# Patient Record
Sex: Male | Born: 1935 | Race: Black or African American | Hispanic: No | Marital: Single | Smoking: Former smoker
Health system: Southern US, Community
[De-identification: ages and names within clinical notes are randomized; demographics above are authoritative.]

## PROBLEM LIST (undated history)

## (undated) DIAGNOSIS — D649 Anemia, unspecified: Secondary | ICD-10-CM

## (undated) DIAGNOSIS — A048 Other specified bacterial intestinal infections: Secondary | ICD-10-CM

## (undated) DIAGNOSIS — H409 Unspecified glaucoma: Secondary | ICD-10-CM

## (undated) DIAGNOSIS — Z9289 Personal history of other medical treatment: Secondary | ICD-10-CM

## (undated) DIAGNOSIS — I10 Essential (primary) hypertension: Secondary | ICD-10-CM

## (undated) DIAGNOSIS — E119 Type 2 diabetes mellitus without complications: Secondary | ICD-10-CM

## (undated) DIAGNOSIS — G4730 Sleep apnea, unspecified: Secondary | ICD-10-CM

## (undated) DIAGNOSIS — H25019 Cortical age-related cataract, unspecified eye: Secondary | ICD-10-CM

## (undated) DIAGNOSIS — E785 Hyperlipidemia, unspecified: Secondary | ICD-10-CM

## (undated) DIAGNOSIS — N400 Enlarged prostate without lower urinary tract symptoms: Secondary | ICD-10-CM

## (undated) DIAGNOSIS — K5520 Angiodysplasia of colon without hemorrhage: Secondary | ICD-10-CM

## (undated) DIAGNOSIS — K269 Duodenal ulcer, unspecified as acute or chronic, without hemorrhage or perforation: Secondary | ICD-10-CM

## (undated) DIAGNOSIS — J309 Allergic rhinitis, unspecified: Secondary | ICD-10-CM

## (undated) DIAGNOSIS — K635 Polyp of colon: Secondary | ICD-10-CM

## (undated) DIAGNOSIS — Z8719 Personal history of other diseases of the digestive system: Secondary | ICD-10-CM

## (undated) HISTORY — DX: Unspecified glaucoma: H40.9

## (undated) HISTORY — DX: Personal history of other diseases of the digestive system: Z87.19

## (undated) HISTORY — DX: Polyp of colon: K63.5

## (undated) HISTORY — DX: Sleep apnea, unspecified: G47.30

## (undated) HISTORY — DX: Duodenal ulcer, unspecified as acute or chronic, without hemorrhage or perforation: K26.9

## (undated) HISTORY — DX: Allergic rhinitis, unspecified: J30.9

## (undated) HISTORY — DX: Other specified bacterial intestinal infections: A04.8

## (undated) HISTORY — DX: Hyperlipidemia, unspecified: E78.5

## (undated) HISTORY — DX: Essential (primary) hypertension: I10

## (undated) HISTORY — DX: Type 2 diabetes mellitus without complications: E11.9

## (undated) HISTORY — DX: Benign prostatic hyperplasia without lower urinary tract symptoms: N40.0

## (undated) HISTORY — DX: Angiodysplasia of colon without hemorrhage: K55.20

## (undated) HISTORY — DX: Personal history of other medical treatment: Z92.89

## (undated) HISTORY — DX: Anemia, unspecified: D64.9

## (undated) HISTORY — PX: COLONOSCOPY: SHX174

## (undated) HISTORY — DX: Cortical age-related cataract, unspecified eye: H25.019

## (undated) HISTORY — PX: ESOPHAGOGASTRODUODENOSCOPY: SHX1529

## (undated) MED FILL — Iron Sucrose Inj 20 MG/ML (Fe Equiv): INTRAVENOUS | Qty: 10 | Status: AC

---

## 2005-02-07 DIAGNOSIS — Z9289 Personal history of other medical treatment: Secondary | ICD-10-CM

## 2005-02-07 DIAGNOSIS — Z8719 Personal history of other diseases of the digestive system: Secondary | ICD-10-CM

## 2005-02-07 HISTORY — DX: Personal history of other medical treatment: Z92.89

## 2005-02-07 HISTORY — DX: Personal history of other diseases of the digestive system: Z87.19

## 2005-02-14 ENCOUNTER — Inpatient Hospital Stay: Payer: Self-pay | Admitting: Internal Medicine

## 2005-03-22 ENCOUNTER — Ambulatory Visit: Payer: Self-pay | Admitting: Unknown Physician Specialty

## 2005-06-06 ENCOUNTER — Ambulatory Visit: Payer: Self-pay | Admitting: Family Medicine

## 2005-06-07 ENCOUNTER — Ambulatory Visit: Payer: Self-pay | Admitting: Family Medicine

## 2005-07-07 ENCOUNTER — Ambulatory Visit: Payer: Self-pay | Admitting: Family Medicine

## 2005-08-07 ENCOUNTER — Ambulatory Visit: Payer: Self-pay | Admitting: Family Medicine

## 2005-09-07 ENCOUNTER — Ambulatory Visit: Payer: Self-pay | Admitting: Family Medicine

## 2010-07-16 ENCOUNTER — Ambulatory Visit: Payer: Self-pay | Admitting: Unknown Physician Specialty

## 2010-08-17 ENCOUNTER — Ambulatory Visit: Payer: Self-pay | Admitting: Unknown Physician Specialty

## 2011-11-05 ENCOUNTER — Ambulatory Visit: Payer: Self-pay | Admitting: Nurse Practitioner

## 2011-12-26 ENCOUNTER — Ambulatory Visit: Payer: Self-pay | Admitting: Nurse Practitioner

## 2015-01-04 ENCOUNTER — Other Ambulatory Visit: Payer: Self-pay | Admitting: Family Medicine

## 2015-01-04 DIAGNOSIS — R6881 Early satiety: Secondary | ICD-10-CM

## 2015-01-06 ENCOUNTER — Ambulatory Visit
Admission: RE | Admit: 2015-01-06 | Discharge: 2015-01-06 | Disposition: A | Discharge status: Home / Self Care | Payer: Medicare Other | Source: Ambulatory Visit | Attending: Family Medicine | Admitting: Family Medicine

## 2015-01-06 DIAGNOSIS — R6881 Early satiety: Secondary | ICD-10-CM | POA: Insufficient documentation

## 2015-01-17 ENCOUNTER — Inpatient Hospital Stay: Payer: Medicare Other

## 2015-01-17 ENCOUNTER — Other Ambulatory Visit: Payer: Self-pay | Admitting: *Deleted

## 2015-01-17 ENCOUNTER — Encounter: Payer: Self-pay | Admitting: Internal Medicine

## 2015-01-17 ENCOUNTER — Inpatient Hospital Stay: Payer: Medicare Other | Attending: Internal Medicine | Admitting: Internal Medicine

## 2015-01-17 VITALS — BP 150/73 | HR 80 | Temp 97.8°F | Ht 66.0 in | Wt 185.8 lb

## 2015-01-17 DIAGNOSIS — Z8719 Personal history of other diseases of the digestive system: Secondary | ICD-10-CM | POA: Diagnosis not present

## 2015-01-17 DIAGNOSIS — D500 Iron deficiency anemia secondary to blood loss (chronic): Secondary | ICD-10-CM | POA: Insufficient documentation

## 2015-01-17 DIAGNOSIS — Z7984 Long term (current) use of oral hypoglycemic drugs: Secondary | ICD-10-CM | POA: Insufficient documentation

## 2015-01-17 DIAGNOSIS — E785 Hyperlipidemia, unspecified: Secondary | ICD-10-CM | POA: Diagnosis not present

## 2015-01-17 DIAGNOSIS — N400 Enlarged prostate without lower urinary tract symptoms: Secondary | ICD-10-CM | POA: Insufficient documentation

## 2015-01-17 DIAGNOSIS — C649 Malignant neoplasm of unspecified kidney, except renal pelvis: Secondary | ICD-10-CM | POA: Diagnosis not present

## 2015-01-17 DIAGNOSIS — K922 Gastrointestinal hemorrhage, unspecified: Secondary | ICD-10-CM | POA: Diagnosis not present

## 2015-01-17 DIAGNOSIS — E119 Type 2 diabetes mellitus without complications: Secondary | ICD-10-CM | POA: Diagnosis not present

## 2015-01-17 DIAGNOSIS — R5383 Other fatigue: Secondary | ICD-10-CM | POA: Diagnosis not present

## 2015-01-17 DIAGNOSIS — Z87891 Personal history of nicotine dependence: Secondary | ICD-10-CM | POA: Insufficient documentation

## 2015-01-17 DIAGNOSIS — I10 Essential (primary) hypertension: Secondary | ICD-10-CM | POA: Insufficient documentation

## 2015-01-17 DIAGNOSIS — D649 Anemia, unspecified: Secondary | ICD-10-CM | POA: Diagnosis not present

## 2015-01-17 DIAGNOSIS — Z79899 Other long term (current) drug therapy: Secondary | ICD-10-CM | POA: Insufficient documentation

## 2015-01-17 DIAGNOSIS — R0609 Other forms of dyspnea: Secondary | ICD-10-CM | POA: Insufficient documentation

## 2015-01-17 DIAGNOSIS — G4730 Sleep apnea, unspecified: Secondary | ICD-10-CM | POA: Insufficient documentation

## 2015-01-17 LAB — URINALYSIS COMPLETE WITH MICROSCOPIC (ARMC ONLY)
Bilirubin Urine: NEGATIVE
GLUCOSE, UA: NEGATIVE mg/dL
Hgb urine dipstick: NEGATIVE
KETONES UR: NEGATIVE mg/dL
LEUKOCYTES UA: NEGATIVE
NITRITE: NEGATIVE
Protein, ur: NEGATIVE mg/dL
SPECIFIC GRAVITY, URINE: 1.02 (ref 1.005–1.030)
Squamous Epithelial / LPF: NONE SEEN
pH: 5.5 (ref 5.0–8.0)

## 2015-01-17 LAB — COMPREHENSIVE METABOLIC PANEL
ALBUMIN: 3.9 g/dL (ref 3.5–5.0)
ALT: 12 U/L — ABNORMAL LOW (ref 17–63)
ANION GAP: 6 (ref 5–15)
AST: 21 U/L (ref 15–41)
Alkaline Phosphatase: 60 U/L (ref 38–126)
BILIRUBIN TOTAL: 0.5 mg/dL (ref 0.3–1.2)
BUN: 17 mg/dL (ref 6–20)
CO2: 27 mmol/L (ref 22–32)
Calcium: 9.5 mg/dL (ref 8.9–10.3)
Chloride: 105 mmol/L (ref 101–111)
Creatinine, Ser: 1.19 mg/dL (ref 0.61–1.24)
GFR calc Af Amer: >60 mL/min (ref >60)
GFR, EST NON AFRICAN AMERICAN: 56 mL/min — AB (ref >60)
GLUCOSE: 133 mg/dL — AB (ref 65–99)
POTASSIUM: 3.7 mmol/L (ref 3.5–5.1)
Sodium: 138 mmol/L (ref 135–145)
TOTAL PROTEIN: 7.8 g/dL (ref 6.5–8.1)

## 2015-01-17 LAB — IRON AND TIBC
Iron: 27 ug/dL — ABNORMAL LOW (ref 45–182)
Saturation Ratios: 6 % — ABNORMAL LOW (ref 17.9–39.5)
TIBC: 449 ug/dL (ref 250–450)
UIBC: 422 ug/dL

## 2015-01-17 LAB — CBC WITH DIFFERENTIAL/PLATELET
Basophils Absolute: 0.1 10*3/uL (ref 0–0.1)
Eosinophils Absolute: 0.1 10*3/uL (ref 0–0.7)
Eosinophils Relative: 2 %
HEMATOCRIT: 28.4 % — AB (ref 40.0–52.0)
Hemoglobin: 8.8 g/dL — ABNORMAL LOW (ref 13.0–18.0)
Lymphocytes Relative: 14 %
Lymphs Abs: 0.9 10*3/uL — ABNORMAL LOW (ref 1.0–3.6)
MCH: 23.2 pg — ABNORMAL LOW (ref 26.0–34.0)
MCHC: 31.1 g/dL — AB (ref 32.0–36.0)
MCV: 74.6 fL — ABNORMAL LOW (ref 80.0–100.0)
MONO ABS: 0.6 10*3/uL (ref 0.2–1.0)
NEUTROS ABS: 4.8 10*3/uL (ref 1.4–6.5)
Neutrophils Relative %: 73 %
Platelets: 314 10*3/uL (ref 150–440)
RBC: 3.81 MIL/uL — ABNORMAL LOW (ref 4.40–5.90)
RDW: 16.7 % — AB (ref 11.5–14.5)
WBC: 6.5 10*3/uL (ref 3.8–10.6)

## 2015-01-17 LAB — FOLATE: FOLATE: 6.1 ng/mL (ref >5.9)

## 2015-01-17 LAB — RETICULOCYTES
RBC.: 3.73 MIL/uL — AB (ref 4.40–5.90)
RETIC COUNT ABSOLUTE: 63.4 10*3/uL (ref 19.0–183.0)
RETIC CT PCT: 1.7 % (ref 0.4–3.1)

## 2015-01-17 LAB — LACTATE DEHYDROGENASE: LDH: 101 U/L (ref 98–192)

## 2015-01-17 LAB — FERRITIN: Ferritin: 7 ng/mL — ABNORMAL LOW (ref 24–336)

## 2015-01-17 NOTE — Progress Notes (Signed)
[facility] CONSULT NOTE  Patient Care Team: Sherrin Daisy, MD as PCP - General (Family Medicine)  CHIEF COMPLAINTS/PURPOSE OF CONSULTATION: ANEMIA # 2009-Intermittent ANEMIA- likely secondary to upper GI bleed; EGD [gastric ulcer & duodenal AV malformations] & Colonoscopy 2007&  2012 [Dr.Elliot]  HISTORY OF PRESENTING ILLNESS:  Tommy Heath 82 y.o.  male with prior history of upper GI bleed secondary to gastric ulcer/duodenal AV malformation [2007 and 2012 status post EGD] has been referred to [place] for worsening anemia. Patient had received blood transfusion in 2007 as his hemoglobin was as low as 6 at that time.  Patient has been on one iron pill a day. He has black colored stools chronic. He denies abdominal pain or reflux problems. Denies any bright red blood per rectum. No nausea no vomiting. No significant weight loss. No night sweats. No unusual chest pain or cough. He has worsening shortness of breath especially exertion. No swelling in the legs.  More recent Review of patient's blood counts shows- hemoglobin 11-18 June 2013; which has progressively gotten worse more recently in December 2016 hemoglobin dropped to 9. MCV-within normal limits. Normal white count and platelets. Patient has been referred to [place] for further recommendation/evaluation.  ROS: A complete 10 point review of system is done which is negative except mentioned above in history of present illness  MEDICAL HISTORY:  Past Medical History  Diagnosis Date  . Diabetes mellitus type 2, uncomplicated ([place])   . Hypertension   . Hyperlipidemia   . BPH (benign prostatic hyperplasia)   . Duodenal ulcer     w/ gastric ulcer  . H. pylori infection   . AVM (arteriovenous malformation) of colon   . Anemia   . Allergic rhinitis   . Adenomatous colon polyp   . Sleep apnea     BiPap  . Glaucoma   . History of blood product transfusion   . Cataract cortical, senile   . Colon polyps 02/2005, 2012    TUBULAR  ADENOMA  . History of blood transfusion 02/2005    hgb was 6  . History of duodenal ulcer 02/2005    The lesion was 5 mm in largest dimension.    SURGICAL HISTORY: Past Surgical History  Procedure Laterality Date  . Colonoscopy      07/2010, 09/09/2002, 10/15/2002, 02/16/2005, Adenomatous polyps, repeat 5 years, Dr Vira Agar  . Esophagogastroduodenoscopy      08/17/2010, 02/16/2005, no repeat per RTE    SOCIAL HISTORY: Social History   Social History  . Marital Status: Single    Spouse Name: N/A  . Number of Children: N/A  . Years of Education: N/A   Occupational History  . Not on file.   Social History Main Topics  . Smoking status: Former Smoker -- 1.00 packs/day for 40 years    Types: Cigarettes    Quit date: 01/07/1993  . Smokeless tobacco: Never Used  . Alcohol Use: No  . Drug Use: No  . Sexual Activity: No   Other Topics Concern  . Not on file   Social History Narrative  . No narrative on file    FAMILY HISTORY: History reviewed. No pertinent family history.  ALLERGIES:  has No Known Allergies.  MEDICATIONS:  Current Outpatient Prescriptions  Medication Sig Dispense Refill  . amLODipine (NORVASC) 10 MG tablet TAKE 1 TABLET DAILY    . atorvastatin (LIPITOR) 80 MG tablet TAKE 1 TABLET NIGHTLY    . Cyanocobalamin (RA VITAMIN B-12 TR) 1000 MCG TBCR Take by  mouth.    . ferrous sulfate 325 (65 FE) MG tablet Take by mouth.    Marland Kitchen glimepiride (AMARYL) 2 MG tablet TAKE 1 TABLET DAILY WITH BREAKFAST    . metFORMIN (GLUCOPHAGE) 1000 MG tablet Take by mouth.    Marland Kitchen omeprazole (PRILOSEC) 20 MG capsule TAKE 1 CAPSULE DAILY (CALL FOR APPOINTMENT)    . pioglitazone (ACTOS) 15 MG tablet TAKE 1 TABLET DAILY    . ramipril (ALTACE) 10 MG capsule TAKE 1 CAPSULE DAILY    . sitaGLIPtin (JANUVIA) 100 MG tablet Take by mouth.    . tamsulosin (FLOMAX) 0.4 MG CAPS capsule TAKE 1 CAPSULE DAILY    . triamcinolone (KENALOG) 0.025 % cream Apply topically.     No current facility-administered  medications for this visit.      Marland Kitchen  PHYSICAL EXAMINATION: ECOG PERFORMANCE STATUS: 1 - Symptomatic but completely ambulatory  Filed Vitals:   01/17/15 1358  BP: 150/73  Pulse: 80  Temp: 97.8 F (36.6 C)   Filed Weights   01/17/15 1358  Weight: 185 lb 13.6 oz (84.3 kg)    GENERAL: Well-nourished well-developed; Alert, no distress and comfortable.   Alone.  EYES: Positive for pallor no icterus. OROPHARYNX: no thrush or ulceration; good dentition  NECK: supple, no masses felt LYMPH:  no palpable lymphadenopathy in the cervical, axillary or inguinal regions LUNGS: clear to auscultation and  No wheeze or crackles HEART/CVS: regular rate & rhythm and no murmurs; No lower extremity edema ABDOMEN: abdomen soft, non-tender and normal bowel sounds Musculoskeletal:no cyanosis of digits and no clubbing  PSYCH: alert & oriented x 3 with fluent speech NEURO: no focal motor/sensory deficits SKIN:  no rashes or significant lesions  LABORATORY DATA:  I have reviewed the data as listed No results found for: WBC, HGB, HCT, MCV, PLT No results for input(s): NA, K, CL, CO2, GLUCOSE, BUN, CREATININE, CALCIUM, GFRNONAA, GFRAA, PROT, ALBUMIN, AST, ALT, ALKPHOS, BILITOT, BILIDIR, IBILI in the last 8760 hours.  RADIOGRAPHIC STUDIES: I have personally reviewed the radiological images as listed and agreed with the findings in the report. Dg Esophagus  01/06/2015  CLINICAL DATA:  Early satiety. EXAM: ESOPHOGRAM / BARIUM SWALLOW / BARIUM TABLET STUDY TECHNIQUE: Combined double contrast and single contrast examination performed using effervescent crystals, thick barium liquid, and thin barium liquid. The patient was observed with fluoroscopy swallowing a 13 mm barium sulphate tablet. FLUOROSCOPY TIME:  Radiation Exposure Index (as provided by the fluoroscopic device): 10.7 mGy COMPARISON:  None. FINDINGS: There was normal pharyngeal anatomy and motility. Contrast flowed freely through the esophagus  without evidence of stricture or mass. There was normal esophageal mucosa without evidence of irregularity or ulceration. Esophageal motility was normal. No evidence of reflux. No definite hiatal hernia was demonstrated. At the end of the examination a 13 mm barium tablet was administered which transited through the esophagus and esophagogastric junction without delay. IMPRESSION: Normal barium swallow. Electronically Signed   By: Kathreen Devoid   On: 01/06/2015 15:21    ASSESSMENT & PLAN:   # Worsening anemia- more recently hemoglobin of 9 MCV normal. I suspect patient has recurrent chronic GI bleeding issues. However other causes [place] to rule out. I recommend checking CBC CMP LDH and reticulocyte count [id number] folic acid; also SIEP/free light chain ratio haptoglobin.  # If patient has truly iron deficiency- I would recommend IV iron infusion. Tentatively order for Venofer 300 mg over 90 minutes has been ordered for next week.  # If again iron deficiency is  confirmed- patient is to have an evaluation with GI/for repeat/possible endoscopies.  All questions were answered. The patient knows to call the clinic with any problems, questions or concerns. Patient will follow-up with me in approximately 1 week.  # 30 minutes face-to-face with the patient discussing the above plan of care; more than 50% of time spent on prognosis/ natural history; counseling and coordination.   Thank you Dr.Virk for allowing me to participate in the care of your pleasant patient. Please do not hesitate to contact me with questions or concerns in the interim.     Cammie Sickle, MD 01/17/2015 2:43 PM

## 2015-01-17 NOTE — Progress Notes (Signed)
Family/social history-  No history of any malignancies in the family.  Remote history of smoking.  No alcohol abuse. Patient  Lives at home.

## 2015-01-18 LAB — IMMUNOFIXATION ELECTROPHORESIS
IGM, SERUM: 73 mg/dL (ref 15–143)
IgA: 252 mg/dL (ref 61–437)
IgG (Immunoglobin G), Serum: 1492 mg/dL (ref 700–1600)
Total Protein ELP: 7 g/dL (ref 6.0–8.5)

## 2015-01-18 LAB — MULTIPLE MYELOMA PANEL, SERUM
ALBUMIN SERPL ELPH-MCNC: 3.4 g/dL (ref 2.9–4.4)
ALPHA 1: 0.2 g/dL (ref 0.0–0.4)
Albumin/Glob SerPl: 1.1 (ref 0.7–1.7)
Alpha2 Glob SerPl Elph-Mcnc: 0.7 g/dL (ref 0.4–1.0)
B-Globulin SerPl Elph-Mcnc: 1 g/dL (ref 0.7–1.3)
GAMMA GLOB SERPL ELPH-MCNC: 1.4 g/dL (ref 0.4–1.8)
Globulin, Total: 3.4 g/dL (ref 2.2–3.9)
IGA: 251 mg/dL (ref 61–437)
IGM, SERUM: 71 mg/dL (ref 15–143)
IgG (Immunoglobin G), Serum: 1462 mg/dL (ref 700–1600)
TOTAL PROTEIN ELP: 6.8 g/dL (ref 6.0–8.5)

## 2015-01-18 LAB — SOLUBLE TRANSFERRIN RECEPTOR: TRANSFERRIN RECEPTOR: 48.8 nmol/L — AB (ref 12.2–27.3)

## 2015-01-18 LAB — VITAMIN B12: VITAMIN B 12: 574 pg/mL (ref 180–914)

## 2015-01-18 LAB — KAPPA/LAMBDA LIGHT CHAINS
KAPPA FREE LGHT CHN: 37.2 mg/L — AB (ref 3.30–19.40)
KAPPA, LAMDA LIGHT CHAIN RATIO: 1.44 (ref 0.26–1.65)
LAMDA FREE LIGHT CHAINS: 25.76 mg/L (ref 5.71–26.30)

## 2015-01-18 LAB — HAPTOGLOBIN: Haptoglobin: 226 mg/dL — ABNORMAL HIGH (ref 34–200)

## 2015-01-24 ENCOUNTER — Inpatient Hospital Stay: Payer: Medicare Other

## 2015-01-24 ENCOUNTER — Inpatient Hospital Stay (HOSPITAL_BASED_OUTPATIENT_CLINIC_OR_DEPARTMENT_OTHER): Payer: Medicare Other | Admitting: Internal Medicine

## 2015-01-24 VITALS — BP 157/68 | HR 79 | Temp 98.1°F | Resp 18 | Wt 185.8 lb

## 2015-01-24 VITALS — BP 143/71 | HR 67 | Temp 96.7°F | Resp 20

## 2015-01-24 DIAGNOSIS — K922 Gastrointestinal hemorrhage, unspecified: Secondary | ICD-10-CM | POA: Diagnosis not present

## 2015-01-24 DIAGNOSIS — N400 Enlarged prostate without lower urinary tract symptoms: Secondary | ICD-10-CM

## 2015-01-24 DIAGNOSIS — D509 Iron deficiency anemia, unspecified: Secondary | ICD-10-CM

## 2015-01-24 DIAGNOSIS — Z87891 Personal history of nicotine dependence: Secondary | ICD-10-CM

## 2015-01-24 DIAGNOSIS — D500 Iron deficiency anemia secondary to blood loss (chronic): Secondary | ICD-10-CM

## 2015-01-24 DIAGNOSIS — Z79899 Other long term (current) drug therapy: Secondary | ICD-10-CM

## 2015-01-24 DIAGNOSIS — E785 Hyperlipidemia, unspecified: Secondary | ICD-10-CM

## 2015-01-24 DIAGNOSIS — R0609 Other forms of dyspnea: Secondary | ICD-10-CM

## 2015-01-24 DIAGNOSIS — E119 Type 2 diabetes mellitus without complications: Secondary | ICD-10-CM

## 2015-01-24 DIAGNOSIS — R5383 Other fatigue: Secondary | ICD-10-CM | POA: Diagnosis not present

## 2015-01-24 DIAGNOSIS — I10 Essential (primary) hypertension: Secondary | ICD-10-CM

## 2015-01-24 DIAGNOSIS — Z7984 Long term (current) use of oral hypoglycemic drugs: Secondary | ICD-10-CM

## 2015-01-24 MED ORDER — SODIUM CHLORIDE 0.9 % IV SOLN
300.0000 mg | Freq: Once | INTRAVENOUS | Status: AC
  Administered 2015-01-24: 300 mg via INTRAVENOUS
  Filled 2015-01-24: qty 15

## 2015-01-24 MED ORDER — SODIUM CHLORIDE 0.9 % IV SOLN
Freq: Once | INTRAVENOUS | Status: AC
  Administered 2015-01-24: 09:00:00 via INTRAVENOUS
  Filled 2015-01-24: qty 1000

## 2015-01-24 NOTE — Progress Notes (Signed)
Pt here for lab results and to receive venofer as hgb is 8.8 and ferritin is 7. Pt reports low energy and low appetite. Fast food odors make him feel sick. He is able to eat meats and greens, he states he "makes himself eat". Denies blood in stools. He has freq diarrhea from his metformin. Denies dyspnea. Denies pain.Marland Kitchen

## 2015-01-24 NOTE — Progress Notes (Signed)
[facility] OFFICE PROGRESS NOTE  Patient Care Team: Sherrin Daisy, MD as PCP - General (Family Medicine)   SUMMARY OF ONCOLOGIC HISTORY:  # 2009- IRON DEFICIENCY ANEMIA-likely secondary to upper GI bleed; EGD [gastric ulcer & duodenal AV malformations] & Colonoscopy 2007& 2012 [Dr.Elliot]; JAN 2017- Hb-8.8/Ferritin-7  INTERVAL HISTORY:  82 year old male patient with with above history of iron deficiency anemia likely from chronic GI blood loss is here to review the results of his repeat blood work that was done last visit for worsening anemia.  He continues to feel tired; short of breath on exertion. Denies any swelling in the legs. Denies any cough or nausea vomiting. Denies any blood in stools. He has black stools [on by mouth iron]. No abdominal pain or weight loss.  REVIEW OF SYSTEMS:  A complete 10 point review of system is done which is negative except mentioned above/history of present illness.   PAST MEDICAL HISTORY :  Past Medical History  Diagnosis Date  . Diabetes mellitus type 2, uncomplicated ([place])   . Hypertension   . Hyperlipidemia   . BPH (benign prostatic hyperplasia)   . Duodenal ulcer     w/ gastric ulcer  . H. pylori infection   . AVM (arteriovenous malformation) of colon   . Anemia   . Allergic rhinitis   . Adenomatous colon polyp   . Sleep apnea     BiPap  . Glaucoma   . History of blood product transfusion   . Cataract cortical, senile   . Colon polyps 02/2005, 2012    TUBULAR ADENOMA  . History of blood transfusion 02/2005    hgb was 6  . History of duodenal ulcer 02/2005    The lesion was 5 mm in largest dimension.    PAST SURGICAL HISTORY :   Past Surgical History  Procedure Laterality Date  . Colonoscopy      07/2010, 09/09/2002, 10/15/2002, 02/16/2005, Adenomatous polyps, repeat 5 years, Dr Vira Agar  . Esophagogastroduodenoscopy      08/17/2010, 02/16/2005, no repeat per RTE    FAMILY HISTORY :  No family history on  file.  SOCIAL HISTORY:   Social History  Substance Use Topics  . Smoking status: Former Smoker -- 1.00 packs/day for 40 years    Types: Cigarettes    Quit date: 01/07/1993  . Smokeless tobacco: Never Used  . Alcohol Use: No    ALLERGIES:  has No Known Allergies.  MEDICATIONS:  Current Outpatient Prescriptions  Medication Sig Dispense Refill  . amLODipine (NORVASC) 10 MG tablet TAKE 1 TABLET DAILY    . atorvastatin (LIPITOR) 80 MG tablet TAKE 1 TABLET NIGHTLY    . Cyanocobalamin (RA VITAMIN B-12 TR) 1000 MCG TBCR Take by mouth.    . ferrous sulfate 325 (65 FE) MG tablet Take by mouth.    Marland Kitchen glimepiride (AMARYL) 2 MG tablet TAKE 1 TABLET DAILY WITH BREAKFAST    . metFORMIN (GLUCOPHAGE) 1000 MG tablet Take by mouth.    Marland Kitchen omeprazole (PRILOSEC) 20 MG capsule TAKE 1 CAPSULE DAILY (CALL FOR APPOINTMENT)    . pioglitazone (ACTOS) 15 MG tablet TAKE 1 TABLET DAILY    . ramipril (ALTACE) 10 MG capsule TAKE 1 CAPSULE DAILY    . sitaGLIPtin (JANUVIA) 100 MG tablet Take by mouth.    . tamsulosin (FLOMAX) 0.4 MG CAPS capsule TAKE 1 CAPSULE DAILY    . triamcinolone (KENALOG) 0.025 % cream Apply topically.     No current facility-administered medications for this visit.  Facility-Administered Medications Ordered in Other Visits  Medication Dose Route Frequency Provider Last Rate Last Dose  . 0.9 %  sodium chloride infusion   Intravenous Once Cammie Sickle, MD      . iron sucrose (VENOFER) 300 mg in sodium chloride 0.9 % 250 mL IVPB  300 mg Intravenous Once Cammie Sickle, MD        PHYSICAL EXAMINATION:   BP 157/68 mmHg  Pulse 79  Temp(Src) 98.1 F (36.7 C) (Oral)  Resp 18  Wt 185 lb 13.6 oz (84.3 kg)  SpO2 99%  Filed Weights   01/24/15 0841  Weight: 185 lb 13.6 oz (84.3 kg)    GENERAL: Well-nourished well-developed; Alert, no distress and comfortable.   He appears pale.  LABORATORY DATA:  I have reviewed the data as listed    Component Value Date/Time   NA 138  01/17/2015 1439   K 3.7 01/17/2015 1439   CL 105 01/17/2015 1439   CO2 27 01/17/2015 1439   GLUCOSE 133* 01/17/2015 1439   BUN 17 01/17/2015 1439   CREATININE 1.19 01/17/2015 1439   CALCIUM 9.5 01/17/2015 1439   PROT 7.8 01/17/2015 1439   ALBUMIN 3.9 01/17/2015 1439   AST 21 01/17/2015 1439   ALT 12* 01/17/2015 1439   ALKPHOS 60 01/17/2015 1439   BILITOT 0.5 01/17/2015 1439   GFRNONAA 56* 01/17/2015 1439   GFRAA >60 01/17/2015 1439    No results found for: SPEP, UPEP  Lab Results  Component Value Date   WBC 6.5 01/17/2015   NEUTROABS 4.8 01/17/2015   HGB 8.8* 01/17/2015   HCT 28.4* 01/17/2015   MCV 74.6* 01/17/2015   PLT 314 01/17/2015      Chemistry      Component Value Date/Time   NA 138 01/17/2015 1439   K 3.7 01/17/2015 1439   CL 105 01/17/2015 1439   CO2 27 01/17/2015 1439   BUN 17 01/17/2015 1439   CREATININE 1.19 01/17/2015 1439      Component Value Date/Time   CALCIUM 9.5 01/17/2015 1439   ALKPHOS 60 01/17/2015 1439   AST 21 01/17/2015 1439   ALT 12* 01/17/2015 1439   BILITOT 0.5 01/17/2015 1439       RADIOGRAPHIC STUDIES: I have personally reviewed the radiological images as listed and agreed with the findings in the report. No results found.   ASSESSMENT & PLAN:   # Severe Iron deficiency Anemia- likely from chronic GI blood loss. Previous history of gastric ulcer/duodenal AV malformations. Patient's hemoglobin is 8.8 ferritin 7; I would recommend IV iron transfusion Venofer weekly 4. Repeat CBC in 4 weeks; follow up with me in 8 weeks with CBC ferritin.   # I have asked the patient to call his gastroenterologist Dr. Tiffany Kocher to make an appointment for further evaluation of his iron deficiency anemia. I have paged Dr. Tiffany Kocher to speak to him regarding the above iron deficiency/Need for further workup.  Orders Placed This Encounter  Procedures  . Ambulatory referral to Gastroenterology    Referral Priority:  Urgent    Referral Type:  Consultation     Referral Reason:  Specialty Services Required    Referred to Provider:  Manya Silvas, MD    Requested Specialty:  Gastroenterology    Number of Visits Requested:  1   # 15 minutes face-to-face with the patient discussing the above plan of care; more than 50% of time spent on prognosis/ natural history; counseling and coordination.     Lenetta Quaker  Ann Lions, MD 01/24/2015 8:50 AM

## 2015-01-24 NOTE — Progress Notes (Signed)
Spoke to [place] re: IDA; he will follow. Dr.B

## 2015-01-31 ENCOUNTER — Other Ambulatory Visit: Payer: Self-pay | Admitting: Internal Medicine

## 2015-01-31 ENCOUNTER — Inpatient Hospital Stay: Payer: Medicare Other

## 2015-01-31 VITALS — BP 133/63 | HR 69 | Temp 97.5°F | Resp 20

## 2015-01-31 DIAGNOSIS — D500 Iron deficiency anemia secondary to blood loss (chronic): Secondary | ICD-10-CM

## 2015-01-31 MED ORDER — SODIUM CHLORIDE 0.9 % IV SOLN
300.0000 mg | Freq: Once | INTRAVENOUS | Status: AC
  Administered 2015-01-31: 300 mg via INTRAVENOUS
  Filled 2015-01-31: qty 15

## 2015-01-31 MED ORDER — SODIUM CHLORIDE 0.9 % IV SOLN
Freq: Once | INTRAVENOUS | Status: AC
  Administered 2015-01-31: 10:00:00 via INTRAVENOUS
  Filled 2015-01-31: qty 1000

## 2015-02-07 ENCOUNTER — Inpatient Hospital Stay: Payer: Medicare Other

## 2015-02-07 VITALS — BP 136/64 | HR 65 | Temp 97.7°F | Resp 20

## 2015-02-07 DIAGNOSIS — D500 Iron deficiency anemia secondary to blood loss (chronic): Secondary | ICD-10-CM

## 2015-02-07 MED ORDER — SODIUM CHLORIDE 0.9 % IV SOLN
300.0000 mg | Freq: Once | INTRAVENOUS | Status: AC
  Administered 2015-02-07: 300 mg via INTRAVENOUS
  Filled 2015-02-07: qty 15

## 2015-02-07 MED ORDER — SODIUM CHLORIDE 0.9 % IV SOLN
Freq: Once | INTRAVENOUS | Status: AC
  Administered 2015-02-07: 11:00:00 via INTRAVENOUS
  Filled 2015-02-07: qty 1000

## 2015-02-14 ENCOUNTER — Inpatient Hospital Stay: Payer: Medicare Other | Attending: Internal Medicine

## 2015-02-14 ENCOUNTER — Inpatient Hospital Stay: Payer: Medicare Other

## 2015-02-14 VITALS — BP 123/57 | HR 81 | Temp 97.4°F

## 2015-02-14 DIAGNOSIS — D500 Iron deficiency anemia secondary to blood loss (chronic): Secondary | ICD-10-CM

## 2015-02-14 DIAGNOSIS — D509 Iron deficiency anemia, unspecified: Secondary | ICD-10-CM | POA: Insufficient documentation

## 2015-02-14 LAB — CBC WITH DIFFERENTIAL/PLATELET
BASOS ABS: 0.1 10*3/uL (ref 0–0.1)
Basophils Relative: 1 %
EOS ABS: 0.1 10*3/uL (ref 0–0.7)
EOS PCT: 2 %
HCT: 31.4 % — ABNORMAL LOW (ref 40.0–52.0)
Hemoglobin: 9.9 g/dL — ABNORMAL LOW (ref 13.0–18.0)
Lymphocytes Relative: 20 %
Lymphs Abs: 1.4 10*3/uL (ref 1.0–3.6)
MCH: 24.6 pg — ABNORMAL LOW (ref 26.0–34.0)
MCHC: 31.5 g/dL — ABNORMAL LOW (ref 32.0–36.0)
MCV: 78.1 fL — AB (ref 80.0–100.0)
MONO ABS: 0.6 10*3/uL (ref 0.2–1.0)
Monocytes Relative: 8 %
Neutro Abs: 5 10*3/uL (ref 1.4–6.5)
Neutrophils Relative %: 69 %
PLATELETS: 321 10*3/uL (ref 150–440)
RBC: 4.02 MIL/uL — AB (ref 4.40–5.90)
RDW: 23.6 % — AB (ref 11.5–14.5)
WBC: 7.2 10*3/uL (ref 3.8–10.6)

## 2015-02-14 MED ORDER — SODIUM CHLORIDE 0.9 % IV SOLN
Freq: Once | INTRAVENOUS | Status: AC
  Administered 2015-02-14: 11:00:00 via INTRAVENOUS
  Filled 2015-02-14: qty 1000

## 2015-02-14 MED ORDER — IRON SUCROSE 20 MG/ML IV SOLN
300.0000 mg | Freq: Once | INTRAVENOUS | Status: AC
  Administered 2015-02-14: 300 mg via INTRAVENOUS
  Filled 2015-02-14: qty 15

## 2015-03-01 ENCOUNTER — Encounter: Payer: Self-pay | Admitting: *Deleted

## 2015-03-01 ENCOUNTER — Ambulatory Visit: Payer: Medicare Other | Admitting: Anesthesiology

## 2015-03-01 ENCOUNTER — Ambulatory Visit
Admission: RE | Admit: 2015-03-01 | Discharge: 2015-03-01 | Disposition: A | Discharge status: Home / Self Care | Payer: Medicare Other | Source: Ambulatory Visit | Attending: Unknown Physician Specialty | Admitting: Unknown Physician Specialty

## 2015-03-01 ENCOUNTER — Encounter
Admission: RE | Disposition: A | Discharge status: Home / Self Care | Payer: Self-pay | Source: Ambulatory Visit | Attending: Unknown Physician Specialty

## 2015-03-01 DIAGNOSIS — Z9889 Other specified postprocedural states: Secondary | ICD-10-CM | POA: Diagnosis not present

## 2015-03-01 DIAGNOSIS — Z79899 Other long term (current) drug therapy: Secondary | ICD-10-CM | POA: Diagnosis not present

## 2015-03-01 DIAGNOSIS — K2970 Gastritis, unspecified, without bleeding: Secondary | ICD-10-CM | POA: Diagnosis not present

## 2015-03-01 DIAGNOSIS — Z7984 Long term (current) use of oral hypoglycemic drugs: Secondary | ICD-10-CM | POA: Diagnosis not present

## 2015-03-01 DIAGNOSIS — Q2733 Arteriovenous malformation of digestive system vessel: Secondary | ICD-10-CM | POA: Diagnosis not present

## 2015-03-01 DIAGNOSIS — N400 Enlarged prostate without lower urinary tract symptoms: Secondary | ICD-10-CM | POA: Insufficient documentation

## 2015-03-01 DIAGNOSIS — K641 Second degree hemorrhoids: Secondary | ICD-10-CM | POA: Insufficient documentation

## 2015-03-01 DIAGNOSIS — Z8719 Personal history of other diseases of the digestive system: Secondary | ICD-10-CM | POA: Insufficient documentation

## 2015-03-01 DIAGNOSIS — K3189 Other diseases of stomach and duodenum: Secondary | ICD-10-CM | POA: Diagnosis not present

## 2015-03-01 DIAGNOSIS — K449 Diaphragmatic hernia without obstruction or gangrene: Secondary | ICD-10-CM | POA: Insufficient documentation

## 2015-03-01 DIAGNOSIS — G4730 Sleep apnea, unspecified: Secondary | ICD-10-CM | POA: Diagnosis not present

## 2015-03-01 DIAGNOSIS — D509 Iron deficiency anemia, unspecified: Secondary | ICD-10-CM | POA: Insufficient documentation

## 2015-03-01 DIAGNOSIS — Z86010 Personal history of colonic polyps: Secondary | ICD-10-CM | POA: Diagnosis not present

## 2015-03-01 DIAGNOSIS — K5520 Angiodysplasia of colon without hemorrhage: Secondary | ICD-10-CM | POA: Diagnosis not present

## 2015-03-01 DIAGNOSIS — D124 Benign neoplasm of descending colon: Secondary | ICD-10-CM | POA: Insufficient documentation

## 2015-03-01 DIAGNOSIS — E785 Hyperlipidemia, unspecified: Secondary | ICD-10-CM | POA: Diagnosis not present

## 2015-03-01 DIAGNOSIS — E119 Type 2 diabetes mellitus without complications: Secondary | ICD-10-CM | POA: Diagnosis not present

## 2015-03-01 DIAGNOSIS — D649 Anemia, unspecified: Secondary | ICD-10-CM | POA: Insufficient documentation

## 2015-03-01 DIAGNOSIS — R197 Diarrhea, unspecified: Secondary | ICD-10-CM | POA: Insufficient documentation

## 2015-03-01 DIAGNOSIS — Z833 Family history of diabetes mellitus: Secondary | ICD-10-CM | POA: Insufficient documentation

## 2015-03-01 DIAGNOSIS — Z87891 Personal history of nicotine dependence: Secondary | ICD-10-CM | POA: Diagnosis not present

## 2015-03-01 DIAGNOSIS — K5730 Diverticulosis of large intestine without perforation or abscess without bleeding: Secondary | ICD-10-CM | POA: Insufficient documentation

## 2015-03-01 DIAGNOSIS — D508 Other iron deficiency anemias: Secondary | ICD-10-CM | POA: Diagnosis present

## 2015-03-01 DIAGNOSIS — I10 Essential (primary) hypertension: Secondary | ICD-10-CM | POA: Diagnosis not present

## 2015-03-01 HISTORY — PX: ESOPHAGOGASTRODUODENOSCOPY (EGD) WITH PROPOFOL: SHX5813

## 2015-03-01 HISTORY — PX: COLONOSCOPY WITH PROPOFOL: SHX5780

## 2015-03-01 LAB — GLUCOSE, CAPILLARY: Glucose-Capillary: 119 mg/dL — ABNORMAL HIGH (ref 65–99)

## 2015-03-01 SURGERY — COLONOSCOPY WITH PROPOFOL
Anesthesia: General

## 2015-03-01 MED ORDER — SODIUM CHLORIDE 0.9 % IV SOLN: INTRAVENOUS | Status: DC

## 2015-03-01 MED ORDER — PROPOFOL 10 MG/ML IV BOLUS
INTRAVENOUS | Status: DC | PRN
  Administered 2015-03-01: 10 mg via INTRAVENOUS
  Administered 2015-03-01: 20 mg via INTRAVENOUS
  Administered 2015-03-01 (×3): 10 mg via INTRAVENOUS
  Administered 2015-03-01: 20 mg via INTRAVENOUS

## 2015-03-01 MED ORDER — BUTAMBEN-TETRACAINE-BENZOCAINE 2-2-14 % EX AERO
INHALATION_SPRAY | CUTANEOUS | Status: DC | PRN
  Administered 2015-03-01: 1 via TOPICAL

## 2015-03-01 MED ORDER — SODIUM CHLORIDE 0.9 % IV SOLN
INTRAVENOUS | Status: DC
  Administered 2015-03-01: 1000 mL via INTRAVENOUS

## 2015-03-01 MED ORDER — PROPOFOL 500 MG/50ML IV EMUL
INTRAVENOUS | Status: DC | PRN
  Administered 2015-03-01: 75 ug/kg/min via INTRAVENOUS

## 2015-03-01 NOTE — Transfer of Care (Signed)
Immediate Anesthesia Transfer of Care Note  Patient: Tommy Heath  Procedure(s) Performed: Procedure(s): COLONOSCOPY WITH PROPOFOL (N/A) ESOPHAGOGASTRODUODENOSCOPY (EGD) WITH PROPOFOL (N/A)  Patient Location: PACU  Anesthesia Type:General  Level of Consciousness: awake, alert  and oriented  Airway & Oxygen Therapy: Patient Spontanous Breathing and Patient connected to nasal cannula oxygen  Post-op Assessment: Report given to RN and Post -op Vital signs reviewed and stable  Post vital signs: Reviewed and stable  Last Vitals:  Filed Vitals:   03/01/15 0850  BP: 144/73  Pulse: 71  Temp: 35.9 C  Resp: 18    Complications: No apparent anesthesia complications

## 2015-03-01 NOTE — H&P (Signed)
Primary Care Physician:  Sherrin Daisy, MD Primary Gastroenterologist:  Dr. Vira Agar  Pre-Procedure History & Physical: HPI:  Tommy Heath is a 82 y.o. male is here for an endoscopy and colonoscopy.   Past Medical History  Diagnosis Date  . Diabetes mellitus type 2, uncomplicated ([place])   . Hypertension   . Hyperlipidemia   . BPH (benign prostatic hyperplasia)   . Duodenal ulcer     w/ gastric ulcer  . H. pylori infection   . AVM (arteriovenous malformation) of colon   . Anemia   . Allergic rhinitis   . Adenomatous colon polyp   . Sleep apnea     BiPap  . Glaucoma   . History of blood product transfusion   . Cataract cortical, senile   . Colon polyps 02/2005, 2012    TUBULAR ADENOMA  . History of blood transfusion 02/2005    hgb was 6  . History of duodenal ulcer 02/2005    The lesion was 5 mm in largest dimension.    Past Surgical History  Procedure Laterality Date  . Colonoscopy      07/2010, 09/09/2002, 10/15/2002, 02/16/2005, Adenomatous polyps, repeat 5 years, Dr Vira Agar  . Esophagogastroduodenoscopy      08/17/2010, 02/16/2005, no repeat per RTE    Prior to Admission medications   Medication Sig Start Date End Date Taking? Authorizing Provider  amLODipine (NORVASC) 10 MG tablet TAKE 1 TABLET DAILY 07/27/14   Historical Provider, MD  atorvastatin (LIPITOR) 80 MG tablet TAKE 1 TABLET NIGHTLY 07/19/14   Historical Provider, MD  Cyanocobalamin (RA VITAMIN B-12 TR) 1000 MCG TBCR Take by mouth.    Historical Provider, MD  ferrous sulfate 325 (65 FE) MG tablet Take by mouth.    Historical Provider, MD  glimepiride (AMARYL) 2 MG tablet TAKE 1 TABLET DAILY WITH BREAKFAST 12/22/14   Historical Provider, MD  metFORMIN (GLUCOPHAGE) 1000 MG tablet Take by mouth. 12/27/14   Historical Provider, MD  omeprazole (PRILOSEC) 20 MG capsule TAKE 1 CAPSULE DAILY (CALL FOR APPOINTMENT) 07/19/14   Historical Provider, MD  pioglitazone (ACTOS) 15 MG tablet TAKE 1 TABLET DAILY 12/12/14   Historical  Provider, MD  ramipril (ALTACE) 10 MG capsule TAKE 1 CAPSULE DAILY 07/27/14   Historical Provider, MD  sitaGLIPtin (JANUVIA) 100 MG tablet Take by mouth. 06/28/14   Historical Provider, MD  tamsulosin (FLOMAX) 0.4 MG CAPS capsule TAKE 1 CAPSULE DAILY 12/08/14   Historical Provider, MD    Allergies as of 02/24/2015  . (No Known Allergies)    History reviewed. No pertinent family history.  Social History   Social History  . Marital Status: Single    Spouse Name: N/A  . Number of Children: N/A  . Years of Education: N/A   Occupational History  . Not on file.   Social History Main Topics  . Smoking status: Former Smoker -- 1.00 packs/day for 40 years    Types: Cigarettes    Quit date: 01/07/1993  . Smokeless tobacco: Never Used  . Alcohol Use: No  . Drug Use: No  . Sexual Activity: No   Other Topics Concern  . Not on file   Social History Narrative    Review of Systems: See HPI, otherwise negative ROS  Physical Exam: BP 144/73 mmHg  Pulse 71  Temp(Src) 96.7 F (35.9 C) (Tympanic)  Resp 18  Ht 5' 6.5" (1.689 m)  Wt 82.555 kg (182 lb)  BMI 28.94 kg/m2  SpO2 100% General:   Alert,  pleasant  and cooperative in NAD Head:  Normocephalic and atraumatic. Neck:  Supple; no masses or thyromegaly. Lungs:  Clear throughout to auscultation.    Heart:  Regular rate and rhythm. Abdomen:  Soft, nontender and nondistended. Normal bowel sounds, without guarding, and without rebound.   Neurologic:  Alert and  oriented x4;  grossly normal neurologically.  Impression/Plan: Tommy Heath is here for an endoscopy and colonoscopy to be performed for iron def anemia, previous AVM of stomach and duodenum.  Risks, benefits, limitations, and alternatives regarding  endoscopy and colonoscopy, have been reviewed with the patient.  Questions have been answered.  All parties agreeable.   Gaylyn Cheers, MD  03/01/2015, 9:30 AM

## 2015-03-01 NOTE — Anesthesia Postprocedure Evaluation (Signed)
Anesthesia Post Note  Patient: Festus Lennox  Procedure(s) Performed: Procedure(s) (LRB): COLONOSCOPY WITH PROPOFOL (N/A) ESOPHAGOGASTRODUODENOSCOPY (EGD) WITH PROPOFOL (N/A)  Patient location during evaluation: Endoscopy Anesthesia Type: General Level of consciousness: awake and alert Pain management: pain level controlled Vital Signs Assessment: post-procedure vital signs reviewed and stable Respiratory status: spontaneous breathing, nonlabored ventilation, respiratory function stable and patient connected to nasal cannula oxygen Cardiovascular status: blood pressure returned to baseline and stable Postop Assessment: no signs of nausea or vomiting Anesthetic complications: no    Last Vitals:  Filed Vitals:   03/01/15 1053 03/01/15 1103  BP: 155/75 160/75  Pulse: 60 60  Temp:    Resp: 17 15    Last Pain: There were no vitals filed for this visit.               Martha Clan

## 2015-03-01 NOTE — Op Note (Signed)
[facility] Gastroenterology Patient Name: Tommy Heath Procedure Date: 03/01/2015 9:32 AM MRN: [id number] Account #: [id number] Date of Birth: Jan 04, 1934 Admit Type: Outpatient Age: 82 Room: [facility] ENDO ROOM 4 Gender: Male Note Status: Finalized Procedure:            Upper GI endoscopy Indications:          Iron deficiency anemia due to suspected upper                        gastrointestinal bleeding, Unexplained iron deficiency                        anemia Providers:            Manya Silvas, MD Referring MD:         Shirline Frees (Referring MD) Medicines:            Propofol per Anesthesia Complications:        No immediate complications. Procedure:            Pre-Anesthesia Assessment:                       - After reviewing the risks and benefits, the patient                        was deemed in satisfactory condition to undergo the                        procedure.                       After obtaining informed consent, the endoscope was                        passed under direct vision. Throughout the procedure,                        the patient's blood pressure, pulse, and oxygen                        saturations were monitored continuously. The Endoscope                        was introduced through the mouth, and advanced to the                        second part of duodenum. The upper GI endoscopy was                        accomplished without difficulty. The patient tolerated                        the procedure well. Findings:      The examined esophagus was normal.      Diffuse minimal inflammation characterized by erythema and granularity       was found in the gastric body.      The duodenal bulb was normal.      A few small angioectasias without bleeding were found in the second       portion of the duodenum. Coagulation for bleeding prevention using argon  plasma at 0.4 liters/minute and 20 watts was successful. To stop trace      bleeding, two hemostatic clips were successfully placed.      A small hiatal hernia was present. Impression:           - Normal esophagus.                       - Gastritis.                       - Normal duodenal bulb.                       - A few non-bleeding angioectasias in the duodenum.                        Treated with argon plasma coagulation (APC). Clips were                        placed.                       - No specimens collected. Recommendation:       - The findings and recommendations were discussed with                        the patient's family. Very soft diet. Take medicine Manya Silvas, MD 03/01/2015 10:31:18 AM This report has been signed electronically. Number of Addenda: 0 Note Initiated On: 03/01/2015 9:32 AM      [facility]

## 2015-03-01 NOTE — OR Nursing (Signed)
Patient sprayed with Cetacaine at 10:06.  At 11:06 when po fluids offered.  Patient refused.  Denies [place].

## 2015-03-01 NOTE — Op Note (Signed)
[facility] Gastroenterology Patient Name: Tommy Heath Procedure Date: 03/01/2015 9:32 AM MRN: [id number] Account #: [id number] Date of Birth: [birth year] Admit Type: Outpatient Age: 82 Room: [facility] ENDO ROOM 4 Gender: Male Note Status: Finalized Procedure:            Colonoscopy Indications:          Iron deficiency anemia, Unexplained iron deficiency                        anemia Providers:            Manya Silvas, MD Referring MD:         Shirline Frees (Referring MD) Medicines:            Propofol per Anesthesia Complications:        No immediate complications. Procedure:            Pre-Anesthesia Assessment:                       - After reviewing the risks and benefits, the patient                        was deemed in satisfactory condition to undergo the                        procedure.                       After obtaining informed consent, the colonoscope was                        passed under direct vision. Throughout the procedure,                        the patient's blood pressure, pulse, and oxygen                        saturations were monitored continuously. The                        Colonoscope was introduced through the anus and                        advanced to the the cecum, identified by appendiceal                        orifice and ileocecal valve. The colonoscopy was                        performed without difficulty. The patient tolerated the                        procedure well. The quality of the bowel preparation                        was excellent. Findings:      A diminutive polyp was found in the descending colon. The polyp was       sessile. The polyp was removed with a jumbo cold forceps. Resection and       retrieval were complete.      A single small angioectasia without bleeding was found  in the proximal       ascending colon. Coagulation for tissue destruction using argon plasma       at 0.4 liters/minute and 20  watts was successful.      Multiple small-mouthed diverticula were found in the sigmoid colon,       descending colon and transverse colon.      Internal hemorrhoids were found during endoscopy. The hemorrhoids were       small and Grade I (internal hemorrhoids that do not prolapse).      The exam was otherwise without abnormality. Impression:           - One diminutive polyp in the descending colon, removed                        with a jumbo cold forceps. Resected and retrieved.                       - A single non-bleeding colonic angioectasia. Treated                        with argon plasma coagulation (APC).                       - Diverticulosis in the sigmoid colon, in the                        descending colon and in the transverse colon.                       - Internal hemorrhoids.                       - The examination was otherwise normal. Recommendation:       - Await pathology results. Manya Silvas, MD 03/01/2015 9:59:50 AM This report has been signed electronically. Number of Addenda: 0 Note Initiated On: 03/01/2015 9:32 AM Scope Withdrawal Time: 0 hours 9 minutes 30 seconds  Total Procedure Duration: 0 hours 17 minutes 8 seconds       [facility]

## 2015-03-01 NOTE — Anesthesia Preprocedure Evaluation (Signed)
Anesthesia Evaluation  Patient identified by MRN, date of birth, ID band Patient awake    Reviewed: Allergy & Precautions, H&P , NPO status , Patient's Chart, lab work & pertinent test results, reviewed documented beta blocker date and time   Airway Mallampati: III  TM Distance: >3 FB Neck ROM: full    Dental no notable dental hx. (+) Partial Lower   Pulmonary neg pulmonary ROS, neg shortness of breath, sleep apnea (BiPAP) , neg COPD, neg recent URI, former smoker,    Pulmonary exam normal breath sounds clear to auscultation       Cardiovascular Exercise Tolerance: Good hypertension, On Medications (-) angina(-) CAD, (-) Past MI, (-) Cardiac Stents and (-) CABG Normal cardiovascular exam(-) dysrhythmias (-) Valvular Problems/Murmurs Rhythm:regular Rate:Normal     Neuro/Psych negative neurological ROS  negative psych ROS   GI/Hepatic negative GI ROS, Neg liver ROS, PUD,   Endo/Other  negative endocrine ROSdiabetes  Renal/GU negative Renal ROS  negative genitourinary   Musculoskeletal   Abdominal   Peds  Hematology negative hematology ROS (+)   Anesthesia Other Findings Past Medical History:   Diabetes mellitus type 2, uncomplicated (HCC)                Hypertension                                                 Hyperlipidemia                                               BPH (benign prostatic hyperplasia)                           Duodenal ulcer                                                 Comment:w/ gastric ulcer   H. pylori infection                                          AVM (arteriovenous malformation) of colon                    Anemia                                                       Allergic rhinitis                                            Adenomatous colon polyp  Sleep apnea                                                    Comment:BiPap   Glaucoma                                                      History of blood product transfusion                         Cataract cortical, senile                                    Colon polyps                                    02/2005, 2*     Comment:TUBULAR ADENOMA   History of blood transfusion                    02/2005         Comment:hgb was 6   History of duodenal ulcer                       02/2005         Comment:The lesion was 5 mm in largest dimension.   Reproductive/Obstetrics negative OB ROS                             Anesthesia Physical Anesthesia Plan  ASA: III  Anesthesia Plan: General   Post-op Pain Management:    Induction:   Airway Management Planned:   Additional Equipment:   Intra-op Plan:   Post-operative Plan:   Informed Consent: I have reviewed the patients History and Physical, chart, labs and discussed the procedure including the risks, benefits and alternatives for the proposed anesthesia with the patient or authorized representative who has indicated his/her understanding and acceptance.   Dental Advisory Given  Plan Discussed with: Anesthesiologist, CRNA and Surgeon  Anesthesia Plan Comments:         Anesthesia Quick Evaluation

## 2015-03-02 LAB — SURGICAL PATHOLOGY

## 2015-03-21 ENCOUNTER — Encounter: Payer: Self-pay | Admitting: Internal Medicine

## 2015-03-21 ENCOUNTER — Inpatient Hospital Stay (HOSPITAL_BASED_OUTPATIENT_CLINIC_OR_DEPARTMENT_OTHER): Payer: Medicare Other | Admitting: Internal Medicine

## 2015-03-21 ENCOUNTER — Inpatient Hospital Stay: Payer: Medicare Other | Attending: Internal Medicine

## 2015-03-21 VITALS — BP 153/71 | HR 70 | Temp 97.3°F | Resp 18 | Ht 66.5 in | Wt 182.5 lb

## 2015-03-21 DIAGNOSIS — I10 Essential (primary) hypertension: Secondary | ICD-10-CM | POA: Insufficient documentation

## 2015-03-21 DIAGNOSIS — E119 Type 2 diabetes mellitus without complications: Secondary | ICD-10-CM | POA: Diagnosis not present

## 2015-03-21 DIAGNOSIS — Z8719 Personal history of other diseases of the digestive system: Secondary | ICD-10-CM

## 2015-03-21 DIAGNOSIS — K922 Gastrointestinal hemorrhage, unspecified: Secondary | ICD-10-CM

## 2015-03-21 DIAGNOSIS — Z86010 Personal history of colonic polyps: Secondary | ICD-10-CM | POA: Insufficient documentation

## 2015-03-21 DIAGNOSIS — Z7984 Long term (current) use of oral hypoglycemic drugs: Secondary | ICD-10-CM

## 2015-03-21 DIAGNOSIS — Z79899 Other long term (current) drug therapy: Secondary | ICD-10-CM | POA: Insufficient documentation

## 2015-03-21 DIAGNOSIS — E785 Hyperlipidemia, unspecified: Secondary | ICD-10-CM

## 2015-03-21 DIAGNOSIS — N400 Enlarged prostate without lower urinary tract symptoms: Secondary | ICD-10-CM | POA: Insufficient documentation

## 2015-03-21 DIAGNOSIS — D500 Iron deficiency anemia secondary to blood loss (chronic): Secondary | ICD-10-CM

## 2015-03-21 DIAGNOSIS — D509 Iron deficiency anemia, unspecified: Secondary | ICD-10-CM

## 2015-03-21 DIAGNOSIS — Q2730 Arteriovenous malformation, site unspecified: Secondary | ICD-10-CM | POA: Insufficient documentation

## 2015-03-21 DIAGNOSIS — Z87891 Personal history of nicotine dependence: Secondary | ICD-10-CM | POA: Insufficient documentation

## 2015-03-21 DIAGNOSIS — R197 Diarrhea, unspecified: Secondary | ICD-10-CM | POA: Diagnosis not present

## 2015-03-21 LAB — CBC WITH DIFFERENTIAL/PLATELET
BASOS PCT: 1 %
Basophils Absolute: 0.1 10*3/uL (ref 0–0.1)
EOS ABS: 0.2 10*3/uL (ref 0–0.7)
Eosinophils Relative: 3 %
HCT: 33.9 % — ABNORMAL LOW (ref 40.0–52.0)
Hemoglobin: 10.9 g/dL — ABNORMAL LOW (ref 13.0–18.0)
Lymphocytes Relative: 20 %
Lymphs Abs: 1.6 10*3/uL (ref 1.0–3.6)
MCH: 26.2 pg (ref 26.0–34.0)
MCHC: 32.1 g/dL (ref 32.0–36.0)
MCV: 81.4 fL (ref 80.0–100.0)
MONO ABS: 0.7 10*3/uL (ref 0.2–1.0)
MONOS PCT: 9 %
NEUTROS PCT: 67 %
Neutro Abs: 5.3 10*3/uL (ref 1.4–6.5)
Platelets: 276 10*3/uL (ref 150–440)
RBC: 4.16 MIL/uL — ABNORMAL LOW (ref 4.40–5.90)
RDW: 24.7 % — AB (ref 11.5–14.5)
WBC: 7.8 10*3/uL (ref 3.8–10.6)

## 2015-03-21 LAB — FERRITIN: FERRITIN: 60 ng/mL (ref 24–336)

## 2015-03-21 NOTE — Progress Notes (Signed)
Pt fatigue level feeling better today. He is not having constipation.  He has diarrhea that he has been told is from metformin. He has appt later this month and he will mention it to his PCP.  He does not see any blood in stool. Eating and drinking good.

## 2015-03-21 NOTE — Progress Notes (Signed)
[place] OFFICE PROGRESS NOTE  Patient Care Team: Sherrin Daisy, MD as PCP - General (Family Medicine)   SUMMARY OF ONCOLOGIC HISTORY:  # 2009- IRON DEFICIENCY ANEMIA-likely secondary to upper GI bleed; EGD [gastric ulcer & duodenal AV malformations] & Colonoscopy 2007& 2012 [Dr.Elliot];   # JAN 2017- Hb-8.8/Ferritin-7; IV Venofer 300mg  x4 x ;FEB 2017 [Dr.Elliot non-bleeding angioectasias in the duodenum & Colon s/p argon plasma coagulation. PO Iron BID  INTERVAL HISTORY:  82 year old male patient with with above history of iron deficiency anemia likely from chronic GI blood loss/  Secondary to above GI blood loss is here for follow-up.   In the interim patient had upper and lower GI scopes-  That showed  nonbleeding AV malformations in the duodenum and the colon status post coagulation.  Otherwise denies any blood in stools.  He notes to have significant more energy status post IV  Iron. He continues to take about 2 pills of by mouth iron once a day without any abdominal discomfort or constipation.  Patient complains of diarrhea up to 3 stools a day. He states this has been going on for the last 6 months;  Question since going up on metformin to twice a day.   REVIEW OF SYSTEMS:  A complete 10 point review of system is done which is negative except mentioned above/history of present illness.   PAST MEDICAL HISTORY :  Past Medical History  Diagnosis Date  . Diabetes mellitus type 2, uncomplicated ([place])   . Hypertension   . Hyperlipidemia   . BPH (benign prostatic hyperplasia)   . Duodenal ulcer     w/ gastric ulcer  . H. pylori infection   . AVM (arteriovenous malformation) of colon   . Anemia   . Allergic rhinitis   . Sleep apnea     BiPap  . Glaucoma   . History of blood product transfusion   . Cataract cortical, senile   . Colon polyps 02/2005, 2012    TUBULAR ADENOMA  . History of blood transfusion 02/2005    hgb was 6  . History of duodenal ulcer  02/2005    The lesion was 5 mm in largest dimension.    PAST SURGICAL HISTORY :   Past Surgical History  Procedure Laterality Date  . Colonoscopy      07/2010, 09/09/2002, 10/15/2002, 02/16/2005, Adenomatous polyps, repeat 5 years, Dr Vira Agar  . Esophagogastroduodenoscopy      08/17/2010, 02/16/2005, no repeat per RTE  . Colonoscopy with propofol N/A 03/01/2015    Procedure: COLONOSCOPY WITH PROPOFOL;  Surgeon: Manya Silvas, MD;  Location: [facility] ENDOSCOPY;  Service: Endoscopy;  Laterality: N/A;  . Esophagogastroduodenoscopy (egd) with propofol N/A 03/01/2015    Procedure: ESOPHAGOGASTRODUODENOSCOPY (EGD) WITH PROPOFOL;  Surgeon: Manya Silvas, MD;  Location[facility] ENDOSCOPY;  Service: Endoscopy;  Laterality: N/A;    FAMILY HISTORY :   Family History  Problem Relation Age of Onset  . Diabetes Mother   . Breast cancer Sister     SOCIAL HISTORY:   Social History  Substance Use Topics  . Smoking status: Former Smoker -- 1.00 packs/day for 40 years    Types: Cigarettes    Quit date: 01/07/1993  . Smokeless tobacco: Never Used  . Alcohol Use: No    ALLERGIES:  has No Known Allergies.  MEDICATIONS:  Current Outpatient Prescriptions  Medication Sig Dispense Refill  . atorvastatin (LIPITOR) 80 MG tablet TAKE 1 TABLET NIGHTLY    . Cyanocobalamin (RA VITAMIN B-12  TR) 1000 MCG TBCR Take by mouth.    . ferrous sulfate 325 (65 FE) MG tablet Take by mouth 2 (two) times daily with a meal.     . glimepiride (AMARYL) 2 MG tablet TAKE 1 TABLET DAILY WITH BREAKFAST    . omeprazole (PRILOSEC) 20 MG capsule TAKE 1 CAPSULE DAILY (CALL FOR APPOINTMENT)    . pioglitazone (ACTOS) 15 MG tablet TAKE 1 TABLET DAILY    . ramipril (ALTACE) 10 MG capsule TAKE 1 CAPSULE DAILY    . sitaGLIPtin (JANUVIA) 100 MG tablet Take by mouth.    . tamsulosin (FLOMAX) 0.4 MG CAPS capsule TAKE 1 CAPSULE DAILY    . amLODipine (NORVASC) 10 MG tablet Reported on 03/21/2015    . metFORMIN (GLUCOPHAGE) 1000 MG tablet Take  1,000 mg by mouth 2 (two) times daily with a meal.      No current facility-administered medications for this visit.    PHYSICAL EXAMINATION:   BP 153/71 mmHg  Pulse 70  Temp(Src) 97.3 F (36.3 C) (Tympanic)  Resp 18  Ht 5' 6.5" (1.689 m)  Wt 182 lb 8.7 oz (82.8 kg)  BMI 29.02 kg/m2  Filed Weights   03/21/15 1007  Weight: 182 lb 8.7 oz (82.8 kg)     GENERAL: Well-nourished well-developed; Alert, no distress and comfortable. Alone.  EYES: No pallor no icterus. OROPHARYNX: no thrush or ulceration; good dentition  NECK: supple, no masses felt LYMPH: no palpable lymphadenopathy in the cervical, axillary or inguinal regions LUNGS: clear to auscultation and No wheeze or crackles HEART/CVS: regular rate & rhythm and no murmurs; No lower extremity edema ABDOMEN: abdomen soft, non-tender and normal bowel sounds Musculoskeletal:no cyanosis of digits and no clubbing  PSYCH: alert & oriented x 3 with fluent speech NEURO: no focal motor/sensory deficits SKIN: no rashes or significant lesions  LABORATORY DATA:  I have reviewed the data as listed    Component Value Date/Time   NA 138 01/17/2015 1439   K 3.7 01/17/2015 1439   CL 105 01/17/2015 1439   CO2 27 01/17/2015 1439   GLUCOSE 133* 01/17/2015 1439   BUN 17 01/17/2015 1439   CREATININE 1.19 01/17/2015 1439   CALCIUM 9.5 01/17/2015 1439   PROT 7.8 01/17/2015 1439   ALBUMIN 3.9 01/17/2015 1439   AST 21 01/17/2015 1439   ALT 12* 01/17/2015 1439   ALKPHOS 60 01/17/2015 1439   BILITOT 0.5 01/17/2015 1439   GFRNONAA 56* 01/17/2015 1439   GFRAA >60 01/17/2015 1439    No results found for: SPEP, UPEP  Lab Results  Component Value Date   WBC 7.2 02/14/2015   NEUTROABS 5.0 02/14/2015   HGB 9.9* 02/14/2015   HCT 31.4* 02/14/2015   MCV 78.1* 02/14/2015   PLT 321 02/14/2015      Chemistry      Component Value Date/Time   NA 138 01/17/2015 1439   K 3.7 01/17/2015 1439   CL 105 01/17/2015 1439   CO2 27  01/17/2015 1439   BUN 17 01/17/2015 1439   CREATININE 1.19 01/17/2015 1439      Component Value Date/Time   CALCIUM 9.5 01/17/2015 1439   ALKPHOS 60 01/17/2015 1439   AST 21 01/17/2015 1439   ALT 12* 01/17/2015 1439   BILITOT 0.5 01/17/2015 1439       ASSESSMENT & PLAN:   # Severe Iron deficiency Anemia- likely from chronic GI blood loss. 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  Status post IV Venofer 300 mg 11 January 2015.  Today hemoglobin is 10.9. Ferritin pending. For now I recommend continued by mouth iron twice a day. Hold off IV iron at this time.   #  Diarrhea-  Question related to  Metformin.  Defer to PCP.  # patient was asked to call us if he felt tired; would recommend checking a CBC sooner. Otherwise we'll have the patient come back in 4 months/CBC and iron studies and ferritin.  Plan IV iron if needed at that time.  # 15 minutes face-to-face with the patient discussing the above plan of care; more than 50% of time spent on prognosis/ natural history; counseling and coordination.     Cammie Sickle, MD 03/21/2015 10:20 AM

## 2015-07-04 DIAGNOSIS — K219 Gastro-esophageal reflux disease without esophagitis: Secondary | ICD-10-CM | POA: Insufficient documentation

## 2015-07-04 DIAGNOSIS — K5520 Angiodysplasia of colon without hemorrhage: Secondary | ICD-10-CM | POA: Insufficient documentation

## 2015-07-21 ENCOUNTER — Other Ambulatory Visit: Payer: Medicare Other

## 2015-07-25 ENCOUNTER — Inpatient Hospital Stay: Payer: Medicare Other

## 2015-07-25 ENCOUNTER — Inpatient Hospital Stay: Payer: Medicare Other | Attending: Internal Medicine | Admitting: Internal Medicine

## 2015-07-25 VITALS — BP 158/74 | HR 71 | Temp 96.7°F | Resp 18 | Wt 185.2 lb

## 2015-07-25 DIAGNOSIS — E785 Hyperlipidemia, unspecified: Secondary | ICD-10-CM | POA: Insufficient documentation

## 2015-07-25 DIAGNOSIS — D500 Iron deficiency anemia secondary to blood loss (chronic): Secondary | ICD-10-CM | POA: Diagnosis present

## 2015-07-25 DIAGNOSIS — E119 Type 2 diabetes mellitus without complications: Secondary | ICD-10-CM | POA: Diagnosis not present

## 2015-07-25 DIAGNOSIS — Z79899 Other long term (current) drug therapy: Secondary | ICD-10-CM | POA: Diagnosis not present

## 2015-07-25 DIAGNOSIS — Z8711 Personal history of peptic ulcer disease: Secondary | ICD-10-CM | POA: Insufficient documentation

## 2015-07-25 DIAGNOSIS — G4730 Sleep apnea, unspecified: Secondary | ICD-10-CM | POA: Diagnosis not present

## 2015-07-25 DIAGNOSIS — R350 Frequency of micturition: Secondary | ICD-10-CM | POA: Diagnosis not present

## 2015-07-25 DIAGNOSIS — Z7984 Long term (current) use of oral hypoglycemic drugs: Secondary | ICD-10-CM | POA: Insufficient documentation

## 2015-07-25 DIAGNOSIS — Z87891 Personal history of nicotine dependence: Secondary | ICD-10-CM | POA: Diagnosis not present

## 2015-07-25 DIAGNOSIS — I10 Essential (primary) hypertension: Secondary | ICD-10-CM | POA: Diagnosis not present

## 2015-07-25 DIAGNOSIS — Z86010 Personal history of colonic polyps: Secondary | ICD-10-CM | POA: Insufficient documentation

## 2015-07-25 DIAGNOSIS — Z8719 Personal history of other diseases of the digestive system: Secondary | ICD-10-CM | POA: Insufficient documentation

## 2015-07-25 DIAGNOSIS — N400 Enlarged prostate without lower urinary tract symptoms: Secondary | ICD-10-CM | POA: Diagnosis not present

## 2015-07-25 LAB — CBC WITH DIFFERENTIAL/PLATELET
BASOS PCT: 2 %
Basophils Absolute: 0.1 10*3/uL (ref 0–0.1)
Eosinophils Absolute: 0.2 10*3/uL (ref 0–0.7)
Eosinophils Relative: 3 %
HEMATOCRIT: 33.7 % — AB (ref 40.0–52.0)
HEMOGLOBIN: 10.9 g/dL — AB (ref 13.0–18.0)
LYMPHS ABS: 1 10*3/uL (ref 1.0–3.6)
LYMPHS PCT: 17 %
MCH: 27.2 pg (ref 26.0–34.0)
MCHC: 32.5 g/dL (ref 32.0–36.0)
MCV: 83.6 fL (ref 80.0–100.0)
MONO ABS: 0.5 10*3/uL (ref 0.2–1.0)
MONOS PCT: 8 %
NEUTROS ABS: 4.2 10*3/uL (ref 1.4–6.5)
NEUTROS PCT: 70 %
Platelets: 245 10*3/uL (ref 150–440)
RBC: 4.03 MIL/uL — ABNORMAL LOW (ref 4.40–5.90)
RDW: 16.7 % — AB (ref 11.5–14.5)
WBC: 6 10*3/uL (ref 3.8–10.6)

## 2015-07-25 LAB — IRON AND TIBC
Iron: 49 ug/dL (ref 45–182)
Saturation Ratios: 14 % — ABNORMAL LOW (ref 17.9–39.5)
TIBC: 340 ug/dL (ref 250–450)
UIBC: 291 ug/dL

## 2015-07-25 LAB — FERRITIN: Ferritin: 14 ng/mL — ABNORMAL LOW (ref 24–336)

## 2015-07-25 NOTE — Progress Notes (Signed)
[place] OFFICE PROGRESS NOTE  Patient Care Team: Sherrin Daisy, MD as PCP - General (Family Medicine)   SUMMARY OF ONCOLOGIC HISTORY:  # 2009- IRON DEFICIENCY ANEMIA-likely secondary to upper GI bleed; EGD [gastric ulcer & duodenal AV malformations] & Colonoscopy 2007& 2012 [Dr.Elliot];   # JAN 2017- Hb-8.8/Ferritin-7; IV Venofer 300mg  x4 x ;FEB 2017 [Dr.Elliot non-bleeding angioectasias in the duodenum & Colon s/p argon plasma coagulation. PO Iron BID  INTERVAL HISTORY:  82 year old male patient with with above history of iron deficiency anemia likely from chronic GI blood loss/  Secondary to above GI blood loss is here for follow-up.  Otherwise denies any blood in stools.  He notes to have significant more energy status post IV  Iron. He continues to take about 2 pills of by mouth iron once a day without any abdominal discomfort or constipation. He complains of black colored stools attributed to by mouth iron  He does complain of increased frequency of urination especially at nighttime. He is on Flomax. Denies any blood in urine.   REVIEW OF SYSTEMS:  A complete 10 point review of system is done which is negative except mentioned above/history of present illness.   PAST MEDICAL HISTORY :  Past Medical History  Diagnosis Date  . Diabetes mellitus type 2, uncomplicated ([place])   . Hypertension   . Hyperlipidemia   . BPH (benign prostatic hyperplasia)   . Duodenal ulcer     w/ gastric ulcer  . H. pylori infection   . AVM (arteriovenous malformation) of colon   . Anemia   . Allergic rhinitis   . Sleep apnea     BiPap  . Glaucoma   . History of blood product transfusion   . Cataract cortical, senile   . Colon polyps 02/2005, 2012    TUBULAR ADENOMA  . History of blood transfusion 02/2005    hgb was 6  . History of duodenal ulcer 02/2005    The lesion was 5 mm in largest dimension.    PAST SURGICAL HISTORY :   Past Surgical History  Procedure Laterality  Date  . Colonoscopy      07/2010, 09/09/2002, 10/15/2002, 02/16/2005, Adenomatous polyps, repeat 5 years, Dr Vira Agar  . Esophagogastroduodenoscopy      08/17/2010, 02/16/2005, no repeat per RTE  . Colonoscopy with propofol N/A 03/01/2015    Procedure: COLONOSCOPY WITH PROPOFOL;  Surgeon: Manya Silvas, MD;  Location: [facility] ENDOSCOPY;  Service: Endoscopy;  Laterality: N/A;  . Esophagogastroduodenoscopy (egd) with propofol N/A 03/01/2015    Procedure: ESOPHAGOGASTRODUODENOSCOPY (EGD) WITH PROPOFOL;  Surgeon: Manya Silvas, MD;  Location: [facility] ENDOSCOPY;  Service: Endoscopy;  Laterality: N/A;    FAMILY HISTORY :   Family History  Problem Relation Age of Onset  . Diabetes Mother   . Breast cancer Sister     SOCIAL HISTORY:   Social History  Substance Use Topics  . Smoking status: Former Smoker -- 1.00 packs/day for 40 years    Types: Cigarettes    Quit date: 01/07/1993  . Smokeless tobacco: Never Used  . Alcohol Use: No    ALLERGIES:  has No Known Allergies.  MEDICATIONS:  Current Outpatient Prescriptions  Medication Sig Dispense Refill  . amLODipine (NORVASC) 10 MG tablet Reported on 03/21/2015    . atorvastatin (LIPITOR) 80 MG tablet TAKE 1 TABLET NIGHTLY    . Cyanocobalamin (RA VITAMIN B-12 TR) 1000 MCG TBCR Take by mouth.    . ferrous sulfate 325 (65 FE) MG tablet  Take by mouth 2 (two) times daily with a meal.     . glimepiride (AMARYL) 2 MG tablet TAKE 1 TABLET DAILY WITH BREAKFAST    . hydrOXYzine (ATARAX/VISTARIL) 25 MG tablet Take by mouth.    . metFORMIN (GLUCOPHAGE) 1000 MG tablet Take 1,000 mg by mouth 2 (two) times daily with a meal.     . omeprazole (PRILOSEC) 20 MG capsule TAKE 1 CAPSULE DAILY (CALL FOR APPOINTMENT)    . pioglitazone (ACTOS) 15 MG tablet TAKE 1 TABLET DAILY    . ramipril (ALTACE) 10 MG capsule TAKE 1 CAPSULE DAILY    . sitaGLIPtin (JANUVIA) 100 MG tablet Take by mouth.    . tamsulosin (FLOMAX) 0.4 MG CAPS capsule TAKE 1 CAPSULE DAILY     No current  facility-administered medications for this visit.    PHYSICAL EXAMINATION:   BP 158/74 mmHg  Pulse 71  Temp(Src) 96.7 F (35.9 C) (Tympanic)  Resp 18  Wt 185 lb 3 oz (84 kg)  Filed Weights   07/25/15 0958  Weight: 185 lb 3 oz (84 kg)     GENERAL: Well-nourished well-developed; Alert, no distress and comfortable. Alone.  EYES: No pallor no icterus. OROPHARYNX: no thrush or ulceration; good dentition  NECK: supple, no masses felt LYMPH: no palpable lymphadenopathy in the cervical, axillary or inguinal regions LUNGS: clear to auscultation and No wheeze or crackles HEART/CVS: regular rate & rhythm and no murmurs; No lower extremity edema ABDOMEN: abdomen soft, non-tender and normal bowel sounds Musculoskeletal:no cyanosis of digits and no clubbing  PSYCH: alert & oriented x 3 with fluent speech NEURO: no focal motor/sensory deficits SKIN: no rashes or significant lesions  LABORATORY DATA:  I have reviewed the data as listed    Component Value Date/Time   NA 138 01/17/2015 1439   K 3.7 01/17/2015 1439   CL 105 01/17/2015 1439   CO2 27 01/17/2015 1439   GLUCOSE 133* 01/17/2015 1439   BUN 17 01/17/2015 1439   CREATININE 1.19 01/17/2015 1439   CALCIUM 9.5 01/17/2015 1439   PROT 7.8 01/17/2015 1439   ALBUMIN 3.9 01/17/2015 1439   AST 21 01/17/2015 1439   ALT 12* 01/17/2015 1439   ALKPHOS 60 01/17/2015 1439   BILITOT 0.5 01/17/2015 1439   GFRNONAA 56* 01/17/2015 1439   GFRAA >60 01/17/2015 1439    No results found for: SPEP, UPEP  Lab Results  Component Value Date   WBC 7.8 03/21/2015   NEUTROABS 5.3 03/21/2015   HGB 10.9* 03/21/2015   HCT 33.9* 03/21/2015   MCV 81.4 03/21/2015   PLT 276 03/21/2015      Chemistry      Component Value Date/Time   NA 138 01/17/2015 1439   K 3.7 01/17/2015 1439   CL 105 01/17/2015 1439   CO2 27 01/17/2015 1439   BUN 17 01/17/2015 1439   CREATININE 1.19 01/17/2015 1439      Component Value Date/Time   CALCIUM 9.5  01/17/2015 1439   ALKPHOS 60 01/17/2015 1439   AST 21 01/17/2015 1439   ALT 12* 01/17/2015 1439   BILITOT 0.5 01/17/2015 1439       ASSESSMENT & PLAN:  Iron deficiency anemia due to chronic blood loss # Severe Iron deficiency Anemia- likely from chronic GI blood loss. 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  Status post IV Venofer 300 mg 11 January 2015. Symptomatically feeling better. Await CBC and iron studies from today. If low recommend IV iron; it steady recommend  continued by mouth iron intake.    # polyuria- Question etiology deferred to PCP  # Otherwise patient will follow-up with [place] CBC and iron studies/ done a few days prior to the visit- in 6 months.      Cammie Sickle, MD 07/25/2015 10:30 AM

## 2015-07-25 NOTE — Assessment & Plan Note (Signed)
#   Severe Iron deficiency Anemia- likely from chronic GI blood loss. 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  Status post IV Venofer 300 mg 11 January 2015. Symptomatically feeling better. Await CBC and iron studies from today. If low recommend IV iron; it steady recommend continued by mouth iron intake.    # polyuria- Question etiology deferred to PCP  # Otherwise patient will follow-up with [place] CBC and iron studies/ done a few days prior to the visit- in 6 months.

## 2015-07-26 ENCOUNTER — Other Ambulatory Visit: Payer: Self-pay | Admitting: Internal Medicine

## 2015-07-27 ENCOUNTER — Telehealth: Payer: Self-pay | Admitting: *Deleted

## 2015-07-27 NOTE — Telephone Encounter (Signed)
msg sent to cancer center scheduling to arrange for IV venofer tx. Rodena Piety, please let pt know that scheduling will be calling him with these apts.

## 2015-07-27 NOTE — Telephone Encounter (Signed)
-----   Message from Cammie Sickle, MD sent at 07/26/2015  4:09 PM EDT ----- Needs IV venofer w x4- inofrm pt-Thx

## 2015-07-28 NOTE — Telephone Encounter (Signed)
Called patient and informed him that MD recommends IV venofer and a scheduler will be contacting him to schedule it. Verbalized understanding.

## 2015-08-01 ENCOUNTER — Inpatient Hospital Stay: Payer: Medicare Other

## 2015-08-01 VITALS — BP 122/66 | HR 66 | Temp 97.7°F | Resp 18

## 2015-08-01 DIAGNOSIS — D500 Iron deficiency anemia secondary to blood loss (chronic): Secondary | ICD-10-CM

## 2015-08-01 MED ORDER — SODIUM CHLORIDE 0.9 % IV SOLN
Freq: Once | INTRAVENOUS | Status: AC
  Administered 2015-08-01: 14:00:00 via INTRAVENOUS
  Filled 2015-08-01: qty 1000

## 2015-08-01 MED ORDER — SODIUM CHLORIDE 0.9 % IV SOLN
200.0000 mg | Freq: Once | INTRAVENOUS | Status: AC
  Administered 2015-08-01: 200 mg via INTRAVENOUS
  Filled 2015-08-01: qty 10

## 2015-08-08 ENCOUNTER — Inpatient Hospital Stay: Payer: Medicare Other

## 2015-08-08 ENCOUNTER — Inpatient Hospital Stay: Payer: Medicare Other | Attending: Internal Medicine

## 2015-08-08 VITALS — BP 148/69 | HR 65 | Temp 96.8°F | Resp 18

## 2015-08-08 DIAGNOSIS — Z8719 Personal history of other diseases of the digestive system: Secondary | ICD-10-CM | POA: Diagnosis not present

## 2015-08-08 DIAGNOSIS — Z8711 Personal history of peptic ulcer disease: Secondary | ICD-10-CM | POA: Diagnosis not present

## 2015-08-08 DIAGNOSIS — D500 Iron deficiency anemia secondary to blood loss (chronic): Secondary | ICD-10-CM | POA: Diagnosis present

## 2015-08-08 MED ORDER — SODIUM CHLORIDE 0.9 % IV SOLN
200.0000 mg | Freq: Once | INTRAVENOUS | Status: AC
  Administered 2015-08-08: 200 mg via INTRAVENOUS
  Filled 2015-08-08: qty 10

## 2015-08-08 MED ORDER — SODIUM CHLORIDE 0.9 % IV SOLN
Freq: Once | INTRAVENOUS | Status: AC
  Administered 2015-08-08: 1 mL via INTRAVENOUS
  Filled 2015-08-08: qty 1000

## 2015-08-09 ENCOUNTER — Ambulatory Visit: Payer: Medicare Other

## 2015-08-15 ENCOUNTER — Inpatient Hospital Stay: Payer: Medicare Other

## 2015-08-16 ENCOUNTER — Inpatient Hospital Stay: Payer: Medicare Other

## 2015-08-16 VITALS — BP 147/66 | HR 75 | Temp 97.5°F | Resp 18

## 2015-08-16 DIAGNOSIS — D500 Iron deficiency anemia secondary to blood loss (chronic): Secondary | ICD-10-CM | POA: Diagnosis not present

## 2015-08-16 MED ORDER — SODIUM CHLORIDE 0.9 % IV SOLN
200.0000 mg | Freq: Once | INTRAVENOUS | Status: AC
  Administered 2015-08-16: 200 mg via INTRAVENOUS
  Filled 2015-08-16: qty 10

## 2015-08-16 MED ORDER — SODIUM CHLORIDE 0.9 % IV SOLN
Freq: Once | INTRAVENOUS | Status: AC
  Administered 2015-08-16: 15:00:00 via INTRAVENOUS
  Filled 2015-08-16: qty 1000

## 2015-08-22 ENCOUNTER — Inpatient Hospital Stay: Payer: Medicare Other

## 2015-08-23 ENCOUNTER — Inpatient Hospital Stay: Payer: Medicare Other

## 2015-08-23 VITALS — BP 158/70 | HR 76 | Temp 96.8°F | Resp 16

## 2015-08-23 DIAGNOSIS — D500 Iron deficiency anemia secondary to blood loss (chronic): Secondary | ICD-10-CM

## 2015-08-23 MED ORDER — SODIUM CHLORIDE 0.9 % IV SOLN
Freq: Once | INTRAVENOUS | Status: AC
  Administered 2015-08-23: 09:00:00 via INTRAVENOUS
  Filled 2015-08-23: qty 1000

## 2015-08-23 MED ORDER — SODIUM CHLORIDE 0.9 % IV SOLN
200.0000 mg | Freq: Once | INTRAVENOUS | Status: AC
  Administered 2015-08-23: 200 mg via INTRAVENOUS
  Filled 2015-08-23: qty 10

## 2016-01-16 ENCOUNTER — Inpatient Hospital Stay: Payer: Medicare Other | Attending: Internal Medicine

## 2016-01-16 ENCOUNTER — Other Ambulatory Visit: Payer: Self-pay | Admitting: Internal Medicine

## 2016-01-16 DIAGNOSIS — Z86010 Personal history of colonic polyps: Secondary | ICD-10-CM | POA: Insufficient documentation

## 2016-01-16 DIAGNOSIS — N400 Enlarged prostate without lower urinary tract symptoms: Secondary | ICD-10-CM | POA: Diagnosis not present

## 2016-01-16 DIAGNOSIS — Z79899 Other long term (current) drug therapy: Secondary | ICD-10-CM | POA: Insufficient documentation

## 2016-01-16 DIAGNOSIS — Z8719 Personal history of other diseases of the digestive system: Secondary | ICD-10-CM | POA: Insufficient documentation

## 2016-01-16 DIAGNOSIS — Z7984 Long term (current) use of oral hypoglycemic drugs: Secondary | ICD-10-CM | POA: Insufficient documentation

## 2016-01-16 DIAGNOSIS — Z87891 Personal history of nicotine dependence: Secondary | ICD-10-CM | POA: Insufficient documentation

## 2016-01-16 DIAGNOSIS — D500 Iron deficiency anemia secondary to blood loss (chronic): Secondary | ICD-10-CM | POA: Diagnosis not present

## 2016-01-16 DIAGNOSIS — E119 Type 2 diabetes mellitus without complications: Secondary | ICD-10-CM | POA: Insufficient documentation

## 2016-01-16 DIAGNOSIS — H409 Unspecified glaucoma: Secondary | ICD-10-CM | POA: Insufficient documentation

## 2016-01-16 DIAGNOSIS — Z8711 Personal history of peptic ulcer disease: Secondary | ICD-10-CM | POA: Insufficient documentation

## 2016-01-16 LAB — CBC WITH DIFFERENTIAL/PLATELET
BASOS PCT: 1 %
Basophils Absolute: 0.1 10*3/uL (ref 0–0.1)
EOS PCT: 2 %
Eosinophils Absolute: 0.1 10*3/uL (ref 0–0.7)
HCT: 34.9 % — ABNORMAL LOW (ref 40.0–52.0)
HEMOGLOBIN: 11.2 g/dL — AB (ref 13.0–18.0)
LYMPHS ABS: 1.3 10*3/uL (ref 1.0–3.6)
Lymphocytes Relative: 19 %
MCH: 27.2 pg (ref 26.0–34.0)
MCHC: 32.2 g/dL (ref 32.0–36.0)
MCV: 84.5 fL (ref 80.0–100.0)
MONOS PCT: 9 %
Monocytes Absolute: 0.6 10*3/uL (ref 0.2–1.0)
NEUTROS PCT: 69 %
Neutro Abs: 4.8 10*3/uL (ref 1.4–6.5)
Platelets: 284 10*3/uL (ref 150–440)
RBC: 4.13 MIL/uL — ABNORMAL LOW (ref 4.40–5.90)
RDW: 15.2 % — ABNORMAL HIGH (ref 11.5–14.5)
WBC: 7 10*3/uL (ref 3.8–10.6)

## 2016-01-16 LAB — FERRITIN: FERRITIN: 16 ng/mL — AB (ref 24–336)

## 2016-01-16 LAB — IRON AND TIBC
IRON: 41 ug/dL — AB (ref 45–182)
Saturation Ratios: 12 % — ABNORMAL LOW (ref 17.9–39.5)
TIBC: 353 ug/dL (ref 250–450)
UIBC: 312 ug/dL

## 2016-01-23 ENCOUNTER — Inpatient Hospital Stay: Payer: Medicare Other

## 2016-01-23 ENCOUNTER — Inpatient Hospital Stay (HOSPITAL_BASED_OUTPATIENT_CLINIC_OR_DEPARTMENT_OTHER): Payer: Medicare Other | Admitting: Internal Medicine

## 2016-01-23 VITALS — BP 161/68 | HR 73 | Temp 96.8°F | Resp 18

## 2016-01-23 DIAGNOSIS — Z79899 Other long term (current) drug therapy: Secondary | ICD-10-CM

## 2016-01-23 DIAGNOSIS — Z86010 Personal history of colonic polyps: Secondary | ICD-10-CM

## 2016-01-23 DIAGNOSIS — H409 Unspecified glaucoma: Secondary | ICD-10-CM

## 2016-01-23 DIAGNOSIS — N400 Enlarged prostate without lower urinary tract symptoms: Secondary | ICD-10-CM

## 2016-01-23 DIAGNOSIS — Z87891 Personal history of nicotine dependence: Secondary | ICD-10-CM

## 2016-01-23 DIAGNOSIS — D500 Iron deficiency anemia secondary to blood loss (chronic): Secondary | ICD-10-CM

## 2016-01-23 DIAGNOSIS — Z8711 Personal history of peptic ulcer disease: Secondary | ICD-10-CM | POA: Diagnosis not present

## 2016-01-23 DIAGNOSIS — E119 Type 2 diabetes mellitus without complications: Secondary | ICD-10-CM | POA: Diagnosis not present

## 2016-01-23 DIAGNOSIS — Z8719 Personal history of other diseases of the digestive system: Secondary | ICD-10-CM | POA: Diagnosis not present

## 2016-01-23 DIAGNOSIS — Z7984 Long term (current) use of oral hypoglycemic drugs: Secondary | ICD-10-CM

## 2016-01-23 MED ORDER — IRON SUCROSE 20 MG/ML IV SOLN
200.0000 mg | Freq: Once | INTRAVENOUS | Status: AC
  Administered 2016-01-23: 200 mg via INTRAVENOUS
  Filled 2016-01-23: qty 10

## 2016-01-23 MED ORDER — SODIUM CHLORIDE 0.9 % IV SOLN
Freq: Once | INTRAVENOUS | Status: AC
  Administered 2016-01-23: 11:00:00 via INTRAVENOUS
  Filled 2016-01-23: qty 1000

## 2016-01-23 MED ORDER — SODIUM CHLORIDE 0.9 % IV SOLN: 200.0000 mg | Freq: Once | INTRAVENOUS | Status: DC

## 2016-01-23 NOTE — Assessment & Plan Note (Signed)
#   Severe Iron deficiency Anemia- likely from chronic GI blood loss. 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  Status post IV Venofer 300 mg 11 July 2015.   # Proceed with IV Venofer today and weekly for 4 doses.   # Follow-up with [place] CBC and iron studies/ done a few days prior to the visit- in 6 months.

## 2016-01-23 NOTE — Progress Notes (Signed)
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 PO Iron BID  INTERVAL HISTORY:  83 year old male patient with with above history of iron deficiency anemia likely from chronic GI blood loss/  Secondary to above GI blood loss is here for follow-up.  Otherwise denies any blood in stools.  He notes to have significant more energy status post IV  Iron in July. He states he feels well today and is not fatigued like he was back in July. He states he has been taking his oral iron inconsistently due to problems with constiptaion. Denies any blood in urine. Has a follow-up appointment with GI for possible EGD next month.   REVIEW OF SYSTEMS:  A complete 10 point review of system is done which is negative except mentioned above/history of present illness.   PAST MEDICAL HISTORY :  Past Medical History:  Diagnosis Date  . Allergic rhinitis   . Anemia   . AVM (arteriovenous malformation) of colon   . BPH (benign prostatic hyperplasia)   . Cataract cortical, senile   . Colon polyps 02/2005, 2012   TUBULAR ADENOMA  . Diabetes mellitus type 2, uncomplicated ([place])   . Duodenal ulcer    w/ gastric ulcer  . Glaucoma   . H. pylori infection   . History of blood product transfusion   . History of blood transfusion 02/2005   hgb was 6  . History of duodenal ulcer 02/2005   The lesion was 5 mm in largest dimension.  . Hyperlipidemia   . Hypertension   . Sleep apnea    BiPap    PAST SURGICAL HISTORY :   Past Surgical History:  Procedure Laterality Date  . COLONOSCOPY     07/2010,  09/09/2002, 10/15/2002, 02/16/2005, Adenomatous polyps, repeat 5 years, Dr Vira Agar  . COLONOSCOPY WITH PROPOFOL N/A 03/01/2015   Procedure: COLONOSCOPY WITH PROPOFOL;  Surgeon: Manya Silvas, MD;  Location[facility] ENDOSCOPY;  Service: Endoscopy;  Laterality: N/A;  . ESOPHAGOGASTRODUODENOSCOPY     08/17/2010, 02/16/2005, no repeat per RTE  . ESOPHAGOGASTRODUODENOSCOPY (EGD) WITH PROPOFOL N/A 03/01/2015   Procedure: ESOPHAGOGASTRODUODENOSCOPY (EGD) WITH PROPOFOL;  Surgeon: Manya Silvas, MD;  Location: [facility] ENDOSCOPY;  Service: Endoscopy;  Laterality: N/A;    FAMILY HISTORY :   Family History  Problem Relation Age of Onset  . Diabetes Mother   . Breast cancer Sister     SOCIAL HISTORY:   Social History  Substance Use Topics  . Smoking status: Former Smoker    Packs/day: 1.00    Years: 40.00    Types: Cigarettes    Quit date: 01/07/1993  . Smokeless tobacco: Never Used  . Alcohol use No    ALLERGIES:  has No Known Allergies.  MEDICATIONS:  Current Outpatient Prescriptions  Medication Sig Dispense Refill  . amLODipine (NORVASC) 10 MG tablet Reported on 03/21/2015    . atorvastatin (LIPITOR) 80 MG tablet TAKE 1 TABLET NIGHTLY    . Cyanocobalamin (RA VITAMIN B-12 TR) 1000 MCG TBCR Take by mouth.    . ferrous sulfate 325 (65 FE) MG tablet Take by mouth 2 (two) times daily with  a meal.     . glimepiride (AMARYL) 2 MG tablet TAKE 1 TABLET DAILY WITH BREAKFAST    . metFORMIN (GLUCOPHAGE) 1000 MG tablet Take 1,000 mg by mouth 2 (two) times daily with a meal.     . omeprazole (PRILOSEC) 20 MG capsule TAKE 1 CAPSULE DAILY (CALL FOR APPOINTMENT)    . pioglitazone (ACTOS) 15 MG tablet TAKE 1 TABLET DAILY    . ramipril (ALTACE) 10 MG capsule TAKE 1 CAPSULE DAILY    . sitaGLIPtin (JANUVIA) 100 MG tablet Take 100 mg by mouth daily.     . tamsulosin (FLOMAX) 0.4 MG CAPS capsule TAKE 1 CAPSULE DAILY     No current facility-administered medications for this visit.    Facility-Administered  Medications Ordered in Other Visits  Medication Dose Route Frequency Provider Last Rate Last Dose  . 0.9 %  sodium chloride infusion   Intravenous Once Cammie Sickle, MD      . iron sucrose (VENOFER) injection 200 mg  200 mg Intravenous Once Cammie Sickle, MD        PHYSICAL EXAMINATION:   BP (!) 146/70 (BP Location: Left Arm, Patient Position: Sitting)   Pulse 73   Temp 97.6 F (36.4 C) (Tympanic)   Wt 183 lb 15.6 oz (83.4 kg)   BMI 29.25 kg/m   Filed Weights   01/23/16 1013  Weight: 183 lb 15.6 oz (83.4 kg)     GENERAL: Well-nourished well-developed; Alert, no distress and comfortable. Alone.  EYES: No pallor no icterus. OROPHARYNX: no thrush or ulceration; good dentition  NECK: supple, no masses felt LYMPH: no palpable lymphadenopathy in the cervical, axillary or inguinal regions LUNGS: clear to auscultation and No wheeze or crackles HEART/CVS: regular rate & rhythm and no murmurs; No lower extremity edema ABDOMEN: abdomen soft, non-tender and normal bowel sounds Musculoskeletal:no cyanosis of digits and no clubbing  PSYCH: alert & oriented x 3 with fluent speech NEURO: no focal motor/sensory deficits SKIN: no rashes or significant lesions  LABORATORY DATA:  I have reviewed the data as listed    Component Value Date/Time   NA 138 01/17/2015 1439   K 3.7 01/17/2015 1439   CL 105 01/17/2015 1439   CO2 27 01/17/2015 1439   GLUCOSE 133 (H) 01/17/2015 1439   BUN 17 01/17/2015 1439   CREATININE 1.19 01/17/2015 1439   CALCIUM 9.5 01/17/2015 1439   PROT 7.8 01/17/2015 1439   ALBUMIN 3.9 01/17/2015 1439   AST 21 01/17/2015 1439   ALT 12 (L) 01/17/2015 1439   ALKPHOS 60 01/17/2015 1439   BILITOT 0.5 01/17/2015 1439   GFRNONAA 56 (L) 01/17/2015 1439   GFRAA >60 01/17/2015 1439    No results found for: SPEP, UPEP  Lab Results  Component Value Date   WBC 7.0 01/16/2016   NEUTROABS 4.8 01/16/2016   HGB 11.2 (L) 01/16/2016   HCT 34.9 (L)  01/16/2016   MCV 84.5 01/16/2016   PLT 284 01/16/2016      Chemistry      Component Value Date/Time   NA 138 01/17/2015 1439   K 3.7 01/17/2015 1439   CL 105 01/17/2015 1439   CO2 27 01/17/2015 1439   BUN 17 01/17/2015 1439   CREATININE 1.19 01/17/2015 1439      Component Value Date/Time   CALCIUM 9.5 01/17/2015 1439   ALKPHOS 60 01/17/2015 1439   AST 21 01/17/2015 1439   ALT 12 (L) 01/17/2015 1439   BILITOT 0.5 01/17/2015 1439  ASSESSMENT & PLAN:  Iron deficiency anemia due to chronic blood loss # Severe Iron deficiency Anemia- likely from chronic GI blood loss. 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  Status post IV Venofer 300 mg 11 July 2015.   # Proceed with IV Venofer today and weekly for 4 doses.   # Follow-up with [place] CBC and iron studies/ done a few days prior to the visit- in 6 months.    Faythe Casa, NP   Jacquelin Hawking, NP 01/23/2016 10:45 AM

## 2016-01-23 NOTE — Progress Notes (Signed)
Patient here today for follow up.  Patient states no new concerns today  

## 2016-01-30 ENCOUNTER — Inpatient Hospital Stay: Payer: Medicare Other

## 2016-01-30 VITALS — BP 141/66 | HR 80 | Temp 96.1°F | Resp 18

## 2016-01-30 DIAGNOSIS — D500 Iron deficiency anemia secondary to blood loss (chronic): Secondary | ICD-10-CM | POA: Diagnosis not present

## 2016-01-30 MED ORDER — IRON SUCROSE 20 MG/ML IV SOLN
200.0000 mg | Freq: Once | INTRAVENOUS | Status: AC
  Administered 2016-01-30: 200 mg via INTRAVENOUS
  Filled 2016-01-30: qty 10

## 2016-01-30 MED ORDER — SODIUM CHLORIDE 0.9 % IV SOLN: 200.0000 mg | Freq: Once | INTRAVENOUS | Status: DC

## 2016-01-30 MED ORDER — SODIUM CHLORIDE 0.9 % IV SOLN
Freq: Once | INTRAVENOUS | Status: AC
  Administered 2016-01-30: 14:00:00 via INTRAVENOUS
  Filled 2016-01-30: qty 1000

## 2016-01-30 NOTE — Patient Instructions (Signed)
Iron Sucrose injection What is this medicine? IRON SUCROSE (AHY ern SOO krohs) is an iron complex. Iron is used to make healthy red blood cells, which carry oxygen and nutrients throughout the body. This medicine is used to treat iron deficiency anemia in people with chronic kidney disease. This medicine may be used for other purposes; ask your health care provider or pharmacist if you have questions. COMMON BRAND NAME(S): Venofer What should I tell my health care provider before I take this medicine? They need to know if you have any of these conditions: -anemia not caused by low iron levels -heart disease -high levels of iron in the blood -kidney disease -liver disease -an unusual or allergic reaction to iron, other medicines, foods, dyes, or preservatives -pregnant or trying to get pregnant -breast-feeding How should I use this medicine? This medicine is for infusion into a vein. It is given by a health care professional in a hospital or clinic setting. Talk to your pediatrician regarding the use of this medicine in children. While this drug may be prescribed for children as young as 2 years for selected conditions, precautions do apply. Overdosage: If you think you have taken too much of this medicine contact a poison control center or emergency room at once. NOTE: This medicine is only for you. Do not share this medicine with others. What if I miss a dose? It is important not to miss your dose. Call your doctor or health care professional if you are unable to keep an appointment. What may interact with this medicine? Do not take this medicine with any of the following medications: -deferoxamine -dimercaprol -other iron products This medicine may also interact with the following medications: -chloramphenicol -deferasirox This list may not describe all possible interactions. Give your health care provider a list of all the medicines, herbs, non-prescription drugs, or dietary  supplements you use. Also tell them if you smoke, drink alcohol, or use illegal drugs. Some items may interact with your medicine. What should I watch for while using this medicine? Visit your doctor or healthcare professional regularly. Tell your doctor or healthcare professional if your symptoms do not start to get better or if they get worse. You may need blood work done while you are taking this medicine. You may need to follow a special diet. Talk to your doctor. Foods that contain iron include: whole grains/cereals, dried fruits, beans, or peas, leafy green vegetables, and organ meats (liver, kidney). What side effects may I notice from receiving this medicine? Side effects that you should report to your doctor or health care professional as soon as possible: -allergic reactions like skin rash, itching or hives, swelling of the face, lips, or tongue -breathing problems -changes in blood pressure -cough -fast, irregular heartbeat -feeling faint or lightheaded, falls -fever or chills -flushing, sweating, or hot feelings -joint or muscle aches/pains -seizures -swelling of the ankles or feet -unusually weak or tired Side effects that usually do not require medical attention (report to your doctor or health care professional if they continue or are bothersome): -diarrhea -feeling achy -headache -irritation at site where injected -nausea, vomiting -stomach upset -tiredness This list may not describe all possible side effects. Call your doctor for medical advice about side effects. You may report side effects to FDA at 1-800-FDA-1088. Where should I keep my medicine? This drug is given in a hospital or clinic and will not be stored at home. NOTE: This sheet is a summary. It may not cover all possible information. If   you have questions about this medicine, talk to your doctor, pharmacist, or health care provider.  2017 Elsevier/Gold Standard (2010-10-04 17:14:35)  

## 2016-02-01 NOTE — Progress Notes (Signed)
02/02/2016 10:41 AM   Tommy Heath 04-05-1933 [id number]  Referring provider: Sherrin Daisy, MD [address] [place], [place] [address]  Chief Complaint  Patient presents with  . Benign Prostatic Hypertrophy    New Patient    HPI: Patient is an 83 year old African American male who presents today as a referral from Dr. Kandice Robinsons for BPH with LU TS.  BPH WITH LUTS His IPSS score today is 20 which is severe lower urinary tract symptomatology. He is unhappy with his quality life due to his urinary symptoms. His PVR is 67 mL.      His major complaint today are frequency (every two hours), urgency, nocturia (every two hours), urge incontinence and a weak urinary stream.   He states he get very little amount of urine out each time he urinates.  He has had these symptoms for over one year.  He denies any dysuria, hematuria or suprapubic pain.   He currently taking tamsulosin 0.4 mg daily.  If he doesn't take the tamsulosin, he cannot urinate.    He also denies any recent fevers, chills, nausea or vomiting.  He does not have a family history of PCa.  He is drinking two 16 ounces of water daily.  He has eli mated sodas from his diet a couple months ago.  He is not prescribing fluid at night.  He has a diet heavy in salt.        IPSS    Row Name 02/02/16 1000         International Prostate Symptom Score   How often have you had the sensation of not emptying your bladder? Almost always     How often have you had to urinate less than every two hours? More than half the time     How often have you found you stopped and started again several times when you urinated? Less than 1 in 5 times     How often have you found it difficult to postpone urination? More than half the time     How often have you had a weak urinary stream? About half the time     How often have you had to strain to start urination? Not at All     How many times did you typically get up at night to urinate? 3 Times      Total IPSS Score 20       Quality of Life due to urinary symptoms   If you were to spend the rest of your life with your urinary condition just the way it is now how would you feel about that? Unhappy        Score:  1-7 Mild 8-19 Moderate 20-35 Severe     PMH: Past Medical History:  Diagnosis Date  . Allergic rhinitis   . Anemia   . AVM (arteriovenous malformation) of colon   . BPH (benign prostatic hyperplasia)   . Cataract cortical, senile   . Colon polyps 02/2005, 2012   TUBULAR ADENOMA  . Diabetes mellitus type 2, uncomplicated ([place])   . Duodenal ulcer    w/ gastric ulcer  . Glaucoma   . H. pylori infection   . History of blood product transfusion   . History of blood transfusion 02/2005   hgb was 6  . History of duodenal ulcer 02/2005   The lesion was 5 mm in largest dimension.  . Hyperlipidemia   . Hypertension   . Sleep apnea  BiPap    Surgical History: Past Surgical History:  Procedure Laterality Date  . COLONOSCOPY     07/2010, 09/09/2002, 10/15/2002, 02/16/2005, Adenomatous polyps, repeat 5 years, Dr Vira Agar  . COLONOSCOPY WITH PROPOFOL N/A 03/01/2015   Procedure: COLONOSCOPY WITH PROPOFOL;  Surgeon: Manya Silvas, MD;  Location[facility] ENDOSCOPY;  Service: Endoscopy;  Laterality: N/A;  . ESOPHAGOGASTRODUODENOSCOPY     08/17/2010, 02/16/2005, no repeat per RTE  . ESOPHAGOGASTRODUODENOSCOPY (EGD) WITH PROPOFOL N/A 03/01/2015   Procedure: ESOPHAGOGASTRODUODENOSCOPY (EGD) WITH PROPOFOL;  Surgeon: Manya Silvas, MD;  Location: [facility] ENDOSCOPY;  Service: Endoscopy;  Laterality: N/A;    Home Medications:  Allergies as of 02/02/2016   No Known Allergies     Medication List       Accurate as of 02/02/16 10:41 AM. Always use your most recent med list.          amLODipine 10 MG tablet Commonly known as:  NORVASC Reported on 03/21/2015   atorvastatin 80 MG tablet Commonly known as:  LIPITOR TAKE 1 TABLET NIGHTLY   ferrous sulfate 325 (65 FE) MG  tablet Take by mouth 2 (two) times daily with a meal.   finasteride 5 MG tablet Commonly known as:  PROSCAR Take 1 tablet (5 mg total) by mouth daily.   glimepiride 2 MG tablet Commonly known as:  AMARYL TAKE 1 TABLET DAILY WITH BREAKFAST   hydrOXYzine 25 MG tablet Commonly known as:  ATARAX/VISTARIL Take 25 mg by mouth 3 (three) times daily as needed.   JANUVIA 100 MG tablet Generic drug:  sitaGLIPtin Take 100 mg by mouth daily.   metFORMIN 1000 MG tablet Commonly known as:  GLUCOPHAGE Take 1,000 mg by mouth 2 (two) times daily with a meal.   omeprazole 20 MG capsule Commonly known as:  PRILOSEC TAKE 1 CAPSULE DAILY (CALL FOR APPOINTMENT)   pioglitazone 15 MG tablet Commonly known as:  ACTOS TAKE 1 TABLET DAILY   RA VITAMIN B-12 TR 1000 MCG Tbcr Generic drug:  Cyanocobalamin Take by mouth.   ramipril 10 MG capsule Commonly known as:  ALTACE TAKE 1 CAPSULE DAILY   tamsulosin 0.4 MG Caps capsule Commonly known as:  FLOMAX TAKE 1 CAPSULE DAILY       Allergies: No Known Allergies  Family History: Family History  Problem Relation Age of Onset  . Diabetes Mother   . Breast cancer Sister   . Prostate cancer Neg Hx   . Bladder Cancer Neg Hx   . Kidney cancer Neg Hx     Social History:  reports that he quit smoking about 23 years ago. His smoking use included Cigarettes. He has a 40.00 pack-year smoking history. He has never used smokeless tobacco. He reports that he does not drink alcohol or use drugs.  ROS: UROLOGY Frequent Urination?: Yes Hard to postpone urination?: Yes Burning/pain with urination?: No Get up at night to urinate?: Yes Leakage of urine?: Yes Urine stream starts and stops?: No Trouble starting stream?: No Do you have to strain to urinate?: No Blood in urine?: No Urinary tract infection?: No Sexually transmitted disease?: No Injury to kidneys or bladder?: No Painful intercourse?: No Weak stream?: Yes Erection problems?: Yes Penile  pain?: No  Gastrointestinal Nausea?: No Vomiting?: No Indigestion/heartburn?: No Diarrhea?: No Constipation?: No  Constitutional Fever: No Night sweats?: No Weight loss?: No Fatigue?: No  Skin Skin rash/lesions?: No Itching?: No  Eyes Blurred vision?: No Double vision?: No  Ears/Nose/Throat Sore throat?: No Sinus problems?: No  Hematologic/Lymphatic Swollen  glands?: No Easy bruising?: No  Cardiovascular Leg swelling?: No Chest pain?: No  Respiratory Cough?: No Shortness of breath?: No  Endocrine Excessive thirst?: No  Musculoskeletal Back pain?: No Joint pain?: No  Neurological Headaches?: No Dizziness?: No  Psychologic Depression?: No Anxiety?: No  Physical Exam: BP (!) 162/73   Pulse 82   Ht 5\' 6"  (1.676 m)   Wt 183 lb (83 kg)   BMI 29.54 kg/m   Constitutional: Well nourished. Alert and oriented, No acute distress. HEENT: [place] AT, moist mucus membranes. Trachea midline, no masses. Cardiovascular: No clubbing, cyanosis, or edema. Respiratory: Normal respiratory effort, no increased work of breathing. GI: Abdomen is soft, non tender, non distended, no abdominal masses. Liver and spleen not palpable.  No hernias appreciated.  Stool sample for occult testing is not indicated.   GU: No CVA tenderness.  No bladder fullness or masses.  Patient with circumcised phallus.  Urethral meatus is patent.  No penile discharge. No penile lesions or rashes. Scrotum without lesions, cysts, rashes and/or edema.  Testicles are located scrotally bilaterally. No masses are appreciated in the testicles. Left and right epididymis are normal. Rectal: Patient with  normal sphincter tone. Anus and perineum without scarring or rashes. No rectal masses are appreciated. Prostate is approximately 60 grams, no nodules are appreciated. Seminal vesicles are normal. Skin: No rashes, bruises or suspicious lesions. Lymph: No cervical or inguinal adenopathy. Neurologic: Grossly  intact, no focal deficits, moving all 4 extremities. Psychiatric: Normal mood and affect.  Laboratory Data: Lab Results  Component Value Date   WBC 7.0 01/16/2016   HGB 11.2 (L) 01/16/2016   HCT 34.9 (L) 01/16/2016   MCV 84.5 01/16/2016   PLT 284 01/16/2016    Lab Results  Component Value Date   CREATININE 1.19 01/17/2015     Lab Results  Component Value Date   AST 21 01/17/2015   Lab Results  Component Value Date   ALT 12 (L) 01/17/2015      Pertinent Imaging: Results for STEPHAN, NELIS (MRN [id number]) as of 02/02/2016 10:24  Ref. Range 02/02/2016 10:19  Scan Result Unknown 67ml    Assessment & Plan:    1. BPH with LUTS  - IPSS score is 20/5  - Continue conservative management, avoiding bladder irritants and timed voiding's  - Continue tamsulosin 0.4 mg daily  - start finasteride 5 mg daily; prescription given  - RTC in 3 months for IPS'S and PVR   2. Nocturia  - I explained to the patient that nocturia is often multi-factorial and difficult to treat.  Sleeping disorders, heart conditions, peripheral vascular disease, diabetes, an enlarged prostate for men, an urethral stricture causing bladder outlet obstruction and/or certain medications can contribute to nocturia.  - I have suggested that the patient avoid caffeine after noon and alcohol in the evening.  He or she may also benefit from fluid restrictions after 6:00 in the evening and voiding just prior to bedtime.  - I have explained that research studies have showed that over 84% of patients with sleep apnea reported frequent nighttime urination.   With sleep apnea, oxygen decreases, carbon dioxide increases, the blood become more acidic, the heart rate drops and blood vessels in the lung constrict.  The body is then alerted that something is very wrong. The sleeper must wake enough to reopen the airway. By this time, the heart is racing and experiences a false signal of fluid overload. The heart excretes a  hormone-like protein that tells the body  to get rid of sodium and water, resulting in nocturia.  -  I also informed the patient that a recent study noted that decreasing sodium intake to 2.3 grams daily, if they don't have issues with hyponatremia, can also reduce the number of nightly voids  - The patient may benefit from a discussion with his or her primary care physician to see if he or she has risk factors for sleep apnea or other sleep disturbances and obtaining a sleep study.    Return in about 3 months (around 05/02/2016) for IPSS and PVR.  These notes generated with voice recognition software. I apologize for typographical errors.  Zara Council, [facility] [address] [place], [place] [address] [phone]

## 2016-02-02 ENCOUNTER — Encounter: Payer: Self-pay | Admitting: Urology

## 2016-02-02 ENCOUNTER — Ambulatory Visit (INDEPENDENT_AMBULATORY_CARE_PROVIDER_SITE_OTHER): Payer: Medicare Other | Admitting: Urology

## 2016-02-02 VITALS — BP 162/73 | HR 82 | Ht 66.0 in | Wt 183.0 lb

## 2016-02-02 DIAGNOSIS — R351 Nocturia: Secondary | ICD-10-CM

## 2016-02-02 DIAGNOSIS — N401 Enlarged prostate with lower urinary tract symptoms: Secondary | ICD-10-CM

## 2016-02-02 DIAGNOSIS — N400 Enlarged prostate without lower urinary tract symptoms: Secondary | ICD-10-CM | POA: Diagnosis not present

## 2016-02-02 DIAGNOSIS — N138 Other obstructive and reflux uropathy: Secondary | ICD-10-CM | POA: Diagnosis not present

## 2016-02-02 LAB — BLADDER SCAN AMB NON-IMAGING

## 2016-02-02 MED ORDER — FINASTERIDE 5 MG PO TABS: 5.0000 mg | ORAL_TABLET | Freq: Every day | ORAL | 3 refills | Status: DC

## 2016-02-02 NOTE — Patient Instructions (Addendum)
DASH Eating Plan DASH stands for "Dietary Approaches to Stop Hypertension." The DASH eating plan is a healthy eating plan that has been shown to reduce high blood pressure (hypertension). Additional health benefits may include reducing the risk of type 2 diabetes mellitus, heart disease, and stroke. The DASH eating plan may also help with weight loss. What do I need to know about the DASH eating plan? For the DASH eating plan, you will follow these general guidelines:  Choose foods with less than 150 milligrams of sodium per serving (as listed on the food label).  Use salt-free seasonings or herbs instead of table salt or sea salt.  Check with your health care provider or pharmacist before using salt substitutes.  Eat lower-sodium products. These are often labeled as "low-sodium" or "no salt added."  Eat fresh foods. Avoid eating a lot of canned foods.  Eat more vegetables, fruits, and low-fat dairy products.  Choose whole grains. Look for the word "whole" as the first word in the ingredient list.  Choose fish and skinless chicken or turkey more often than red meat. Limit fish, poultry, and meat to 6 oz (170 g) each day.  Limit sweets, desserts, sugars, and sugary drinks.  Choose heart-healthy fats.  Eat more home-cooked food and less restaurant, buffet, and fast food.  Limit fried foods.  Do not fry foods. Cook foods using methods such as baking, boiling, grilling, and broiling instead.  When eating at a restaurant, ask that your food be prepared with less salt, or no salt if possible. What foods can I eat? Seek help from a dietitian for individual calorie needs. Grains  Whole grain or whole wheat bread. Brown rice. Whole grain or whole wheat pasta. Quinoa, bulgur, and whole grain cereals. Low-sodium cereals. Corn or whole wheat flour tortillas. Whole grain cornbread. Whole grain crackers. Low-sodium crackers. Vegetables  Fresh or frozen vegetables (raw, steamed, roasted, or  grilled). Low-sodium or reduced-sodium tomato and vegetable juices. Low-sodium or reduced-sodium tomato sauce and paste. Low-sodium or reduced-sodium canned vegetables. Fruits  All fresh, canned (in natural juice), or frozen fruits. Meat and Other Protein Products  Ground beef (85% or leaner), grass-fed beef, or beef trimmed of fat. Skinless chicken or turkey. Ground chicken or turkey. Pork trimmed of fat. All fish and seafood. Eggs. Dried beans, peas, or lentils. Unsalted nuts and seeds. Unsalted canned beans. Dairy  Low-fat dairy products, such as skim or 1% milk, 2% or reduced-fat cheeses, low-fat ricotta or cottage cheese, or plain low-fat yogurt. Low-sodium or reduced-sodium cheeses. Fats and Oils  Tub margarines without trans fats. Light or reduced-fat mayonnaise and salad dressings (reduced sodium). Avocado. Safflower, olive, or canola oils. Natural peanut or almond butter. Other  Unsalted popcorn and pretzels. The items listed above may not be a complete list of recommended foods or beverages. Contact your dietitian for more options.  What foods are not recommended? Grains  White bread. White pasta. White rice. Refined cornbread. Bagels and croissants. Crackers that contain trans fat. Vegetables  Creamed or fried vegetables. Vegetables in a cheese sauce. Regular canned vegetables. Regular canned tomato sauce and paste. Regular tomato and vegetable juices. Fruits  Canned fruit in light or heavy syrup. Fruit juice. Meat and Other Protein Products  Fatty cuts of meat. Ribs, chicken wings, bacon, sausage, bologna, salami, chitterlings, fatback, hot dogs, bratwurst, and packaged luncheon meats. Salted nuts and seeds. Canned beans with salt. Dairy  Whole or 2% milk, cream, half-and-half, and cream cheese. Whole-fat or sweetened yogurt. Full-fat cheeses   or blue cheese. Nondairy creamers and whipped toppings. Processed cheese, cheese spreads, or cheese curds. Condiments  Onion and garlic  salt, seasoned salt, table salt, and sea salt. Canned and packaged gravies. Worcestershire sauce. Tartar sauce. Barbecue sauce. Teriyaki sauce. Soy sauce, including reduced sodium. Steak sauce. Fish sauce. Oyster sauce. Cocktail sauce. Horseradish. Ketchup and mustard. Meat flavorings and tenderizers. Bouillon cubes. Hot sauce. Tabasco sauce. Marinades. Taco seasonings. Relishes. Fats and Oils  Butter, stick margarine, lard, shortening, ghee, and bacon fat. Coconut, palm kernel, or palm oils. Regular salad dressings. Other  Pickles and olives. Salted popcorn and pretzels. The items listed above may not be a complete list of foods and beverages to avoid. Contact your dietitian for more information.  Where can I find more information? National Heart, Lung, and Blood Institute: www.nhlbi.nih.gov/health/health-topics/topics/dash/ This information is not intended to replace advice given to you by your health care provider. Make sure you discuss any questions you have with your health care provider. Document Released: 12/13/2010 Document Revised: 06/01/2015 Document Reviewed: 10/28/2012 Elsevier Interactive Patient Education  2017 Elsevier Inc.  

## 2016-02-06 ENCOUNTER — Inpatient Hospital Stay: Payer: Medicare Other

## 2016-02-06 VITALS — BP 147/72 | HR 81 | Temp 97.6°F | Resp 20

## 2016-02-06 DIAGNOSIS — D500 Iron deficiency anemia secondary to blood loss (chronic): Secondary | ICD-10-CM

## 2016-02-06 MED ORDER — IRON SUCROSE 20 MG/ML IV SOLN
200.0000 mg | Freq: Once | INTRAVENOUS | Status: AC
  Administered 2016-02-06: 200 mg via INTRAVENOUS
  Filled 2016-02-06 (×2): qty 10

## 2016-02-06 MED ORDER — SODIUM CHLORIDE 0.9 % IV SOLN
Freq: Once | INTRAVENOUS | Status: AC
  Administered 2016-02-06: 11:00:00 via INTRAVENOUS
  Filled 2016-02-06: qty 1000

## 2016-02-13 ENCOUNTER — Inpatient Hospital Stay: Payer: Medicare Other | Attending: Internal Medicine

## 2016-02-13 VITALS — BP 143/73 | HR 71 | Temp 97.0°F | Resp 18

## 2016-02-13 DIAGNOSIS — Z8711 Personal history of peptic ulcer disease: Secondary | ICD-10-CM | POA: Insufficient documentation

## 2016-02-13 DIAGNOSIS — Z8719 Personal history of other diseases of the digestive system: Secondary | ICD-10-CM | POA: Insufficient documentation

## 2016-02-13 DIAGNOSIS — D500 Iron deficiency anemia secondary to blood loss (chronic): Secondary | ICD-10-CM | POA: Diagnosis not present

## 2016-02-13 MED ORDER — IRON SUCROSE 20 MG/ML IV SOLN
200.0000 mg | Freq: Once | INTRAVENOUS | Status: AC
  Administered 2016-02-13: 200 mg via INTRAVENOUS
  Filled 2016-02-13: qty 10

## 2016-02-13 MED ORDER — SODIUM CHLORIDE 0.9 % IV SOLN: 200.0000 mg | Freq: Once | INTRAVENOUS | Status: DC

## 2016-02-13 MED ORDER — SODIUM CHLORIDE 0.9 % IV SOLN
Freq: Once | INTRAVENOUS | Status: AC
  Administered 2016-02-13: 11:00:00 via INTRAVENOUS
  Filled 2016-02-13: qty 1000

## 2016-05-02 NOTE — Progress Notes (Signed)
05/03/2016 10:48 AM   Tommy Heath 28-Apr-1933 [id number]  Referring provider: Sherrin Daisy, MD [place], [place] [address]  Chief Complaint  Patient presents with  . Benign Prostatic Hypertrophy    3 month follow up   . Nocturia    HPI: Patient is an 83 year old African American male who presents today for a 3 month follow up after adding finasteride to his tamsulosin for BPH with LU TS.  BPH WITH LUTS His IPSS score today is 19 which is moderate lower urinary tract symptomatology. He is mostly dissatisfied with his quality life due to his urinary symptoms.  His PVR is 22 mL.   His previous I PSS score was 20/5.  His previous PVR was 67 mL.    His major complaints were frequency (every two hours), urgency, nocturia (every two hours), urge incontinence and a weak urinary stream.  He states that he has noticed a significant improvement since initiating the finasteride, but it is somewhat as his I PSS score does not reflect this.   He has had these symptoms for over one year.  He denies any dysuria, hematuria or suprapubic pain.   He currently taking tamsulosin 0.4 mg daily and finasteride 5 mg daily.   If he doesn't take the tamsulosin, he cannot urinate.    He also denies any recent fevers, chills, nausea or vomiting.  He does not have a family history of PCa.  He is drinking two 16 ounces of water daily.  He has eli mated sodas from his diet a couple months ago.  He is not decreasing fluid at night.  He has a diet heavy in salt.        IPSS    Row Name 05/03/16 1000         International Prostate Symptom Score   How often have you had the sensation of not emptying your bladder? Less than half the time     How often have you had to urinate less than every two hours? Less than half the time     How often have you found you stopped and started again several times when you urinated? More than half the time     How often have you found it difficult to postpone  urination? More than half the time     How often have you had a weak urinary stream? More than half the time     How often have you had to strain to start urination? Not at All     How many times did you typically get up at night to urinate? 3 Times     Total IPSS Score 19       Quality of Life due to urinary symptoms   If you were to spend the rest of your life with your urinary condition just the way it is now how would you feel about that? Mostly Disatisfied        Score:  1-7 Mild 8-19 Moderate 20-35 Severe     PMH: Past Medical History:  Diagnosis Date  . Allergic rhinitis   . Anemia   . AVM (arteriovenous malformation) of colon   . BPH (benign prostatic hyperplasia)   . Cataract cortical, senile   . Colon polyps 02/2005, 2012   TUBULAR ADENOMA  . Diabetes mellitus type 2, uncomplicated ([place])   . Duodenal ulcer    w/ gastric ulcer  . Glaucoma   . H. pylori infection   .  History of blood product transfusion   . History of blood transfusion 02/2005   hgb was 6  . History of duodenal ulcer 02/2005   The lesion was 5 mm in largest dimension.  . Hyperlipidemia   . Hypertension   . Sleep apnea    BiPap    Surgical History: Past Surgical History:  Procedure Laterality Date  . COLONOSCOPY     07/2010, 09/09/2002, 10/15/2002, 02/16/2005, Adenomatous polyps, repeat 5 years, Dr Vira Agar  . COLONOSCOPY WITH PROPOFOL N/A 03/01/2015   Procedure: COLONOSCOPY WITH PROPOFOL;  Surgeon: Manya Silvas, MD;  Location: [facility] ENDOSCOPY;  Service: Endoscopy;  Laterality: N/A;  . ESOPHAGOGASTRODUODENOSCOPY     08/17/2010, 02/16/2005, no repeat per RTE  . ESOPHAGOGASTRODUODENOSCOPY (EGD) WITH PROPOFOL N/A 03/01/2015   Procedure: ESOPHAGOGASTRODUODENOSCOPY (EGD) WITH PROPOFOL;  Surgeon: Manya Silvas, MD;  [facility] END[facility]COPY;  Service: Endoscopy;  Laterality: N/A;    Home Medications:  Allergies as of 05/03/2016   No Known Allergies     Medication List       Accurate as of  05/03/16 10:48 AM. Always use your most recent med list.          amLODipine 10 MG tablet Commonly known as:  NORVASC Reported on 03/21/2015   atorvastatin 80 MG tablet Commonly known as:  LIPITOR TAKE 1 TABLET NIGHTLY   ferrous sulfate 325 (65 FE) MG tablet Take by mouth 2 (two) times daily with a meal.   finasteride 5 MG tablet Commonly known as:  PROSCAR Take 1 tablet (5 mg total) by mouth daily.   glimepiride 2 MG tablet Commonly known as:  AMARYL TAKE 1 TABLET DAILY WITH BREAKFAST   hydrOXYzine 25 MG tablet Commonly known as:  ATARAX/VISTARIL Take 25 mg by mouth 3 (three) times daily as needed.   JANUVIA 100 MG tablet Generic drug:  sitaGLIPtin Take 100 mg by mouth daily.   metFORMIN 1000 MG tablet Commonly known as:  GLUCOPHAGE Take 1,000 mg by mouth 2 (two) times daily with a meal.   omeprazole 20 MG capsule Commonly known as:  PRILOSEC TAKE 1 CAPSULE DAILY (CALL FOR APPOINTMENT)   pioglitazone 15 MG tablet Commonly known as:  ACTOS TAKE 1 TABLET DAILY   RA VITAMIN B-12 TR 1000 MCG Tbcr Generic drug:  Cyanocobalamin Take by mouth.   ramipril 10 MG capsule Commonly known as:  ALTACE TAKE 1 CAPSULE DAILY   tamsulosin 0.4 MG Caps capsule Commonly known as:  FLOMAX TAKE 1 CAPSULE DAILY       Allergies: No Known Allergies  Family History: Family History  Problem Relation Age of Onset  . Diabetes Mother   . Breast cancer Sister   . Prostate cancer Neg Hx   . Bladder Cancer Neg Hx   . Kidney cancer Neg Hx     Social History:  reports that he quit smoking about 23 years ago. His smoking use included Cigarettes. He has a 40.00 pack-year smoking history. He has never used smokeless tobacco. He reports that he does not drink alcohol or use drugs.  ROS: UROLOGY Frequent Urination?: No Hard to postpone urination?: Yes Burning/pain with urination?: No Get up at night to urinate?: Yes Leakage of urine?: No Urine stream starts and stops?:  No Trouble starting stream?: No Do you have to strain to urinate?: No Blood in urine?: No Urinary tract infection?: No Sexually transmitted disease?: No Injury to kidneys or bladder?: No Painful intercourse?: No Weak stream?: No Erection problems?: No Penile pain?: No  Gastrointestinal Nausea?: No Vomiting?: No Indigestion/heartburn?: No Diarrhea?: No Constipation?: No  Constitutional Fever: No Night sweats?: No Weight loss?: No Fatigue?: No  Skin Skin rash/lesions?: No Itching?: No  Eyes Blurred vision?: No Double vision?: No  Ears/Nose/Throat Sore throat?: No Sinus problems?: No  Hematologic/Lymphatic Swollen glands?: No Easy bruising?: No  Cardiovascular Leg swelling?: No Chest pain?: No  Respiratory Cough?: No Shortness of breath?: No  Endocrine Excessive thirst?: No  Musculoskeletal Back pain?: No Joint pain?: No  Neurological Headaches?: No Dizziness?: No  Psychologic Depression?: No Anxiety?: No  Physical Exam: BP (!) 146/79   Pulse 76   Ht 5\' 6"  (1.676 m)   Wt 184 lb (83.5 kg)   BMI 29.70 kg/m   Constitutional: Well nourished. Alert and oriented, No acute distress. HEENT: [place] AT, moist mucus membranes. Trachea midline, no masses. Cardiovascular: No clubbing, cyanosis, or edema. Respiratory: Normal respiratory effort, no increased work of breathing. Skin: No rashes, bruises or suspicious lesions. Lymph: No cervical or inguinal adenopathy. Neurologic: Grossly intact, no focal deficits, moving all 4 extremities. Psychiatric: Normal mood and affect.  Laboratory Data: Lab Results  Component Value Date   WBC 7.0 01/16/2016   HGB 11.2 (L) 01/16/2016   HCT 34.9 (L) 01/16/2016   MCV 84.5 01/16/2016   PLT 284 01/16/2016    Lab Results  Component Value Date   CREATININE 1.19 01/17/2015     Lab Results  Component Value Date   AST 21 01/17/2015   Lab Results  Component Value Date   ALT 12 (L) 01/17/2015       Pertinent Imaging: Results for GALDINO, HINCHMAN (MRN [id number]) as of 05/03/2016 10:41  Ref. Range 05/03/2016 10:30  Scan Result Unknown 22     Assessment & Plan:    1. BPH with LUTS  - IPSS score is 19/4, it is improving  - Continue conservative management, avoiding bladder irritants and timed voiding's  - Continue tamsulosin 0.4 mg daily and finasteride 5 mg daily  - RTC in 6 months for I PSS and PVR   2. Nocturia  - discussed again limited fluids at night and a low sodium diet will help ease the nocturia  - patient will have his CPAP machine re calibrated and see if that helps  Return in about 6 months (around 11/02/2016) for IPSS and PVR.  These notes generated with voice recognition software. I apologize for typographical errors.  Zara Council, [facility] [address] [place], [place] [address] [phone]

## 2016-05-03 ENCOUNTER — Encounter: Payer: Self-pay | Admitting: Urology

## 2016-05-03 ENCOUNTER — Ambulatory Visit (INDEPENDENT_AMBULATORY_CARE_PROVIDER_SITE_OTHER): Payer: Medicare Other | Admitting: Urology

## 2016-05-03 VITALS — BP 146/79 | HR 76 | Ht 66.0 in | Wt 184.0 lb

## 2016-05-03 DIAGNOSIS — N138 Other obstructive and reflux uropathy: Secondary | ICD-10-CM

## 2016-05-03 DIAGNOSIS — R351 Nocturia: Secondary | ICD-10-CM | POA: Diagnosis not present

## 2016-05-03 DIAGNOSIS — N400 Enlarged prostate without lower urinary tract symptoms: Secondary | ICD-10-CM | POA: Diagnosis not present

## 2016-05-03 DIAGNOSIS — N401 Enlarged prostate with lower urinary tract symptoms: Secondary | ICD-10-CM

## 2016-05-03 LAB — BLADDER SCAN AMB NON-IMAGING: Scan Result: 22

## 2016-05-15 ENCOUNTER — Encounter
Admission: RE | Disposition: A | Discharge status: Home / Self Care | Payer: Self-pay | Source: Ambulatory Visit | Attending: Unknown Physician Specialty

## 2016-05-15 ENCOUNTER — Ambulatory Visit: Payer: Medicare Other | Admitting: Anesthesiology

## 2016-05-15 ENCOUNTER — Encounter: Payer: Self-pay | Admitting: *Deleted

## 2016-05-15 ENCOUNTER — Ambulatory Visit
Admission: RE | Admit: 2016-05-15 | Discharge: 2016-05-15 | Disposition: A | Discharge status: Home / Self Care | Payer: Medicare Other | Source: Ambulatory Visit | Attending: Unknown Physician Specialty | Admitting: Unknown Physician Specialty

## 2016-05-15 DIAGNOSIS — I10 Essential (primary) hypertension: Secondary | ICD-10-CM | POA: Insufficient documentation

## 2016-05-15 DIAGNOSIS — Z79899 Other long term (current) drug therapy: Secondary | ICD-10-CM | POA: Diagnosis not present

## 2016-05-15 DIAGNOSIS — J309 Allergic rhinitis, unspecified: Secondary | ICD-10-CM | POA: Diagnosis not present

## 2016-05-15 DIAGNOSIS — G4730 Sleep apnea, unspecified: Secondary | ICD-10-CM | POA: Insufficient documentation

## 2016-05-15 DIAGNOSIS — D649 Anemia, unspecified: Secondary | ICD-10-CM | POA: Insufficient documentation

## 2016-05-15 DIAGNOSIS — Z833 Family history of diabetes mellitus: Secondary | ICD-10-CM | POA: Insufficient documentation

## 2016-05-15 DIAGNOSIS — K449 Diaphragmatic hernia without obstruction or gangrene: Secondary | ICD-10-CM | POA: Insufficient documentation

## 2016-05-15 DIAGNOSIS — Z803 Family history of malignant neoplasm of breast: Secondary | ICD-10-CM | POA: Insufficient documentation

## 2016-05-15 DIAGNOSIS — Z8711 Personal history of peptic ulcer disease: Secondary | ICD-10-CM | POA: Diagnosis present

## 2016-05-15 DIAGNOSIS — H409 Unspecified glaucoma: Secondary | ICD-10-CM | POA: Insufficient documentation

## 2016-05-15 DIAGNOSIS — Z8719 Personal history of other diseases of the digestive system: Secondary | ICD-10-CM | POA: Insufficient documentation

## 2016-05-15 DIAGNOSIS — Z7984 Long term (current) use of oral hypoglycemic drugs: Secondary | ICD-10-CM | POA: Diagnosis not present

## 2016-05-15 DIAGNOSIS — N400 Enlarged prostate without lower urinary tract symptoms: Secondary | ICD-10-CM | POA: Insufficient documentation

## 2016-05-15 DIAGNOSIS — E785 Hyperlipidemia, unspecified: Secondary | ICD-10-CM | POA: Diagnosis not present

## 2016-05-15 DIAGNOSIS — Z86010 Personal history of colonic polyps: Secondary | ICD-10-CM | POA: Diagnosis not present

## 2016-05-15 DIAGNOSIS — Z87891 Personal history of nicotine dependence: Secondary | ICD-10-CM | POA: Insufficient documentation

## 2016-05-15 DIAGNOSIS — E119 Type 2 diabetes mellitus without complications: Secondary | ICD-10-CM | POA: Insufficient documentation

## 2016-05-15 HISTORY — PX: ESOPHAGOGASTRODUODENOSCOPY (EGD) WITH PROPOFOL: SHX5813

## 2016-05-15 LAB — GLUCOSE, CAPILLARY: GLUCOSE-CAPILLARY: 93 mg/dL (ref 65–99)

## 2016-05-15 SURGERY — ESOPHAGOGASTRODUODENOSCOPY (EGD) WITH PROPOFOL
Anesthesia: General

## 2016-05-15 MED ORDER — GLYCOPYRROLATE 0.2 MG/ML IJ SOLN
INTRAMUSCULAR | Status: AC
  Filled 2016-05-15: qty 1

## 2016-05-15 MED ORDER — LIDOCAINE HCL 2 % IJ SOLN
INTRAMUSCULAR | Status: AC
  Filled 2016-05-15: qty 10

## 2016-05-15 MED ORDER — PROPOFOL 500 MG/50ML IV EMUL
INTRAVENOUS | Status: DC | PRN
  Administered 2016-05-15: 75 ug/kg/min via INTRAVENOUS

## 2016-05-15 MED ORDER — PROPOFOL 500 MG/50ML IV EMUL
INTRAVENOUS | Status: AC
  Filled 2016-05-15: qty 50

## 2016-05-15 MED ORDER — SODIUM CHLORIDE 0.9 % IV SOLN
INTRAVENOUS | Status: DC | PRN
  Administered 2016-05-15: 08:00:00 via INTRAVENOUS

## 2016-05-15 MED ORDER — SODIUM CHLORIDE 0.9 % IV SOLN: INTRAVENOUS | Status: DC

## 2016-05-15 MED ORDER — FENTANYL CITRATE (PF) 100 MCG/2ML IJ SOLN
INTRAMUSCULAR | Status: DC | PRN
  Administered 2016-05-15: 50 ug via INTRAVENOUS

## 2016-05-15 MED ORDER — FENTANYL CITRATE (PF) 100 MCG/2ML IJ SOLN
INTRAMUSCULAR | Status: AC
  Filled 2016-05-15: qty 2

## 2016-05-15 NOTE — Op Note (Signed)
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 Providers:            Manya Silvas, MD Referring MD:         Shirline Frees MD, MD (Referring MD) Medicines:            Propofol per Anesthesia Complications:        No immediate complications. Procedure:            Pre-Anesthesia Assessment:                       - After reviewing the risks and benefits, the patient                        was deemed in satisfactory condition to undergo the                        procedure.                       After obtaining informed consent, the endoscope was                        passed under direct vision. Throughout the procedure,                        the patient's blood pressure, pulse, and oxygen                        saturations were monitored continuously. The Endoscope                        was introduced through the mouth, and advanced to the                        second part of duodenum. The upper GI endoscopy was                        accomplished without difficulty. The patient tolerated                        the procedure well. Findings:      The initial attempt to scope him was aborted due to drop in oxygen       saturation for a very short time and he was treated with rapid return of       normal oxygen level.      The examined esophagus was normal.      A small hiatal hernia was present.      The examined duodenum was normal. No sign of AVM or blood.      Patchy mild inflammation characterized by erythema and granularity was       found  in the gastric body. Impression:           - Normal esophagus.              - Small hiatal hernia.                       - Normal stomach.                       - Normal examined duodenum.                       - No specimens collected. Recommendation:       - The findings and recommendations were discussed with                        the patient's family. Stay on iron as needed with                        hematologists. Continue Omeprazole for gastritis. Manya Silvas, MD 05/15/2016 8:38:55 AM This report has been signed electronically. Number of Addenda: 0 Note Initiated On: 05/15/2016 8:15 AM      [facility]

## 2016-05-15 NOTE — Anesthesia Preprocedure Evaluation (Signed)
Anesthesia Evaluation  Patient identified by MRN, date of birth, ID band Patient awake    Reviewed: Allergy & Precautions, NPO status , Patient's Chart, lab work & pertinent test results  Airway Mallampati: II       Dental  (+) Teeth Intact   Pulmonary sleep apnea , former smoker,     + decreased breath sounds      Cardiovascular Exercise Tolerance: Good hypertension, Pt. on medications  Rhythm:Regular     Neuro/Psych negative neurological ROS     GI/Hepatic PUD,   Endo/Other  diabetes, Type 2, Oral Hypoglycemic Agents  Renal/GU      Musculoskeletal   Abdominal Normal abdominal exam  (+)   Peds negative pediatric ROS (+)  Hematology  (+) anemia ,   Anesthesia Other Findings   Reproductive/Obstetrics                             Anesthesia Physical Anesthesia Plan  ASA: III  Anesthesia Plan: General   Post-op Pain Management:    Induction: Intravenous  Airway Management Planned: Natural Airway and Nasal Cannula  Additional Equipment:   Intra-op Plan:   Post-operative Plan:   Informed Consent: I have reviewed the patients History and Physical, chart, labs and discussed the procedure including the risks, benefits and alternatives for the proposed anesthesia with the patient or authorized representative who has indicated his/her understanding and acceptance.     Plan Discussed with: CRNA  Anesthesia Plan Comments:         Anesthesia Quick Evaluation

## 2016-05-15 NOTE — Anesthesia Post-op Follow-up Note (Cosign Needed)
Anesthesia QCDR form completed.        

## 2016-05-15 NOTE — Anesthesia Postprocedure Evaluation (Signed)
Anesthesia Post Note  Patient: Tommy Heath  Procedure(s) Performed: Procedure(s) (LRB): ESOPHAGOGASTRODUODENOSCOPY (EGD) WITH PROPOFOL (N/A)  Patient location during evaluation: PACU Anesthesia Type: General Level of consciousness: awake Pain management: pain level controlled Vital Signs Assessment: post-procedure vital signs reviewed and stable Respiratory status: nonlabored ventilation Cardiovascular status: blood pressure returned to baseline Anesthetic complications: no     Last Vitals:  Vitals:   05/15/16 0735 05/15/16 0843  BP: (!) 176/84 138/65  Pulse: 71 84  Resp: 16 17  Temp: (!) 35.6 C 36.7 C    Last Pain:  Vitals:   05/15/16 0843  TempSrc: Tympanic                 VAN STAVEREN,Onita Pfluger

## 2016-05-15 NOTE — H&P (Signed)
Primary Care Physician:  Sherrin Daisy, MD Primary Gastroenterologist:  Dr. Vira Agar  Pre-Procedure History & Physical: HPI:  Tommy Heath is a 83 y.o. male is here for an endoscopy.   Past Medical History:  Diagnosis Date  . Allergic rhinitis   . Anemia   . AVM (arteriovenous malformation) of colon   . BPH (benign prostatic hyperplasia)   . Cataract cortical, senile   . Colon polyps 02/2005, 2012   TUBULAR ADENOMA  . Diabetes mellitus type 2, uncomplicated ([place])   . Duodenal ulcer    w/ gastric ulcer  . Glaucoma   . H. pylori infection   . H. pylori infection   . History of blood product transfusion   . History of blood transfusion 02/2005   hgb was 6  . History of duodenal ulcer 02/2005   The lesion was 5 mm in largest dimension.  . Hyperlipidemia   . Hypertension   . Sleep apnea    BiPap    Past Surgical History:  Procedure Laterality Date  . COLONOSCOPY     07/2010, 09/09/2002, 10/15/2002, 02/16/2005, Adenomatous polyps, repeat 5 years, Dr Vira Agar  . COLONOSCOPY WITH PROPOFOL N/A 03/01/2015   Procedure: COLONOSCOPY WITH PROPOFOL;  Surgeon: Manya Silvas, MD;  Location: [facility] ENDOSCOPY;  Service: Endoscopy;  Laterality: N/A;  . ESOPHAGOGASTRODUODENOSCOPY     08/17/2010, 02/16/2005, no repeat per RTE  . ESOPHAGOGASTRODUODENOSCOPY (EGD) WITH PROPOFOL N/A 03/01/2015   Procedure: ESOPHAGOGASTRODUODENOSCOPY (EGD) WITH PROPOFOL;  Surgeon: Manya Silvas, MD;  Location: [facility] ENDOSCOPY;  Service: Endoscopy;  Laterality: N/A;    Prior to Admission medications   Medication Sig Start Date End Date Taking? Authorizing Provider  amLODipine (NORVASC) 10 MG tablet Reported on 03/21/2015 07/27/14  Yes [provider]  atorvastatin (LIPITOR) 80 MG tablet TAKE 1 TABLET NIGHTLY 07/19/14  Yes [provider]  Cyanocobalamin (RA VITAMIN B-12 TR) 1000 MCG TBCR Take by mouth.   Yes [provider]  ferrous sulfate 325 (65 FE) MG tablet Take by mouth 2 (two) times  daily with a meal.    Yes [provider]  finasteride (PROSCAR) 5 MG tablet Take 1 tablet (5 mg total) by mouth daily. 02/02/16  Yes McGowan, Larene Beach A, PA-C  glimepiride (AMARYL) 2 MG tablet TAKE 1 TABLET DAILY WITH BREAKFAST 12/22/14  Yes [provider]  hydrOXYzine (ATARAX/VISTARIL) 25 MG tablet Take 25 mg by mouth 3 (three) times daily as needed.   Yes [provider]  metFORMIN (GLUCOPHAGE) 1000 MG tablet Take 1,000 mg by mouth 2 (two) times daily with a meal.  12/27/14  Yes [provider]  omeprazole (PRILOSEC) 20 MG capsule TAKE 1 CAPSULE DAILY (CALL FOR APPOINTMENT) 07/19/14  Yes [provider]  pioglitazone (ACTOS) 15 MG tablet TAKE 1 TABLET DAILY 12/12/14  Yes [provider]  ramipril (ALTACE) 10 MG capsule TAKE 1 CAPSULE DAILY 07/27/14  Yes [provider]  tamsulosin (FLOMAX) 0.4 MG CAPS capsule TAKE 1 CAPSULE DAILY 12/08/14  Yes [provider]  triamcinolone (KENALOG) 0.025 % cream Apply 1 application topically 2 (two) times daily.   Yes [provider]  sitaGLIPtin (JANUVIA) 100 MG tablet Take 100 mg by mouth daily.  06/28/14   [provider]    Allergies as of 03/18/2016  . (No Known Allergies)    Family History  Problem Relation Age of Onset  . Diabetes Mother   . Breast cancer Sister   . Prostate cancer Neg Hx   .  Bladder Cancer Neg Hx   . Kidney cancer Neg Hx     Social History   Social History  . Marital status: Single    Spouse name: N/A  . Number of children: N/A  . Years of education: N/A   Occupational History  . Not on file.   Social History Main Topics  . Smoking status: Former Smoker    Packs/day: 1.00    Years: 40.00    Types: Cigarettes    Quit date: 01/07/1993  . Smokeless tobacco: Never Used  . Alcohol use No  . Drug use: No  . Sexual activity: No   Other Topics Concern  . Not on file   Social History Narrative  . No narrative on file    Review  of Systems: See HPI, otherwise negative ROS  Physical Exam: BP (!) 176/84   Pulse 71   Temp (!) 96 F (35.6 C) (Tympanic)   Resp 16   Wt 82.6 kg (182 lb)   SpO2 100%   BMI 29.38 kg/m  General:   Alert,  pleasant and cooperative in NAD Head:  Normocephalic and atraumatic. Neck:  Supple; no masses or thyromegaly. Lungs:  Clear throughout to auscultation.    Heart:  Regular rate and rhythm. Abdomen:  Soft, nontender and nondistended. Normal bowel sounds, without guarding, and without rebound.   Neurologic:  Alert and  oriented x4;  grossly normal neurologically.  Impression/Plan: Tommy Heath is here for an endoscopy to be performed for follow up AVM of small bowel  Risks, benefits, limitations, and alternatives regarding  endoscopy have been reviewed with the patient.  Questions have been answered.  All parties agreeable.   Gaylyn Cheers, MD  05/15/2016, 8:13 AM

## 2016-05-15 NOTE — Anesthesia Procedure Notes (Signed)
Performed by: COOK-MARTIN, Sawsan Riggio Pre-anesthesia Checklist: Patient identified, Emergency Drugs available, Suction available, Patient being monitored and Timeout performed Patient Re-evaluated:Patient Re-evaluated prior to inductionOxygen Delivery Method: Nasal cannula Preoxygenation: Pre-oxygenation with 100% oxygen Intubation Type: IV induction Airway Equipment and Method: Bite block Placement Confirmation: CO2 detector and positive ETCO2     

## 2016-05-15 NOTE — Transfer of Care (Signed)
Immediate Anesthesia Transfer of Care Note  Patient: Tommy Heath  Procedure(s) Performed: Procedure(s): ESOPHAGOGASTRODUODENOSCOPY (EGD) WITH PROPOFOL (N/A)  Patient Location: PACU  Anesthesia Type:General  Level of Consciousness: awake and sedated  Airway & Oxygen Therapy: Patient Spontanous Breathing and Patient connected to nasal cannula oxygen  Post-op Assessment: Report given to RN and Post -op Vital signs reviewed and stable  Post vital signs: Reviewed and stable  Last Vitals:  Vitals:   05/15/16 0735  BP: (!) 176/84  Pulse: 71  Resp: 16  Temp: (!) 35.6 C    Last Pain:  Vitals:   05/15/16 0735  TempSrc: Tympanic         Complications: No apparent anesthesia complications

## 2016-05-17 ENCOUNTER — Encounter: Payer: Self-pay | Admitting: Unknown Physician Specialty

## 2016-07-16 ENCOUNTER — Inpatient Hospital Stay: Payer: Medicare Other | Attending: Internal Medicine

## 2016-07-16 DIAGNOSIS — I10 Essential (primary) hypertension: Secondary | ICD-10-CM | POA: Diagnosis not present

## 2016-07-16 DIAGNOSIS — Z7984 Long term (current) use of oral hypoglycemic drugs: Secondary | ICD-10-CM | POA: Insufficient documentation

## 2016-07-16 DIAGNOSIS — Z9989 Dependence on other enabling machines and devices: Secondary | ICD-10-CM | POA: Insufficient documentation

## 2016-07-16 DIAGNOSIS — D500 Iron deficiency anemia secondary to blood loss (chronic): Secondary | ICD-10-CM | POA: Insufficient documentation

## 2016-07-16 DIAGNOSIS — R5383 Other fatigue: Secondary | ICD-10-CM | POA: Insufficient documentation

## 2016-07-16 DIAGNOSIS — G4730 Sleep apnea, unspecified: Secondary | ICD-10-CM | POA: Diagnosis not present

## 2016-07-16 DIAGNOSIS — Z79899 Other long term (current) drug therapy: Secondary | ICD-10-CM | POA: Insufficient documentation

## 2016-07-16 DIAGNOSIS — Z803 Family history of malignant neoplasm of breast: Secondary | ICD-10-CM | POA: Diagnosis not present

## 2016-07-16 DIAGNOSIS — E119 Type 2 diabetes mellitus without complications: Secondary | ICD-10-CM | POA: Diagnosis not present

## 2016-07-16 DIAGNOSIS — Z87891 Personal history of nicotine dependence: Secondary | ICD-10-CM | POA: Diagnosis not present

## 2016-07-16 DIAGNOSIS — Z8711 Personal history of peptic ulcer disease: Secondary | ICD-10-CM | POA: Diagnosis not present

## 2016-07-16 DIAGNOSIS — Z86010 Personal history of colonic polyps: Secondary | ICD-10-CM | POA: Diagnosis not present

## 2016-07-16 DIAGNOSIS — Q2733 Arteriovenous malformation of digestive system vessel: Secondary | ICD-10-CM | POA: Insufficient documentation

## 2016-07-16 DIAGNOSIS — E785 Hyperlipidemia, unspecified: Secondary | ICD-10-CM | POA: Insufficient documentation

## 2016-07-16 DIAGNOSIS — Z8719 Personal history of other diseases of the digestive system: Secondary | ICD-10-CM | POA: Insufficient documentation

## 2016-07-16 DIAGNOSIS — N400 Enlarged prostate without lower urinary tract symptoms: Secondary | ICD-10-CM | POA: Insufficient documentation

## 2016-07-16 LAB — CBC WITH DIFFERENTIAL/PLATELET
BASOS PCT: 2 %
Basophils Absolute: 0.1 10*3/uL (ref 0–0.1)
EOS ABS: 0.2 10*3/uL (ref 0–0.7)
EOS PCT: 3 %
HCT: 32.5 % — ABNORMAL LOW (ref 40.0–52.0)
HEMOGLOBIN: 10.6 g/dL — AB (ref 13.0–18.0)
LYMPHS ABS: 1.1 10*3/uL (ref 1.0–3.6)
Lymphocytes Relative: 17 %
MCH: 28 pg (ref 26.0–34.0)
MCHC: 32.7 g/dL (ref 32.0–36.0)
MCV: 85.6 fL (ref 80.0–100.0)
MONO ABS: 0.5 10*3/uL (ref 0.2–1.0)
MONOS PCT: 7 %
NEUTROS PCT: 71 %
Neutro Abs: 4.8 10*3/uL (ref 1.4–6.5)
Platelets: 279 10*3/uL (ref 150–440)
RBC: 3.79 MIL/uL — ABNORMAL LOW (ref 4.40–5.90)
RDW: 15 % — AB (ref 11.5–14.5)
WBC: 6.7 10*3/uL (ref 3.8–10.6)

## 2016-07-16 LAB — IRON AND TIBC
IRON: 47 ug/dL (ref 45–182)
SATURATION RATIOS: 15 % — AB (ref 17.9–39.5)
TIBC: 318 ug/dL (ref 250–450)
UIBC: 271 ug/dL

## 2016-07-16 LAB — FERRITIN: FERRITIN: 23 ng/mL — AB (ref 24–336)

## 2016-07-23 ENCOUNTER — Inpatient Hospital Stay: Payer: Medicare Other

## 2016-07-23 ENCOUNTER — Inpatient Hospital Stay (HOSPITAL_BASED_OUTPATIENT_CLINIC_OR_DEPARTMENT_OTHER): Payer: Medicare Other | Admitting: Internal Medicine

## 2016-07-23 VITALS — BP 146/59 | HR 65 | Temp 98.0°F | Resp 20 | Ht 66.0 in | Wt 180.8 lb

## 2016-07-23 VITALS — BP 140/58 | HR 58 | Temp 97.5°F | Resp 18

## 2016-07-23 DIAGNOSIS — G4730 Sleep apnea, unspecified: Secondary | ICD-10-CM

## 2016-07-23 DIAGNOSIS — R5383 Other fatigue: Secondary | ICD-10-CM

## 2016-07-23 DIAGNOSIS — D500 Iron deficiency anemia secondary to blood loss (chronic): Secondary | ICD-10-CM

## 2016-07-23 DIAGNOSIS — Z8719 Personal history of other diseases of the digestive system: Secondary | ICD-10-CM

## 2016-07-23 DIAGNOSIS — Z86010 Personal history of colonic polyps: Secondary | ICD-10-CM

## 2016-07-23 DIAGNOSIS — E785 Hyperlipidemia, unspecified: Secondary | ICD-10-CM

## 2016-07-23 DIAGNOSIS — Z9989 Dependence on other enabling machines and devices: Secondary | ICD-10-CM | POA: Diagnosis not present

## 2016-07-23 DIAGNOSIS — Q2733 Arteriovenous malformation of digestive system vessel: Secondary | ICD-10-CM | POA: Diagnosis not present

## 2016-07-23 DIAGNOSIS — Z87891 Personal history of nicotine dependence: Secondary | ICD-10-CM

## 2016-07-23 DIAGNOSIS — E119 Type 2 diabetes mellitus without complications: Secondary | ICD-10-CM

## 2016-07-23 DIAGNOSIS — I10 Essential (primary) hypertension: Secondary | ICD-10-CM

## 2016-07-23 DIAGNOSIS — Z8711 Personal history of peptic ulcer disease: Secondary | ICD-10-CM | POA: Diagnosis not present

## 2016-07-23 DIAGNOSIS — Z803 Family history of malignant neoplasm of breast: Secondary | ICD-10-CM

## 2016-07-23 DIAGNOSIS — Z79899 Other long term (current) drug therapy: Secondary | ICD-10-CM

## 2016-07-23 DIAGNOSIS — Z7984 Long term (current) use of oral hypoglycemic drugs: Secondary | ICD-10-CM | POA: Diagnosis not present

## 2016-07-23 DIAGNOSIS — N400 Enlarged prostate without lower urinary tract symptoms: Secondary | ICD-10-CM

## 2016-07-23 MED ORDER — SODIUM CHLORIDE 0.9 % IV SOLN
Freq: Once | INTRAVENOUS | Status: AC
  Administered 2016-07-23: 11:00:00 via INTRAVENOUS
  Filled 2016-07-23: qty 1000

## 2016-07-23 MED ORDER — IRON SUCROSE 20 MG/ML IV SOLN
200.0000 mg | Freq: Once | INTRAVENOUS | Status: AC
  Filled 2016-07-23: qty 10

## 2016-07-23 MED ORDER — SODIUM CHLORIDE 0.9 % IV SOLN
200.0000 mg | Freq: Once | INTRAVENOUS | Status: DC
  Administered 2016-07-23: 200 mg via INTRAVENOUS

## 2016-07-23 NOTE — Assessment & Plan Note (Addendum)
#   Severe Iron deficiency Anemia- likely from chronic GI blood loss. 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  On PO iron. Hb- today 10.6; sat 15%. Patient is symptomatic.  # Proceed with IV Venofer today and weekly for 4 doses.   # Follow-up with [place] CBC and iron studies/ done a few days prior to the visit- in 6 months.

## 2016-07-23 NOTE — Progress Notes (Signed)
[place] OFFICE PROGRESS NOTE  Patient Care Team: Sherrin Daisy, MD as PCP - General (Family Medicine)   SUMMARY OF ONCOLOGIC HISTORY:  # 2009- IRON DEFICIENCY ANEMIA-likely secondary to upper GI bleed; EGD [gastric ulcer & duodenal AV malformations] & Colonoscopy 2007& 2012 [Dr.Elliot];   # JAN 2017- Hb-8.8/Ferritin-7; IV Venofer 300mg  x4 x ;FEB 2017 [Dr.Elliot non-bleeding angioectasias in the duodenum & Colon s/p argon plasma coagulation. PO Iron BID  INTERVAL HISTORY:  83 -year-old male patient with with above history of iron deficiency anemia likely from chronic GI blood loss/  Secondary to above GI blood loss is here for follow-up.  Patient admits to mild increase in fatigue over the last few weeks. He continues spent by mouth iron. Otherwise denies any blood in stools.  He denies any chest pain or shortness of breath or cough.  REVIEW OF SYSTEMS:  A complete 10 point review of system is done which is negative except mentioned above/history of present illness.   PAST MEDICAL HISTORY :  Past Medical History:  Diagnosis Date  . Allergic rhinitis   . Anemia   . AVM (arteriovenous malformation) of colon   . BPH (benign prostatic hyperplasia)   . Cataract cortical, senile   . Colon polyps 02/2005, 2012   TUBULAR ADENOMA  . Diabetes mellitus type 2, uncomplicated ([place])   . Duodenal ulcer    w/ gastric ulcer  . Glaucoma   . H. pylori infection   . H. pylori infection   . History of blood product transfusion   . History of blood transfusion 02/2005   hgb was 6  . History of duodenal ulcer 02/2005   The lesion was 5 mm in largest dimension.  . Hyperlipidemia   . Hypertension   . Sleep apnea    BiPap    PAST SURGICAL HISTORY :   Past Surgical History:  Procedure Laterality Date  . COLONOSCOPY     07/2010, 09/09/2002, 10/15/2002, 02/16/2005, Adenomatous polyps, repeat 5 years, Dr Vira Agar  . COLONOSCOPY WITH PROPOFOL N/A 03/01/2015   Procedure: COLONOSCOPY  WITH PROPOFOL;  Surgeon: Manya Silvas, MD;  Location: [facility] ENDOSCOPY;  Service: Endoscopy;  Laterality: N/A;  . ESOPHAGOGASTRODUODENOSCOPY     08/17/2010, 02/16/2005, no repeat per RTE  . ESOPHAGOGASTRODUODENOSCOPY (EGD) WITH PROPOFOL N/A 03/01/2015   Procedure: ESOPHAGOGASTRODUODENOSCOPY (EGD) WITH PROPOFOL;  Surgeon: Manya Silvas, MD;  Location: [facility] ENDOSCOPY;  Service: Endoscopy;  Laterality: N/A;  . ESOPHAGOGASTRODUODENOSCOPY (EGD) WITH PROPOFOL N/A 05/15/2016   Procedure: ESOPHAGOGASTRODUODENOSCOPY (EGD) WITH PROPOFOL;  Surgeon: Manya Silvas, MD;  Location: [facility] ENDOSCOPY;  Service: Endoscopy;  Laterality: N/A;    FAMILY HISTORY :   Family History  Problem Relation Age of Onset  . Diabetes Mother   . Breast cancer Sister   . Prostate cancer Neg Hx   . Bladder Cancer Neg Hx   . Kidney cancer Neg Hx     SOCIAL HISTORY:   Social History  Substance Use Topics  . Smoking status: Former Smoker    Packs/day: 1.00    Years: 40.00    Types: Cigarettes    Quit date: 01/07/1993  . Smokeless tobacco: Never Used  . Alcohol use No    ALLERGIES:  has No Known Allergies.  MEDICATIONS:  Current Outpatient Prescriptions  Medication Sig Dispense Refill  . amLODipine (NORVASC) 10 MG tablet Reported on 03/21/2015    . Cyanocobalamin (RA VITAMIN B-12 TR) 1000 MCG TBCR Take by mouth.    . ferrous sulfate  325 (65 FE) MG tablet Take by mouth 2 (two) times daily with a meal.     . finasteride (PROSCAR) 5 MG tablet Take 1 tablet (5 mg total) by mouth daily. 90 tablet 3  . glimepiride (AMARYL) 2 MG tablet TAKE 1 TABLET DAILY WITH BREAKFAST    . metFORMIN (GLUCOPHAGE) 1000 MG tablet Take 1,000 mg by mouth 2 (two) times daily with a meal.     . omeprazole (PRILOSEC) 20 MG capsule TAKE 1 CAPSULE DAILY (CALL FOR APPOINTMENT)    . pioglitazone (ACTOS) 15 MG tablet TAKE 1 TABLET DAILY    . ramipril (ALTACE) 10 MG capsule TAKE 1 CAPSULE DAILY    . sitaGLIPtin (JANUVIA) 100 MG tablet Take 100 mg  by mouth daily.     . tamsulosin (FLOMAX) 0.4 MG CAPS capsule TAKE 1 CAPSULE DAILY    . triamcinolone (KENALOG) 0.025 % cream Apply 1 application topically 2 (two) times daily as needed.      No current facility-administered medications for this visit.    Facility-Administered Medications Ordered in Other Visits  Medication Dose Route Frequency Provider Last Rate Last Dose  . iron sucrose (VENOFER) injection 200 mg  200 mg Intravenous Once Cammie Sickle, MD        PHYSICAL EXAMINATION:   BP (!) 146/59 (BP Location: Left Arm, Patient Position: Sitting)   Pulse 65   Temp 98 F (36.7 C) (Oral)   Resp 20   Ht 5\' 6"  (1.676 m)   Wt 180 lb 12.4 oz (82 kg)   BMI 29.18 kg/m   Filed Weights   07/23/16 1022  Weight: 180 lb 12.4 oz (82 kg)     GENERAL: Well-nourished well-developed; Alert, no distress and comfortable. Alone.  EYES: No pallor no icterus. OROPHARYNX: no thrush or ulceration; good dentition  NECK: supple, no masses felt LYMPH: no palpable lymphadenopathy in the cervical, axillary or inguinal regions LUNGS: clear to auscultation and No wheeze or crackles HEART/CVS: regular rate & rhythm and no murmurs; No lower extremity edema ABDOMEN: abdomen soft, non-tender and normal bowel sounds Musculoskeletal:no cyanosis of digits and no clubbing  PSYCH: alert & oriented x 3 with fluent speech NEURO: no focal motor/sensory deficits SKIN: no rashes or significant lesions  LABORATORY DATA:  I have reviewed the data as listed    Component Value Date/Time   NA 138 01/17/2015 1439   K 3.7 01/17/2015 1439   CL 105 01/17/2015 1439   CO2 27 01/17/2015 1439   GLUCOSE 133 (H) 01/17/2015 1439   BUN 17 01/17/2015 1439   CREATININE 1.19 01/17/2015 1439   CALCIUM 9.5 01/17/2015 1439   PROT 7.8 01/17/2015 1439   ALBUMIN 3.9 01/17/2015 1439   AST 21 01/17/2015 1439   ALT 12 (L) 01/17/2015 1439   ALKPHOS 60 01/17/2015 1439   BILITOT 0.5 01/17/2015 1439   GFRNONAA 56  (L) 01/17/2015 1439   GFRAA >60 01/17/2015 1439    No results found for: SPEP, UPEP  Lab Results  Component Value Date   WBC 6.7 07/16/2016   NEUTROABS 4.8 07/16/2016   HGB 10.6 (L) 07/16/2016   HCT 32.5 (L) 07/16/2016   MCV 85.6 07/16/2016   PLT 279 07/16/2016      Chemistry      Component Value Date/Time   NA 138 01/17/2015 1439   K 3.7 01/17/2015 1439   CL 105 01/17/2015 1439   CO2 27 01/17/2015 1439   BUN 17 01/17/2015 1439   CREATININE 1.19 01/17/2015  1439      Component Value Date/Time   CALCIUM 9.5 01/17/2015 1439   ALKPHOS 60 01/17/2015 1439   AST 21 01/17/2015 1439   ALT 12 (L) 01/17/2015 1439   BILITOT 0.5 01/17/2015 1439       ASSESSMENT & PLAN:  Iron deficiency anemia due to chronic blood loss # Severe Iron deficiency Anemia- likely from chronic GI blood loss. 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  On PO iron. Hb- today 10.6; sat 15%. Patient is symptomatic.  # Proceed with IV Venofer today and weekly for 4 doses.   # Follow-up with [place] CBC and iron studies/ done a few days prior to the visit- in 6 months.     Cammie Sickle, MD 07/23/2016 6:08 PM

## 2016-07-31 ENCOUNTER — Inpatient Hospital Stay: Payer: Medicare Other

## 2016-07-31 VITALS — BP 120/65 | HR 68 | Temp 96.9°F | Resp 18

## 2016-07-31 DIAGNOSIS — D500 Iron deficiency anemia secondary to blood loss (chronic): Secondary | ICD-10-CM

## 2016-07-31 MED ORDER — IRON SUCROSE 20 MG/ML IV SOLN
200.0000 mg | Freq: Once | INTRAVENOUS | Status: AC
  Administered 2016-07-31: 200 mg via INTRAVENOUS

## 2016-07-31 MED ORDER — SODIUM CHLORIDE 0.9 % IV SOLN
Freq: Once | INTRAVENOUS | Status: AC
  Administered 2016-07-31: 10:00:00 via INTRAVENOUS
  Filled 2016-07-31: qty 1000

## 2016-07-31 MED ORDER — SODIUM CHLORIDE 0.9 % IV SOLN
200.0000 mg | Freq: Once | INTRAVENOUS | Status: DC
  Administered 2016-07-31: 200 mg via INTRAVENOUS

## 2016-08-07 ENCOUNTER — Inpatient Hospital Stay: Payer: Medicare Other | Attending: Internal Medicine

## 2016-08-07 VITALS — BP 142/64 | HR 60 | Temp 96.9°F | Resp 18

## 2016-08-07 DIAGNOSIS — D500 Iron deficiency anemia secondary to blood loss (chronic): Secondary | ICD-10-CM

## 2016-08-07 DIAGNOSIS — Q2733 Arteriovenous malformation of digestive system vessel: Secondary | ICD-10-CM | POA: Insufficient documentation

## 2016-08-07 DIAGNOSIS — Z8719 Personal history of other diseases of the digestive system: Secondary | ICD-10-CM | POA: Insufficient documentation

## 2016-08-07 DIAGNOSIS — Z8711 Personal history of peptic ulcer disease: Secondary | ICD-10-CM | POA: Insufficient documentation

## 2016-08-07 MED ORDER — IRON SUCROSE 20 MG/ML IV SOLN
200.0000 mg | Freq: Once | INTRAVENOUS | Status: AC
  Administered 2016-08-07: 200 mg via INTRAVENOUS
  Filled 2016-08-07: qty 10

## 2016-08-07 MED ORDER — SODIUM CHLORIDE 0.9 % IV SOLN
Freq: Once | INTRAVENOUS | Status: AC
  Administered 2016-08-07: 11:00:00 via INTRAVENOUS
  Filled 2016-08-07: qty 1000

## 2016-08-07 NOTE — Patient Instructions (Signed)

## 2016-08-14 ENCOUNTER — Inpatient Hospital Stay: Payer: Medicare Other

## 2016-08-14 VITALS — BP 175/79 | HR 65 | Temp 97.9°F | Resp 18

## 2016-08-14 DIAGNOSIS — D500 Iron deficiency anemia secondary to blood loss (chronic): Secondary | ICD-10-CM

## 2016-08-14 MED ORDER — IRON SUCROSE 20 MG/ML IV SOLN
INTRAVENOUS | Status: AC
  Filled 2016-08-14: qty 10

## 2016-08-14 MED ORDER — SODIUM CHLORIDE 0.9 % IV SOLN: 200.0000 mg | Freq: Once | INTRAVENOUS | Status: DC

## 2016-08-14 MED ORDER — SODIUM CHLORIDE 0.9 % IV SOLN
Freq: Once | INTRAVENOUS | Status: AC
  Administered 2016-08-14: 20 mL/h via INTRAVENOUS
  Filled 2016-08-14: qty 1000

## 2016-08-14 MED ORDER — IRON SUCROSE 20 MG/ML IV SOLN
200.0000 mg | Freq: Once | INTRAVENOUS | Status: AC
Start: 2016-08-14 — End: 2016-08-14
  Administered 2016-08-14: 200 mg via INTRAVENOUS

## 2016-09-10 DIAGNOSIS — L821 Other seborrheic keratosis: Secondary | ICD-10-CM | POA: Insufficient documentation

## 2016-10-30 NOTE — Progress Notes (Signed)
11/01/2016 10:19 AM   Chaya Jan 28-Mar-1933 [id number]  Referring provider: Sherrin Daisy, MD No address on file  Chief Complaint  Patient presents with  . Benign Prostatic Hypertrophy    HPI: Patient is an 83 year old African American male who presents today for a 6 month follow up for BPH with LU TS.  BPH WITH LUTS His IPSS score today is 9,  which is moderate lower urinary tract symptomatology. He is mixed with his quality life due to his urinary symptoms.  His previous I PSS score was 19/4.  His previous PVR was 22 mL.    His major complaints today are urgency and nocturia.  He has had these symptoms for over one year.  He denies any dysuria, hematuria or suprapubic pain.   He currently taking tamsulosin 0.4 mg daily and finasteride 5 mg daily.   If he doesn't take the tamsulosin, he cannot urinate.    He also denies any recent fevers, chills, nausea or vomiting.  He does not have a family history of PCa.  He is drinking two 16 ounces of water daily.  He has eliminated sodas.  He is not decreasing fluid at night.  He has a diet heavy in salt.        IPSS    Row Name 11/01/16 1000         International Prostate Symptom Score   How often have you had the sensation of not emptying your bladder? Less than 1 in 5     How often have you had to urinate less than every two hours? Less than 1 in 5 times     How often have you found you stopped and started again several times when you urinated? Not at All     How often have you found it difficult to postpone urination? About half the time     How often have you had a weak urinary stream? Less than half the time     How often have you had to strain to start urination? Not at All     How many times did you typically get up at night to urinate? 2 Times     Total IPSS Score 9       Quality of Life due to urinary symptoms   If you were to spend the rest of your life with your urinary condition just the way it is now how  would you feel about that? Mixed        Score:  1-7 Mild 8-19 Moderate 20-35 Severe     PMH: Past Medical History:  Diagnosis Date  . Allergic rhinitis   . Anemia   . AVM (arteriovenous malformation) of colon   . BPH (benign prostatic hyperplasia)   . Cataract cortical, senile   . Colon polyps 02/2005, 2012   TUBULAR ADENOMA  . Diabetes mellitus type 2, uncomplicated ([place])   . Duodenal ulcer    w/ gastric ulcer  . Glaucoma   . H. pylori infection   . H. pylori infection   . History of blood product transfusion   . History of blood transfusion 02/2005   hgb was 6  . History of duodenal ulcer 02/2005   The lesion was 5 mm in largest dimension.  . Hyperlipidemia   . Hypertension   . Sleep apnea    BiPap    Surgical History: Past Surgical History:  Procedure Laterality Date  . COLONOSCOPY     07/2010,  09/09/2002, 10/15/2002, 02/16/2005, Adenomatous polyps, repeat 5 years, Dr Vira Agar  . COLONOSCOPY WITH PROPOFOL N/A 03/01/2015   Procedure: COLONOSCOPY WITH PROPOFOL;  Surgeon: Manya Silvas, MD;  Location: [facility] ENDOSCOPY;  Service: Endoscopy;  Laterality: N/A;  . ESOPHAGOGASTRODUODENOSCOPY     08/17/2010, 02/16/2005, no repeat per RTE  . ESOPHAGOGASTRODUODENOSCOPY (EGD) WITH PROPOFOL N/A 03/01/2015   Procedure: ESOPHAGOGASTRODUODENOSCOPY (EGD) WITH PROPOFOL;  Surgeon: Manya Silvas, MD;  Location: [facility] ENDOSCOPY;  Service: Endoscopy;  Laterality: N/A;  . ESOPHAGOGASTRODUODENOSCOPY (EGD) WITH PROPOFOL N/A 05/15/2016   Procedure: ESOPHAGOGASTRODUODENOSCOPY (EGD) WITH PROPOFOL;  Surgeon: Manya Silvas, MD;  Location: [facility] ENDOSCOPY;  Service: Endoscopy;  Laterality: N/A;    Home Medications:  Allergies as of 11/01/2016   No Known Allergies     Medication List       Accurate as of 11/01/16 10:19 AM. Always use your most recent med list.          amLODipine 10 MG tablet Commonly known as:  NORVASC Reported on 03/21/2015   ferrous sulfate 325 (65 FE) MG  tablet Take by mouth 2 (two) times daily with a meal.   finasteride 5 MG tablet Commonly known as:  PROSCAR Take 1 tablet (5 mg total) by mouth daily.   glimepiride 2 MG tablet Commonly known as:  AMARYL TAKE 1 TABLET DAILY WITH BREAKFAST   JANUVIA 100 MG tablet Generic drug:  sitaGLIPtin Take 100 mg by mouth daily.   metFORMIN 1000 MG tablet Commonly known as:  GLUCOPHAGE Take 1,000 mg by mouth 2 (two) times daily with a meal.   omeprazole 20 MG capsule Commonly known as:  PRILOSEC TAKE 1 CAPSULE DAILY (CALL FOR APPOINTMENT)   pioglitazone 15 MG tablet Commonly known as:  ACTOS TAKE 1 TABLET DAILY   RA VITAMIN B-12 TR 1000 MCG Tbcr Generic drug:  Cyanocobalamin Take by mouth.   ramipril 10 MG capsule Commonly known as:  ALTACE TAKE 1 CAPSULE DAILY   tamsulosin 0.4 MG Caps capsule Commonly known as:  FLOMAX Take 1 capsule (0.4 mg total) by mouth daily.   triamcinolone 0.025 % cream Commonly known as:  KENALOG Apply 1 application topically 2 (two) times daily as needed.       Allergies: No Known Allergies  Family History: Family History  Problem Relation Age of Onset  . Diabetes Mother   . Breast cancer Sister   . Prostate cancer Neg Hx   . Bladder Cancer Neg Hx   . Kidney cancer Neg Hx     Social History:  reports that he quit smoking about 23 years ago. His smoking use included Cigarettes. He has a 40.00 pack-year smoking history. He has never used smokeless tobacco. He reports that he does not drink alcohol or use drugs.  ROS: UROLOGY Frequent Urination?: No Hard to postpone urination?: Yes Burning/pain with urination?: No Get up at night to urinate?: Yes Leakage of urine?: No Urine stream starts and stops?: No Trouble starting stream?: No Do you have to strain to urinate?: No Blood in urine?: No Urinary tract infection?: No Sexually transmitted disease?: No Injury to kidneys or bladder?: No Painful intercourse?: No Weak stream?:  No Erection problems?: No Penile pain?: No  Gastrointestinal Nausea?: No Vomiting?: No Indigestion/heartburn?: No Diarrhea?: No Constipation?: No  Constitutional Fever: No Night sweats?: No Weight loss?: No Fatigue?: No  Skin Skin rash/lesions?: No Itching?: No  Eyes Blurred vision?: No Double vision?: No  Ears/Nose/Throat Sore throat?: No Sinus problems?: No  Hematologic/Lymphatic Swollen glands?:  No Easy bruising?: No  Cardiovascular Leg swelling?: Yes Chest pain?: No  Respiratory Cough?: No Shortness of breath?: No  Endocrine Excessive thirst?: No  Musculoskeletal Back pain?: No Joint pain?: Yes  Neurological Headaches?: No Dizziness?: No  Psychologic Depression?: No Anxiety?: No  Physical Exam: BP (!) 163/75   Pulse 85   Ht 5\' 6"  (1.676 m)   Wt 182 lb (82.6 kg)   BMI 29.38 kg/m   Constitutional: Well nourished. Alert and oriented, No acute distress. HEENT: [place] AT, moist mucus membranes. Trachea midline, no masses. Cardiovascular: No clubbing, cyanosis, or edema. Respiratory: Normal respiratory effort, no increased work of breathing. GI: Abdomen is soft, non tender, non distended, no abdominal masses. Liver and spleen not palpable.  No hernias appreciated.  Stool sample for occult testing is not indicated.   GU: No CVA tenderness.  No bladder fullness or masses.  Patient with circumcised phallus.  Urethral meatus is patent.  No penile discharge. No penile lesions or rashes. Scrotum without lesions, cysts, rashes and/or edema.  Testicles are located scrotally bilaterally. No masses are appreciated in the testicles. Left and right epididymis are normal. Rectal: Patient with  normal sphincter tone. Anus and perineum without scarring or rashes. No rectal masses are appreciated. Prostate is approximately 50 grams, no nodules are appreciated. Seminal vesicles are normal. Skin: No rashes, bruises or suspicious lesions. Lymph: No cervical or inguinal  adenopathy. Neurologic: Grossly intact, no focal deficits, moving all 4 extremities. Psychiatric: Normal mood and affect.  Laboratory Data: Lab Results  Component Value Date   WBC 6.7 07/16/2016   HGB 10.6 (L) 07/16/2016   HCT 32.5 (L) 07/16/2016   MCV 85.6 07/16/2016   PLT 279 07/16/2016   I have reviewed the labs.  Assessment & Plan:    1. BPH with LUTS  - IPSS score is 9/3, it is improving  - Continue conservative management, avoiding bladder irritants and timed voiding's  - Continue tamsulosin 0.4 mg daily and finasteride 5 mg daily; refill  - RTC in 6 months for I PSS and PVR   2. Nocturia  - patient states it has improved greatly  Return in about 1 year (around 11/01/2017) for IPSS and PVR.  These notes generated with voice recognition software. I apologize for typographical errors.  Zara Council, [facility] [address] [place], [place] [address] [phone]

## 2016-11-01 ENCOUNTER — Ambulatory Visit (INDEPENDENT_AMBULATORY_CARE_PROVIDER_SITE_OTHER): Payer: Medicare Other | Admitting: Urology

## 2016-11-01 ENCOUNTER — Encounter: Payer: Self-pay | Admitting: Urology

## 2016-11-01 VITALS — BP 163/75 | HR 85 | Ht 66.0 in | Wt 182.0 lb

## 2016-11-01 DIAGNOSIS — R351 Nocturia: Secondary | ICD-10-CM | POA: Diagnosis not present

## 2016-11-01 DIAGNOSIS — N138 Other obstructive and reflux uropathy: Secondary | ICD-10-CM | POA: Diagnosis not present

## 2016-11-01 DIAGNOSIS — N401 Enlarged prostate with lower urinary tract symptoms: Secondary | ICD-10-CM

## 2016-11-01 DIAGNOSIS — R3915 Urgency of urination: Secondary | ICD-10-CM | POA: Diagnosis not present

## 2016-11-01 MED ORDER — TAMSULOSIN HCL 0.4 MG PO CAPS: 0.4000 mg | ORAL_CAPSULE | Freq: Every day | ORAL | 3 refills | Status: DC

## 2016-11-01 MED ORDER — FINASTERIDE 5 MG PO TABS: 5.0000 mg | ORAL_TABLET | Freq: Every day | ORAL | 3 refills | Status: DC

## 2016-12-06 ENCOUNTER — Other Ambulatory Visit: Payer: Self-pay

## 2016-12-06 MED ORDER — FINASTERIDE 5 MG PO TABS: 5.0000 mg | ORAL_TABLET | Freq: Every day | ORAL | 3 refills | Status: DC

## 2017-01-16 ENCOUNTER — Inpatient Hospital Stay: Payer: Medicare Other | Attending: Internal Medicine

## 2017-01-16 DIAGNOSIS — D500 Iron deficiency anemia secondary to blood loss (chronic): Secondary | ICD-10-CM

## 2017-01-16 DIAGNOSIS — D509 Iron deficiency anemia, unspecified: Secondary | ICD-10-CM | POA: Diagnosis not present

## 2017-01-16 LAB — CBC WITH DIFFERENTIAL/PLATELET
BASOS PCT: 1 %
Basophils Absolute: 0 10*3/uL (ref 0–0.1)
Eosinophils Absolute: 0.1 10*3/uL (ref 0–0.7)
Eosinophils Relative: 3 %
HEMATOCRIT: 31 % — AB (ref 40.0–52.0)
HEMOGLOBIN: 10.1 g/dL — AB (ref 13.0–18.0)
LYMPHS ABS: 1.1 10*3/uL (ref 1.0–3.6)
Lymphocytes Relative: 21 %
MCH: 25.8 pg — AB (ref 26.0–34.0)
MCHC: 32.7 g/dL (ref 32.0–36.0)
MCV: 79 fL — AB (ref 80.0–100.0)
MONO ABS: 0.5 10*3/uL (ref 0.2–1.0)
MONOS PCT: 10 %
NEUTROS ABS: 3.5 10*3/uL (ref 1.4–6.5)
NEUTROS PCT: 65 %
Platelets: 309 10*3/uL (ref 150–440)
RBC: 3.93 MIL/uL — ABNORMAL LOW (ref 4.40–5.90)
RDW: 18.2 % — ABNORMAL HIGH (ref 11.5–14.5)
WBC: 5.4 10*3/uL (ref 3.8–10.6)

## 2017-01-16 LAB — IRON AND TIBC
IRON: 38 ug/dL — AB (ref 45–182)
Saturation Ratios: 11 % — ABNORMAL LOW (ref 17.9–39.5)
TIBC: 356 ug/dL (ref 250–450)
UIBC: 318 ug/dL

## 2017-01-16 LAB — FERRITIN: Ferritin: 9 ng/mL — ABNORMAL LOW (ref 24–336)

## 2017-01-21 ENCOUNTER — Inpatient Hospital Stay (HOSPITAL_BASED_OUTPATIENT_CLINIC_OR_DEPARTMENT_OTHER): Payer: Medicare Other | Admitting: Internal Medicine

## 2017-01-21 ENCOUNTER — Encounter: Payer: Self-pay | Admitting: Internal Medicine

## 2017-01-21 ENCOUNTER — Inpatient Hospital Stay: Payer: Medicare Other

## 2017-01-21 VITALS — BP 180/89 | HR 76 | Temp 97.5°F | Resp 16 | Wt 183.3 lb

## 2017-01-21 DIAGNOSIS — D500 Iron deficiency anemia secondary to blood loss (chronic): Secondary | ICD-10-CM

## 2017-01-21 DIAGNOSIS — D509 Iron deficiency anemia, unspecified: Secondary | ICD-10-CM | POA: Diagnosis not present

## 2017-01-21 MED ORDER — IRON SUCROSE 20 MG/ML IV SOLN
200.0000 mg | Freq: Once | INTRAVENOUS | Status: AC
  Administered 2017-01-21: 200 mg via INTRAVENOUS
  Filled 2017-01-21: qty 10

## 2017-01-21 MED ORDER — IRON SUCROSE 20 MG/ML IV SOLN
INTRAVENOUS | Status: AC
  Filled 2017-01-21: qty 10

## 2017-01-21 MED ORDER — SODIUM CHLORIDE 0.9 % IV SOLN
Freq: Once | INTRAVENOUS | Status: AC
  Administered 2017-01-21: 11:00:00 via INTRAVENOUS
  Filled 2017-01-21: qty 1000

## 2017-01-21 NOTE — Patient Instructions (Signed)
Iron Sucrose injection What is this medicine? IRON SUCROSE (AHY ern SOO krohs) is an iron complex. Iron is used to make healthy red blood cells, which carry oxygen and nutrients throughout the body. This medicine is used to treat iron deficiency anemia in people with chronic kidney disease. This medicine may be used for other purposes; ask your health care provider or pharmacist if you have questions. COMMON BRAND NAME(S): Venofer What should I tell my health care provider before I take this medicine? They need to know if you have any of these conditions: -anemia not caused by low iron levels -heart disease -high levels of iron in the blood -kidney disease -liver disease -an unusual or allergic reaction to iron, other medicines, foods, dyes, or preservatives -pregnant or trying to get pregnant -breast-feeding How should I use this medicine? This medicine is for infusion into a vein. It is given by a health care professional in a hospital or clinic setting. Talk to your pediatrician regarding the use of this medicine in children. While this drug may be prescribed for children as young as 2 years for selected conditions, precautions do apply. Overdosage: If you think you have taken too much of this medicine contact a poison control center or emergency room at once. NOTE: This medicine is only for you. Do not share this medicine with others. What if I miss a dose? It is important not to miss your dose. Call your doctor or health care professional if you are unable to keep an appointment. What may interact with this medicine? Do not take this medicine with any of the following medications: -deferoxamine -dimercaprol -other iron products This medicine may also interact with the following medications: -chloramphenicol -deferasirox This list may not describe all possible interactions. Give your health care provider a list of all the medicines, herbs, non-prescription drugs, or dietary  supplements you use. Also tell them if you smoke, drink alcohol, or use illegal drugs. Some items may interact with your medicine. What should I watch for while using this medicine? Visit your doctor or healthcare professional regularly. Tell your doctor or healthcare professional if your symptoms do not start to get better or if they get worse. You may need blood work done while you are taking this medicine. You may need to follow a special diet. Talk to your doctor. Foods that contain iron include: whole grains/cereals, dried fruits, beans, or peas, leafy green vegetables, and organ meats (liver, kidney). What side effects may I notice from receiving this medicine? Side effects that you should report to your doctor or health care professional as soon as possible: -allergic reactions like skin rash, itching or hives, swelling of the face, lips, or tongue -breathing problems -changes in blood pressure -cough -fast, irregular heartbeat -feeling faint or lightheaded, falls -fever or chills -flushing, sweating, or hot feelings -joint or muscle aches/pains -seizures -swelling of the ankles or feet -unusually weak or tired Side effects that usually do not require medical attention (report to your doctor or health care professional if they continue or are bothersome): -diarrhea -feeling achy -headache -irritation at site where injected -nausea, vomiting -stomach upset -tiredness This list may not describe all possible side effects. Call your doctor for medical advice about side effects. You may report side effects to FDA at 1-800-FDA-1088. Where should I keep my medicine? This drug is given in a hospital or clinic and will not be stored at home. NOTE: This sheet is a summary. It may not cover all possible information. If   you have questions about this medicine, talk to your doctor, pharmacist, or health care provider.  2018 Elsevier/Gold Standard (2010-10-04 17:14:35)  

## 2017-01-21 NOTE — Assessment & Plan Note (Addendum)
#   Severe Iron deficiency Anemia- likely from chronic GI blood loss. 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  On PO iron. Hb- today 10.6; sat 15%.   # Patient is asymptomatic; however given iron deficiency noted on studies-Proceed with IV Venofer today and weekly for 4 doses.   # Follow-up with [place] CBC and iron studies/ done a few days prior to the visit- in 6 months.

## 2017-01-21 NOTE — Progress Notes (Signed)
Tommy Heath OFFICE PROGRESS NOTE  Patient Care Team: Tommy Daisy, Heath as PCP - General (Family Medicine)   SUMMARY OF ONCOLOGIC HISTORY:  # 2009- Tommy DEFICIENCY ANEMIA-likely secondary to upper GI bleed; EGD [gastric ulcer & duodenal AV malformations] & Colonoscopy 2007& 2012 [Tommy Heath];   # JAN 2017- Hb-8.8/Ferritin-7; IV Venofer 300mg  x4 x ;FEB 2017 [Tommy Heath non-bleeding angioectasias in the duodenum & Colon s/p argon plasma coagulation. PO Tommy BID  INTERVAL HISTORY:  84 -year-old male patient with with above history of Tommy deficiency anemia likely from chronic GI blood loss/  Secondary to above GI blood loss is here for follow-up.  Patient denies any significant fatigue or shortness of breath.  Patient continues to be on p.o. Tommy.  Otherwise denies any blood in stools.  REVIEW OF SYSTEMS:  A complete 10 point review of system is done which is negative except mentioned above/history of present illness.   PAST MEDICAL HISTORY :  Past Medical History:  Diagnosis Date  . Allergic rhinitis   . Anemia   . AVM (arteriovenous malformation) of colon   . BPH (benign prostatic hyperplasia)   . Cataract cortical, senile   . Colon polyps 02/2005, 2012   TUBULAR ADENOMA  . Diabetes mellitus type 2, uncomplicated ([place])   . Duodenal ulcer    w/ gastric ulcer  . Glaucoma   . H. pylori infection   . H. pylori infection   . History of blood product transfusion   . History of blood transfusion 02/2005   hgb was 6  . History of duodenal ulcer 02/2005   The lesion was 5 mm in largest dimension.  . Hyperlipidemia   . Hypertension   . Sleep apnea    BiPap    PAST SURGICAL HISTORY :   Past Surgical History:  Procedure Laterality Date  . COLONOSCOPY     07/2010, 09/09/2002, 10/15/2002, 02/16/2005, Adenomatous polyps, repeat 5 years, Tommy Heath  . COLONOSCOPY WITH PROPOFOL N/A 03/01/2015   Procedure: COLONOSCOPY WITH PROPOFOL;  Surgeon: Tommy Silvas, Heath;  Location:  [facility] ENDOSCOPY;  Service: Endoscopy;  Laterality: N/A;  . ESOPHAGOGASTRODUODENOSCOPY     08/17/2010, 02/16/2005, no repeat per RTE  . ESOPHAGOGASTRODUODENOSCOPY (EGD) WITH PROPOFOL N/A 03/01/2015   Procedure: ESOPHAGOGASTRODUODENOSCOPY (EGD) WITH PROPOFOL;  Surgeon: Tommy Silvas, Heath;  Location: [facility] ENDOSCOPY;  Service: Endoscopy;  Laterality: N/A;  . ESOPHAGOGASTRODUODENOSCOPY (EGD) WITH PROPOFOL N/A 05/15/2016   Procedure: ESOPHAGOGASTRODUODENOSCOPY (EGD) WITH PROPOFOL;  Surgeon: Tommy Silvas, Heath;  Location: [facility] ENDOSCOPY;  Service: Endoscopy;  Laterality: N/A;    FAMILY HISTORY :   Family History  Problem Relation Age of Onset  . Diabetes Mother   . Breast cancer Sister   . Prostate cancer Neg Hx   . Bladder Cancer Neg Hx   . Kidney cancer Neg Hx     SOCIAL HISTORY:   Social History   Tobacco Use  . Smoking status: Former Smoker    Packs/day: 1.00    Years: 40.00    Pack years: 40.00    Types: Cigarettes    Last attempt to quit: 01/07/1993    Years since quitting: 24.0  . Smokeless tobacco: Never Used  Substance Use Topics  . Alcohol use: No    Alcohol/week: 0.0 oz  . Drug use: No    ALLERGIES:  has No Known Allergies.  MEDICATIONS:  Current Outpatient Medications  Medication Sig Dispense Refill  . amLODipine (NORVASC) 10 MG tablet Reported on 03/21/2015    .  Cyanocobalamin (RA VITAMIN B-12 TR) 1000 MCG TBCR Take by mouth.    . ferrous sulfate 325 (65 FE) MG tablet Take by mouth 2 (two) times daily with a meal.     . finasteride (PROSCAR) 5 MG tablet Take 1 tablet (5 mg total) by mouth daily. 90 tablet 3  . glimepiride (AMARYL) 2 MG tablet TAKE 1 TABLET DAILY WITH BREAKFAST    . metFORMIN (GLUCOPHAGE) 1000 MG tablet Take 1,000 mg by mouth 2 (two) times daily with a meal.     . omeprazole (PRILOSEC) 20 MG capsule TAKE 1 CAPSULE DAILY (CALL FOR APPOINTMENT)    . pioglitazone (ACTOS) 15 MG tablet TAKE 1 TABLET DAILY    . ramipril (ALTACE) 10 MG capsule TAKE 1  CAPSULE DAILY    . sitaGLIPtin (JANUVIA) 100 MG tablet Take 100 mg by mouth daily.     . tamsulosin (FLOMAX) 0.4 MG CAPS capsule Take 1 capsule (0.4 mg total) by mouth daily. 90 capsule 3  . triamcinolone (KENALOG) 0.025 % cream Apply 1 application topically 2 (two) times daily as needed.      No current facility-administered medications for this visit.    Facility-Administered Medications Ordered in Other Visits  Medication Dose Route Frequency Provider Last Rate Last Dose  . Tommy Heath        PHYSICAL EXAMINATION:   BP (!) 180/89 (BP Location: Left Arm, Patient Position: Sitting)   Pulse 76   Temp (!) 97.5 F (36.4 C) (Tympanic)   Resp 16   Wt 183 lb 5 oz (83.2 kg)   BMI 29.59 kg/Tommy   Filed Weights   01/21/17 1006  Weight: 183 lb 5 oz (83.2 kg)     GENERAL: Well-nourished well-developed; Alert, no distress and comfortable. Alone.  EYES: No pallor no icterus. OROPHARYNX: no thrush or ulceration; good dentition  NECK: supple, no masses felt LYMPH: no palpable lymphadenopathy in the cervical, axillary or inguinal regions LUNGS: clear to auscultation and No wheeze or crackles HEART/CVS: regular rate & rhythm and no murmurs; No lower extremity edema ABDOMEN: abdomen soft, non-tender and normal bowel sounds Musculoskeletal:no cyanosis of digits and no clubbing  PSYCH: alert & oriented x 3 with fluent speech NEURO: no focal motor/sensory deficits SKIN: no rashes or significant lesions  LABORATORY DATA:  I have reviewed the data as listed    Component Value Date/Time   NA 138 01/17/2015 1439   K 3.7 01/17/2015 1439   CL 105 01/17/2015 1439   CO2 27 01/17/2015 1439   GLUCOSE 133 (H) 01/17/2015 1439   BUN 17 01/17/2015 1439   CREATININE 1.19 01/17/2015 1439   CALCIUM 9.5 01/17/2015 1439   PROT 7.8 01/17/2015 1439   ALBUMIN 3.9 01/17/2015 1439   AST 21 01/17/2015 1439   ALT 12 (L)  01/17/2015 1439   ALKPHOS 60 01/17/2015 1439   BILITOT 0.5 01/17/2015 1439   GFRNONAA 56 (L) 01/17/2015 1439   GFRAA >60 01/17/2015 1439    No results found for: SPEP, UPEP  Lab Results  Component Value Date   WBC 5.4 01/16/2017   NEUTROABS 3.5 01/16/2017   HGB 10.1 (L) 01/16/2017   HCT 31.0 (L) 01/16/2017   MCV 79.0 (L) 01/16/2017   PLT 309 01/16/2017      Chemistry      Component Value Date/Time   NA 138 01/17/2015 1439   K 3.7 01/17/2015 1439   CL 105 01/17/2015  1439   CO2 27 01/17/2015 1439   BUN 17 01/17/2015 1439   CREATININE 1.19 01/17/2015 1439      Component Value Date/Time   CALCIUM 9.5 01/17/2015 1439   ALKPHOS 60 01/17/2015 1439   AST 21 01/17/2015 1439   ALT 12 (L) 01/17/2015 1439   BILITOT 0.5 01/17/2015 1439       ASSESSMENT & PLAN:  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  On PO Tommy. Hb- today 10.6; sat 15%.   # Patient is asymptomatic; however given Tommy deficiency noted on studies-Proceed with IV Venofer today and weekly for 4 doses.   # Follow-up with [place] CBC and Tommy studies/ done a few days prior to the visit- in 6 months.     Tommy Sickle, Heath 01/28/2017 1:53 PM

## 2017-01-30 ENCOUNTER — Inpatient Hospital Stay: Payer: Medicare Other

## 2017-01-30 DIAGNOSIS — D509 Iron deficiency anemia, unspecified: Secondary | ICD-10-CM | POA: Diagnosis not present

## 2017-01-30 DIAGNOSIS — D500 Iron deficiency anemia secondary to blood loss (chronic): Secondary | ICD-10-CM

## 2017-01-30 MED ORDER — SODIUM CHLORIDE 0.9 % IV SOLN
Freq: Once | INTRAVENOUS | Status: AC
Start: 2017-01-30 — End: 2017-01-30
  Administered 2017-01-30: 10:00:00 via INTRAVENOUS
  Filled 2017-01-30: qty 1000

## 2017-01-30 MED ORDER — IRON SUCROSE 20 MG/ML IV SOLN
200.0000 mg | Freq: Once | INTRAVENOUS | Status: AC
  Administered 2017-01-30: 200 mg via INTRAVENOUS
  Filled 2017-01-30: qty 10

## 2017-01-30 NOTE — Patient Instructions (Signed)

## 2017-02-06 ENCOUNTER — Inpatient Hospital Stay: Payer: Medicare Other

## 2017-02-06 VITALS — BP 154/72 | HR 71 | Temp 97.6°F | Resp 20

## 2017-02-06 DIAGNOSIS — D500 Iron deficiency anemia secondary to blood loss (chronic): Secondary | ICD-10-CM

## 2017-02-06 DIAGNOSIS — D509 Iron deficiency anemia, unspecified: Secondary | ICD-10-CM | POA: Diagnosis not present

## 2017-02-06 MED ORDER — IRON SUCROSE 20 MG/ML IV SOLN
200.0000 mg | Freq: Once | INTRAVENOUS | Status: AC
  Administered 2017-02-06: 200 mg via INTRAVENOUS
  Filled 2017-02-06: qty 10

## 2017-02-06 MED ORDER — SODIUM CHLORIDE 0.9 % IV SOLN
Freq: Once | INTRAVENOUS | Status: AC
  Administered 2017-02-06: 10:00:00 via INTRAVENOUS
  Filled 2017-02-06: qty 1000

## 2017-02-06 NOTE — Patient Instructions (Signed)

## 2017-02-13 ENCOUNTER — Inpatient Hospital Stay: Payer: Medicare Other | Attending: Internal Medicine

## 2017-02-13 VITALS — BP 166/74 | HR 75 | Temp 96.8°F | Resp 20

## 2017-02-13 DIAGNOSIS — D500 Iron deficiency anemia secondary to blood loss (chronic): Secondary | ICD-10-CM

## 2017-02-13 DIAGNOSIS — D509 Iron deficiency anemia, unspecified: Secondary | ICD-10-CM | POA: Insufficient documentation

## 2017-02-13 MED ORDER — SODIUM CHLORIDE 0.9 % IV SOLN
Freq: Once | INTRAVENOUS | Status: AC
  Administered 2017-02-13: 10:00:00 via INTRAVENOUS
  Filled 2017-02-13: qty 1000

## 2017-02-13 MED ORDER — IRON SUCROSE 20 MG/ML IV SOLN
200.0000 mg | Freq: Once | INTRAVENOUS | Status: AC
  Administered 2017-02-13: 200 mg via INTRAVENOUS
  Filled 2017-02-13: qty 10

## 2017-02-13 NOTE — Patient Instructions (Signed)
Iron Sucrose injection What is this medicine? IRON SUCROSE (AHY ern SOO krohs) is an iron complex. Iron is used to make healthy red blood cells, which carry oxygen and nutrients throughout the body. This medicine is used to treat iron deficiency anemia in people with chronic kidney disease. This medicine may be used for other purposes; ask your health care provider or pharmacist if you have questions. COMMON BRAND NAME(S): Venofer What should I tell my health care provider before I take this medicine? They need to know if you have any of these conditions: -anemia not caused by low iron levels -heart disease -high levels of iron in the blood -kidney disease -liver disease -an unusual or allergic reaction to iron, other medicines, foods, dyes, or preservatives -pregnant or trying to get pregnant -breast-feeding How should I use this medicine? This medicine is for infusion into a vein. It is given by a health care professional in a hospital or clinic setting. Talk to your pediatrician regarding the use of this medicine in children. While this drug may be prescribed for children as young as 2 years for selected conditions, precautions do apply. Overdosage: If you think you have taken too much of this medicine contact a poison control center or emergency room at once. NOTE: This medicine is only for you. Do not share this medicine with others. What if I miss a dose? It is important not to miss your dose. Call your doctor or health care professional if you are unable to keep an appointment. What may interact with this medicine? Do not take this medicine with any of the following medications: -deferoxamine -dimercaprol -other iron products This medicine may also interact with the following medications: -chloramphenicol -deferasirox This list may not describe all possible interactions. Give your health care provider a list of all the medicines, herbs, non-prescription drugs, or dietary  supplements you use. Also tell them if you smoke, drink alcohol, or use illegal drugs. Some items may interact with your medicine. What should I watch for while using this medicine? Visit your doctor or healthcare professional regularly. Tell your doctor or healthcare professional if your symptoms do not start to get better or if they get worse. You may need blood work done while you are taking this medicine. You may need to follow a special diet. Talk to your doctor. Foods that contain iron include: whole grains/cereals, dried fruits, beans, or peas, leafy green vegetables, and organ meats (liver, kidney). What side effects may I notice from receiving this medicine? Side effects that you should report to your doctor or health care professional as soon as possible: -allergic reactions like skin rash, itching or hives, swelling of the face, lips, or tongue -breathing problems -changes in blood pressure -cough -fast, irregular heartbeat -feeling faint or lightheaded, falls -fever or chills -flushing, sweating, or hot feelings -joint or muscle aches/pains -seizures -swelling of the ankles or feet -unusually weak or tired Side effects that usually do not require medical attention (report to your doctor or health care professional if they continue or are bothersome): -diarrhea -feeling achy -headache -irritation at site where injected -nausea, vomiting -stomach upset -tiredness This list may not describe all possible side effects. Call your doctor for medical advice about side effects. You may report side effects to FDA at 1-800-FDA-1088. Where should I keep my medicine? This drug is given in a hospital or clinic and will not be stored at home. NOTE: This sheet is a summary. It may not cover all possible information. If   you have questions about this medicine, talk to your doctor, pharmacist, or health care provider.  2018 Elsevier/Gold Standard (2010-10-04 17:14:35)  

## 2017-02-19 ENCOUNTER — Inpatient Hospital Stay: Payer: Medicare Other

## 2017-02-19 ENCOUNTER — Other Ambulatory Visit: Payer: Self-pay | Admitting: Internal Medicine

## 2017-02-19 VITALS — BP 176/65 | HR 78 | Temp 96.0°F | Resp 20

## 2017-02-19 DIAGNOSIS — D500 Iron deficiency anemia secondary to blood loss (chronic): Secondary | ICD-10-CM

## 2017-02-19 DIAGNOSIS — D509 Iron deficiency anemia, unspecified: Secondary | ICD-10-CM | POA: Diagnosis not present

## 2017-02-19 MED ORDER — SODIUM CHLORIDE 0.9 % IV SOLN
Freq: Once | INTRAVENOUS | Status: AC
  Administered 2017-02-19: 10:00:00 via INTRAVENOUS
  Filled 2017-02-19: qty 1000

## 2017-02-19 MED ORDER — IRON SUCROSE 20 MG/ML IV SOLN
200.0000 mg | Freq: Once | INTRAVENOUS | Status: AC
  Administered 2017-02-19: 200 mg via INTRAVENOUS
  Filled 2017-02-19: qty 10

## 2017-02-19 MED ORDER — IRON SUCROSE 20 MG/ML IV SOLN
INTRAVENOUS | Status: AC
  Filled 2017-02-19: qty 10

## 2017-02-19 NOTE — Patient Instructions (Signed)

## 2017-06-10 ENCOUNTER — Telehealth: Payer: Self-pay | Admitting: Urology

## 2017-06-10 DIAGNOSIS — R3915 Urgency of urination: Secondary | ICD-10-CM

## 2017-06-10 MED ORDER — TAMSULOSIN HCL 0.4 MG PO CAPS: 0.4000 mg | ORAL_CAPSULE | Freq: Every day | ORAL | 3 refills | Status: DC

## 2017-06-10 MED ORDER — TAMSULOSIN HCL 0.4 MG PO CAPS: 0.4000 mg | ORAL_CAPSULE | Freq: Every day | ORAL | 0 refills | Status: DC

## 2017-06-10 NOTE — Telephone Encounter (Signed)
Medication sent to both pharmacies. Patient notified.

## 2017-06-10 NOTE — Telephone Encounter (Signed)
Pt requesting to have medication called into a local pharmacy [facility] in [place] until [facility] has been sent in and he gets his medication. Pt also asks if [facility] has called his Rx in?  Tamsulosin 0.4mg  Please advise pt @ [phone]

## 2017-06-10 NOTE — Addendum Note (Signed)
Addended by: Kyra Manges on: 06/10/2017 03:52 PM   Modules accepted: Orders

## 2017-07-17 ENCOUNTER — Inpatient Hospital Stay: Payer: Medicare Other | Attending: Internal Medicine

## 2017-07-17 DIAGNOSIS — Q2733 Arteriovenous malformation of digestive system vessel: Secondary | ICD-10-CM | POA: Diagnosis not present

## 2017-07-17 DIAGNOSIS — D500 Iron deficiency anemia secondary to blood loss (chronic): Secondary | ICD-10-CM | POA: Insufficient documentation

## 2017-07-17 LAB — COMPREHENSIVE METABOLIC PANEL
ALBUMIN: 3.7 g/dL (ref 3.5–5.0)
ALT: 12 U/L (ref 0–44)
ANION GAP: 11 (ref 5–15)
AST: 18 U/L (ref 15–41)
Alkaline Phosphatase: 63 U/L (ref 38–126)
BUN: 23 mg/dL (ref 8–23)
CHLORIDE: 106 mmol/L (ref 98–111)
CO2: 23 mmol/L (ref 22–32)
Calcium: 8.8 mg/dL — ABNORMAL LOW (ref 8.9–10.3)
Creatinine, Ser: 1.52 mg/dL — ABNORMAL HIGH (ref 0.61–1.24)
GFR calc Af Amer: 48 mL/min — ABNORMAL LOW (ref >60)
GFR, EST NON AFRICAN AMERICAN: 41 mL/min — AB (ref >60)
GLUCOSE: 197 mg/dL — AB (ref 70–99)
POTASSIUM: 3.8 mmol/L (ref 3.5–5.1)
Sodium: 140 mmol/L (ref 135–145)
Total Bilirubin: 0.6 mg/dL (ref 0.3–1.2)
Total Protein: 7.4 g/dL (ref 6.5–8.1)

## 2017-07-17 LAB — CBC WITH DIFFERENTIAL/PLATELET
BASOS ABS: 0.1 10*3/uL (ref 0–0.1)
Basophils Relative: 1 %
EOS ABS: 0.1 10*3/uL (ref 0–0.7)
Eosinophils Relative: 2 %
HCT: 32.3 % — ABNORMAL LOW (ref 40.0–52.0)
HEMOGLOBIN: 10.4 g/dL — AB (ref 13.0–18.0)
LYMPHS ABS: 1.1 10*3/uL (ref 1.0–3.6)
LYMPHS PCT: 17 %
MCH: 27.9 pg (ref 26.0–34.0)
MCHC: 32.2 g/dL (ref 32.0–36.0)
MCV: 86.5 fL (ref 80.0–100.0)
Monocytes Absolute: 0.6 10*3/uL (ref 0.2–1.0)
Monocytes Relative: 9 %
Neutro Abs: 4.8 10*3/uL (ref 1.4–6.5)
Neutrophils Relative %: 71 %
Platelets: 285 10*3/uL (ref 150–440)
RBC: 3.74 MIL/uL — AB (ref 4.40–5.90)
RDW: 14.7 % — ABNORMAL HIGH (ref 11.5–14.5)
WBC: 6.7 10*3/uL (ref 3.8–10.6)

## 2017-07-17 LAB — IRON AND TIBC
Iron: 54 ug/dL (ref 45–182)
Saturation Ratios: 16 % — ABNORMAL LOW (ref 17.9–39.5)
TIBC: 339 ug/dL (ref 250–450)
UIBC: 285 ug/dL

## 2017-07-17 LAB — FERRITIN: FERRITIN: 19 ng/mL — AB (ref 24–336)

## 2017-07-22 ENCOUNTER — Inpatient Hospital Stay (HOSPITAL_BASED_OUTPATIENT_CLINIC_OR_DEPARTMENT_OTHER): Payer: Medicare Other | Admitting: Internal Medicine

## 2017-07-22 ENCOUNTER — Other Ambulatory Visit: Payer: Self-pay

## 2017-07-22 ENCOUNTER — Encounter: Payer: Self-pay | Admitting: Internal Medicine

## 2017-07-22 ENCOUNTER — Inpatient Hospital Stay: Payer: Medicare Other

## 2017-07-22 VITALS — BP 146/69 | HR 71 | Temp 97.6°F | Resp 18

## 2017-07-22 VITALS — BP 145/64 | HR 76 | Temp 97.9°F | Resp 20 | Ht 66.0 in | Wt 179.9 lb

## 2017-07-22 DIAGNOSIS — E1122 Type 2 diabetes mellitus with diabetic chronic kidney disease: Secondary | ICD-10-CM

## 2017-07-22 DIAGNOSIS — D500 Iron deficiency anemia secondary to blood loss (chronic): Secondary | ICD-10-CM | POA: Diagnosis not present

## 2017-07-22 DIAGNOSIS — Z87891 Personal history of nicotine dependence: Secondary | ICD-10-CM

## 2017-07-22 DIAGNOSIS — I129 Hypertensive chronic kidney disease with stage 1 through stage 4 chronic kidney disease, or unspecified chronic kidney disease: Secondary | ICD-10-CM

## 2017-07-22 DIAGNOSIS — Q2733 Arteriovenous malformation of digestive system vessel: Secondary | ICD-10-CM | POA: Diagnosis not present

## 2017-07-22 DIAGNOSIS — N183 Chronic kidney disease, stage 3 (moderate): Secondary | ICD-10-CM

## 2017-07-22 MED ORDER — SODIUM CHLORIDE 0.9 % IV SOLN
Freq: Once | INTRAVENOUS | Status: AC
  Administered 2017-07-22: 14:00:00 via INTRAVENOUS
  Filled 2017-07-22: qty 1000

## 2017-07-22 MED ORDER — IRON SUCROSE 20 MG/ML IV SOLN
200.0000 mg | Freq: Once | INTRAVENOUS | Status: AC
  Administered 2017-07-22: 200 mg via INTRAVENOUS
  Filled 2017-07-22: qty 10

## 2017-07-22 NOTE — Progress Notes (Signed)
[place] OFFICE PROGRESS NOTE  Heath Care Team: Sherrin Daisy, MD as PCP - General (Family Medicine)   SUMMARY OF ONCOLOGIC HISTORY:  # 2009- IRON DEFICIENCY ANEMIA-likely secondary to upper GI bleed; EGD [gastric ulcer & duodenal AV malformations] & Colonoscopy 2007& 2012 [Dr.Elliot];   # JAN 2017- Hb-8.8/Ferritin-7; IV Venofer 300mg  x4 x ;FEB 2017 [Dr.Elliot non-bleeding angioectasias in the duodenum & Colon s/p argon plasma coagulation. PO Iron BID  # CKD- stage III [creatinine ~1.5]  INTERVAL HISTORY:  Tommy Heath with with above history of iron deficiency anemia likely from chronic GI blood loss/  Secondary to above GI blood loss is here for follow-up.  Heath complains of mild to moderate fatigue.  However denies any blood in stools.  He continues to be on p.o. Iron.  Denies any nausea vomiting.  REVIEW OF SYSTEMS:  A complete 10 point review of system is done which is negative except mentioned above/history of present illness.   PAST MEDICAL HISTORY :  Past Medical History:  Diagnosis Date  . Allergic rhinitis   . Anemia   . AVM (arteriovenous malformation) of colon   . BPH (benign prostatic hyperplasia)   . Cataract cortical, senile   . Colon polyps 02/2005, 2012   TUBULAR ADENOMA  . Diabetes mellitus type 2, uncomplicated ([place])   . Duodenal ulcer    w/ gastric ulcer  . Glaucoma   . H. pylori infection   . H. pylori infection   . History of blood product transfusion   . History of blood transfusion 02/2005   hgb was 6  . History of duodenal ulcer 02/2005   The lesion was 5 mm in largest dimension.  . Hyperlipidemia   . Hypertension   . Sleep apnea    BiPap    PAST SURGICAL HISTORY :   Past Surgical History:  Procedure Laterality Date  . COLONOSCOPY     07/2010, 09/09/2002, 10/15/2002, 02/16/2005, Adenomatous polyps, repeat 5 years, Dr Vira Agar  . COLONOSCOPY WITH PROPOFOL N/A 03/01/2015   Procedure: COLONOSCOPY WITH PROPOFOL;   Surgeon: Manya Silvas, MD;  Location: [facility] ENDOSCOPY;  Service: Endoscopy;  Laterality: N/A;  . ESOPHAGOGASTRODUODENOSCOPY     08/17/2010, 02/16/2005, no repeat per RTE  . ESOPHAGOGASTRODUODENOSCOPY (EGD) WITH PROPOFOL N/A 03/01/2015   Procedure: ESOPHAGOGASTRODUODENOSCOPY (EGD) WITH PROPOFOL;  Surgeon: Manya Silvas, MD;  Location: [facility] ENDOSCOPY;  Service: Endoscopy;  Laterality: N/A;  . ESOPHAGOGASTRODUODENOSCOPY (EGD) WITH PROPOFOL N/A 05/15/2016   Procedure: ESOPHAGOGASTRODUODENOSCOPY (EGD) WITH PROPOFOL;  Surgeon: Manya Silvas, MD;  Location: [facility] ENDOSCOPY;  Service: Endoscopy;  Laterality: N/A;    FAMILY HISTORY :   Family History  Problem Relation Age of Onset  . Diabetes Mother   . Breast cancer Sister   . Prostate cancer Neg Hx   . Bladder Cancer Neg Hx   . Kidney cancer Neg Hx     SOCIAL HISTORY:   Social History   Tobacco Use  . Smoking status: Former Smoker    Packs/day: 1.00    Years: 40.00    Pack years: 40.00    Types: Cigarettes    Last attempt to quit: 01/07/1993    Years since quitting: 24.5  . Smokeless tobacco: Never Used  Substance Use Topics  . Alcohol use: No    Alcohol/week: 0.0 oz  . Drug use: No    ALLERGIES:  has No Known Allergies.  MEDICATIONS:  Current Outpatient Medications  Medication Sig Dispense Refill  . amLODipine (  NORVASC) 10 MG tablet Reported on 03/21/2015    . Cyanocobalamin (RA VITAMIN B-12 TR) 1000 MCG TBCR Take by mouth.    . ferrous sulfate 325 (65 FE) MG tablet Take by mouth 2 (two) times daily with a meal.     . finasteride (PROSCAR) 5 MG tablet Take 1 tablet (5 mg total) by mouth daily. 90 tablet 3  . glimepiride (AMARYL) 2 MG tablet TAKE 1 TABLET DAILY WITH BREAKFAST    . metFORMIN (GLUCOPHAGE) 1000 MG tablet Take 1,000 mg by mouth 2 (two) times daily with a meal.     . omeprazole (PRILOSEC) 20 MG capsule TAKE 1 CAPSULE DAILY (CALL FOR APPOINTMENT)    . pioglitazone (ACTOS) 15 MG tablet TAKE 1 TABLET DAILY    .  ramipril (ALTACE) 10 MG capsule TAKE 1 CAPSULE DAILY    . sitaGLIPtin (JANUVIA) 100 MG tablet Take 100 mg by mouth daily.     . tamsulosin (FLOMAX) 0.4 MG CAPS capsule Take 1 capsule (0.4 mg total) by mouth daily. 90 capsule 3  . triamcinolone (KENALOG) 0.025 % cream Apply 1 application topically 2 (two) times daily as needed.      No current facility-administered medications for this visit.    Facility-Administered Medications Ordered in Other Visits  Medication Dose Route Frequency Provider Last Rate Last Dose  . iron sucrose (VENOFER) injection 200 mg  200 mg Intravenous Once Cammie Sickle, MD        PHYSICAL EXAMINATION:   BP (!) 145/64 (Heath Position: Sitting)   Pulse 76   Temp 97.9 F (36.6 C) (Tympanic)   Resp 20   Ht 5\' 6"  (1.676 m)   Wt 179 lb 14.3 oz (81.6 kg)   BMI Tommy.04 kg/m   Filed Weights   07/22/17 1333  Weight: 179 lb 14.3 oz (81.6 kg)     GENERAL: Well-nourished well-developed; Alert, no distress and comfortable. Alone.  EYES: No pallor no icterus. OROPHARYNX: no thrush or ulceration; good dentition  NECK: supple, no masses felt LYMPH: no palpable lymphadenopathy in the cervical, axillary or inguinal regions LUNGS: clear to auscultation and No wheeze or crackles HEART/CVS: regular rate & rhythm and no murmurs; No lower extremity edema ABDOMEN: abdomen soft, non-tender and normal bowel sounds Musculoskeletal:no cyanosis of digits and no clubbing  PSYCH: alert & oriented x 3 with fluent speech NEURO: no focal motor/sensory deficits SKIN: no rashes or significant lesions  LABORATORY DATA:  I have reviewed the data as listed    Component Value Date/Time   NA 140 07/17/2017 0953   K 3.8 07/17/2017 0953   CL 106 07/17/2017 0953   CO2 23 07/17/2017 0953   GLUCOSE 197 (H) 07/17/2017 0953   BUN 23 07/17/2017 0953   CREATININE 1.52 (H) 07/17/2017 0953   CALCIUM 8.8 (L) 07/17/2017 0953   PROT 7.4 07/17/2017 0953   ALBUMIN 3.7  07/17/2017 0953   AST 18 07/17/2017 0953   ALT 12 07/17/2017 0953   ALKPHOS 63 07/17/2017 0953   BILITOT 0.6 07/17/2017 0953   GFRNONAA 41 (L) 07/17/2017 0953   GFRAA 48 (L) 07/17/2017 0953    No results found for: SPEP, UPEP  Lab Results  Component Value Date   WBC 6.7 07/17/2017   NEUTROABS 4.8 07/17/2017   HGB 10.4 (L) 07/17/2017   HCT 32.3 (L) 07/17/2017   MCV 86.5 07/17/2017   PLT 285 07/17/2017      Chemistry      Component Value Date/Time   NA  140 07/17/2017 0953   K 3.8 07/17/2017 0953   CL 106 07/17/2017 0953   CO2 23 07/17/2017 0953   BUN 23 07/17/2017 0953   CREATININE 1.52 (H) 07/17/2017 0953      Component Value Date/Time   CALCIUM 8.8 (L) 07/17/2017 0953   ALKPHOS 63 07/17/2017 0953   AST 18 07/17/2017 0953   ALT 12 07/17/2017 0953   BILITOT 0.6 07/17/2017 0953       ASSESSMENT & PLAN:  Iron deficiency anemia due to chronic blood loss # Iron deficiency Anemia- likely from chronic GI blood loss [duodenal & colonic AV malformation]/CKD- stage III.  On PO iron. Hb- today 10.5; sat 16%; ferritin- 19. Worse.   # proceed with IV Venofer x2; today.  I discussed with the Heath that even after iron supplementation if his hemoglobin does not improve/the option would be Aranesp.  Discussed the mechanism of action potential side effects including but not limited to thromboembolic events stroke/etc. if hemoglobin target is greater than 13.  I would recommend holding off Aranesp at this time-as Heath's hemoglobin seems to be responding fairly well to IV iron at this time.  # CKD- stage III- creat 1.5/ stable. recommend monitoring PCP.   # Follow-up with [place] CBC and iron studies/ done a few days prior to the visit- in 6 months. Discussed with Heath in detail.    Cammie Sickle, MD 07/22/2017 6:04 PM

## 2017-07-22 NOTE — Assessment & Plan Note (Addendum)
#   Iron deficiency Anemia- likely from chronic GI blood loss [duodenal & colonic AV malformation]/CKD- stage III.  On PO iron. Hb- today 10.5; sat 16%; ferritin- 19. Worse.   # proceed with IV Venofer x2; today.  I discussed with the patient that even after iron supplementation if his hemoglobin does not improve/the option would be Aranesp.  Discussed the mechanism of action potential side effects including but not limited to thromboembolic events stroke/etc. if hemoglobin target is greater than 13.  I would recommend holding off Aranesp at this time-as patient's hemoglobin seems to be responding fairly well to IV iron at this time.  # CKD- stage III- creat 1.5/ stable. recommend monitoring PCP.   # Follow-up with [place] CBC and iron studies/ done a few days prior to the visit- in 6 months. Discussed with patient in detail.

## 2017-07-29 ENCOUNTER — Inpatient Hospital Stay: Payer: Medicare Other

## 2017-07-29 VITALS — BP 128/61 | HR 71 | Temp 97.0°F | Resp 18

## 2017-07-29 DIAGNOSIS — D500 Iron deficiency anemia secondary to blood loss (chronic): Secondary | ICD-10-CM

## 2017-07-29 MED ORDER — SODIUM CHLORIDE 0.9 % IV SOLN
Freq: Once | INTRAVENOUS | Status: AC
  Administered 2017-07-29: 14:00:00 via INTRAVENOUS
  Filled 2017-07-29: qty 1000

## 2017-07-29 MED ORDER — IRON SUCROSE 20 MG/ML IV SOLN
200.0000 mg | Freq: Once | INTRAVENOUS | Status: AC
Start: 2017-07-29 — End: 2017-07-29
  Administered 2017-07-29: 200 mg via INTRAVENOUS
  Filled 2017-07-29: qty 10

## 2017-07-29 NOTE — Patient Instructions (Signed)
Iron Sucrose injection What is this medicine? IRON SUCROSE (AHY ern SOO krohs) is an iron complex. Iron is used to make healthy red blood cells, which carry oxygen and nutrients throughout the body. This medicine is used to treat iron deficiency anemia in people with chronic kidney disease. This medicine may be used for other purposes; ask your health care provider or pharmacist if you have questions. COMMON BRAND NAME(S): Venofer What should I tell my health care provider before I take this medicine? They need to know if you have any of these conditions: -anemia not caused by low iron levels -heart disease -high levels of iron in the blood -kidney disease -liver disease -an unusual or allergic reaction to iron, other medicines, foods, dyes, or preservatives -pregnant or trying to get pregnant -breast-feeding How should I use this medicine? This medicine is for infusion into a vein. It is given by a health care professional in a hospital or clinic setting. Talk to your pediatrician regarding the use of this medicine in children. While this drug may be prescribed for children as young as 2 years for selected conditions, precautions do apply. Overdosage: If you think you have taken too much of this medicine contact a poison control center or emergency room at once. NOTE: This medicine is only for you. Do not share this medicine with others. What if I miss a dose? It is important not to miss your dose. Call your doctor or health care professional if you are unable to keep an appointment. What may interact with this medicine? Do not take this medicine with any of the following medications: -deferoxamine -dimercaprol -other iron products This medicine may also interact with the following medications: -chloramphenicol -deferasirox This list may not describe all possible interactions. Give your health care provider a list of all the medicines, herbs, non-prescription drugs, or dietary  supplements you use. Also tell them if you smoke, drink alcohol, or use illegal drugs. Some items may interact with your medicine. What should I watch for while using this medicine? Visit your doctor or healthcare professional regularly. Tell your doctor or healthcare professional if your symptoms do not start to get better or if they get worse. You may need blood work done while you are taking this medicine. You may need to follow a special diet. Talk to your doctor. Foods that contain iron include: whole grains/cereals, dried fruits, beans, or peas, leafy green vegetables, and organ meats (liver, kidney). What side effects may I notice from receiving this medicine? Side effects that you should report to your doctor or health care professional as soon as possible: -allergic reactions like skin rash, itching or hives, swelling of the face, lips, or tongue -breathing problems -changes in blood pressure -cough -fast, irregular heartbeat -feeling faint or lightheaded, falls -fever or chills -flushing, sweating, or hot feelings -joint or muscle aches/pains -seizures -swelling of the ankles or feet -unusually weak or tired Side effects that usually do not require medical attention (report to your doctor or health care professional if they continue or are bothersome): -diarrhea -feeling achy -headache -irritation at site where injected -nausea, vomiting -stomach upset -tiredness This list may not describe all possible side effects. Call your doctor for medical advice about side effects. You may report side effects to FDA at 1-800-FDA-1088. Where should I keep my medicine? This drug is given in a hospital or clinic and will not be stored at home. NOTE: This sheet is a summary. It may not cover all possible information. If   you have questions about this medicine, talk to your doctor, pharmacist, or health care provider.  2018 Elsevier/Gold Standard (2010-10-04 17:14:35)  

## 2017-10-31 ENCOUNTER — Ambulatory Visit: Payer: Medicare Other | Admitting: Urology

## 2017-11-03 ENCOUNTER — Ambulatory Visit (INDEPENDENT_AMBULATORY_CARE_PROVIDER_SITE_OTHER): Payer: Medicare Other | Admitting: Urology

## 2017-11-03 ENCOUNTER — Encounter: Payer: Self-pay | Admitting: Urology

## 2017-11-03 VITALS — BP 146/72 | HR 72 | Ht 66.0 in | Wt 181.0 lb

## 2017-11-03 DIAGNOSIS — N138 Other obstructive and reflux uropathy: Secondary | ICD-10-CM | POA: Diagnosis not present

## 2017-11-03 DIAGNOSIS — N35811 Other urethral stricture, male, meatal: Secondary | ICD-10-CM | POA: Diagnosis not present

## 2017-11-03 DIAGNOSIS — N401 Enlarged prostate with lower urinary tract symptoms: Secondary | ICD-10-CM

## 2017-11-03 DIAGNOSIS — R351 Nocturia: Secondary | ICD-10-CM | POA: Diagnosis not present

## 2017-11-03 LAB — BLADDER SCAN AMB NON-IMAGING

## 2017-11-03 NOTE — Progress Notes (Signed)
11/03/2017 9:42 AM   Tommy Heath [birth year] [id number]  Referring provider: Sherrin Daisy, MD No address on file  Chief Complaint  Patient presents with  . Benign Prostatic Hypertrophy    HPI: Patient is an 84 year old African American male who presents today for a 6 month follow up for BPH with LU TS.  BPH WITH LUTS His IPSS score today is 22,  which is severe lower urinary tract symptomatology. He is unhappy with his quality life due to his urinary symptoms.  His PVR is 78 mL.  His previous I PSS score was 9/3.  His previous PVR was 22 mL.  His major complaints today are frequency x q 2 to 3 hours during the day, strong urgency, nocturia x q 2 hours and urge incontinence.   He denies any spraying or splitting of the stream.  He does state the stream is very weak at times and takes a long time to drain out.  He denies any dysuria, hematuria or suprapubic pain.  This has been going on for the last month or two.  He currently taking tamsulosin 0.4 mg daily and finasteride 5 mg daily.   He does not have a family history of PCa.  He is drinking two 16 ounces of water daily.  He has eliminated sodas.  He is not decreasing fluid at night.  He has cut back on his salt.   IPSS    Row Name 11/03/17 0900         International Prostate Symptom Score   How often have you had the sensation of not emptying your bladder?  More than half the time     How often have you had to urinate less than every two hours?  More than half the time     How often have you found you stopped and started again several times when you urinated?  Less than 1 in 5 times     How often have you found it difficult to postpone urination?  Almost always     How often have you had a weak urinary stream?  About half the time     How often have you had to strain to start urination?  Not at All     How many times did you typically get up at night to urinate?  5 Times     Total IPSS Score  22       Quality of Life due to  urinary symptoms   If you were to spend the rest of your life with your urinary condition just the way it is now how would you feel about that?  Unhappy        Score:  1-7 Mild 8-19 Moderate 20-35 Severe     PMH: Past Medical History:  Diagnosis Date  . Allergic rhinitis   . Anemia   . AVM (arteriovenous malformation) of colon   . BPH (benign prostatic hyperplasia)   . Cataract cortical, senile   . Colon polyps 02/2005, 2012   TUBULAR ADENOMA  . Diabetes mellitus type 2, uncomplicated ([place])   . Duodenal ulcer    w/ gastric ulcer  . Glaucoma   . H. pylori infection   . H. pylori infection   . History of blood product transfusion   . History of blood transfusion 02/2005   hgb was 6  . History of duodenal ulcer 02/2005   The lesion was 5 mm in largest dimension.  . Hyperlipidemia   .  Hypertension   . Sleep apnea    BiPap    Surgical History: Past Surgical History:  Procedure Laterality Date  . COLONOSCOPY     07/2010, 09/09/2002, 10/15/2002, 02/16/2005, Adenomatous polyps, repeat 5 years, Dr Vira Agar  . COLONOSCOPY WITH PROPOFOL N/A 03/01/2015   Procedure: COLONOSCOPY WITH PROPOFOL;  Surgeon: Manya Silvas, MD;  Location: [facility] ENDOSCOPY;  Service: Endoscopy;  Laterality: N/A;  . ESOPHAGOGASTRODUODENOSCOPY     08/17/2010, 02/16/2005, no repeat per RTE  . ESOPHAGOGASTRODUODENOSCOPY (EGD) WITH PROPOFOL N/A 03/01/2015   Procedure: ESOPHAGOGASTRODUODENOSCOPY (EGD) WITH PROPOFOL;  Surgeon: Manya Silvas, MD;  Location: [facility] ENDOSCOPY;  Service: Endoscopy;  Laterality: N/A;  . ESOPHAGOGASTRODUODENOSCOPY (EGD) WITH PROPOFOL N/A 05/15/2016   Procedure: ESOPHAGOGASTRODUODENOSCOPY (EGD) WITH PROPOFOL;  Surgeon: Manya Silvas, MD;  Location[facility] ENDOSCOPY;  Service: Endoscopy;  Laterality: N/A;    Home Medications:  Allergies as of 11/03/2017   No Known Allergies     Medication List        Accurate as of 11/03/17  9:42 AM. Always use your most recent med list.            amLODipine 10 MG tablet Commonly known as:  NORVASC Reported on 03/21/2015   azithromycin 500 MG tablet Commonly known as:  ZITHROMAX   ferrous sulfate 325 (65 FE) MG tablet Take by mouth 2 (two) times daily with a meal.   finasteride 5 MG tablet Commonly known as:  PROSCAR Take 1 tablet (5 mg total) by mouth daily.   glimepiride 2 MG tablet Commonly known as:  AMARYL TAKE 1 TABLET DAILY WITH BREAKFAST   ibuprofen 800 MG tablet Commonly known as:  ADVIL,MOTRIN   JANUVIA 100 MG tablet Generic drug:  sitaGLIPtin Take 100 mg by mouth daily.   metFORMIN 1000 MG tablet Commonly known as:  GLUCOPHAGE Take 1,000 mg by mouth 2 (two) times daily with a meal.   omeprazole 20 MG capsule Commonly known as:  PRILOSEC TAKE 1 CAPSULE DAILY (CALL FOR APPOINTMENT)   pioglitazone 15 MG tablet Commonly known as:  ACTOS TAKE 1 TABLET DAILY   RA VITAMIN B-12 TR 1000 MCG Tbcr Generic drug:  Cyanocobalamin Take by mouth.   ramipril 10 MG capsule Commonly known as:  ALTACE TAKE 1 CAPSULE DAILY   tamsulosin 0.4 MG Caps capsule Commonly known as:  FLOMAX Take 1 capsule (0.4 mg total) by mouth daily.   triamcinolone 0.025 % cream Commonly known as:  KENALOG Apply 1 application topically 2 (two) times daily as needed.       Allergies: No Known Allergies  Family History: Family History  Problem Relation Age of Onset  . Diabetes Mother   . Breast cancer Sister   . Prostate cancer Neg Hx   . Bladder Cancer Neg Hx   . Kidney cancer Neg Hx     Social History:  reports that he quit smoking about 24 years ago. His smoking use included cigarettes. He has a 40.00 pack-year smoking history. He has never used smokeless tobacco. He reports that he does not drink alcohol or use drugs.  ROS: UROLOGY Frequent Urination?: Yes Hard to postpone urination?: Yes Burning/pain with urination?: Yes Get up at night to urinate?: Yes Leakage of urine?: Yes Urine stream starts and stops?:  No Trouble starting stream?: No Do you have to strain to urinate?: No Blood in urine?: No Urinary tract infection?: No Sexually transmitted disease?: No Injury to kidneys or bladder?: No Painful intercourse?: No Weak stream?: No Erection problems?:  No Penile pain?: No  Gastrointestinal Nausea?: No Vomiting?: No Indigestion/heartburn?: No Diarrhea?: No Constipation?: No  Constitutional Fever: No Night sweats?: No Weight loss?: No Fatigue?: No  Skin Skin rash/lesions?: No Itching?: No  Eyes Blurred vision?: No Double vision?: No  Ears/Nose/Throat Sore throat?: No Sinus problems?: No  Hematologic/Lymphatic Swollen glands?: No Easy bruising?: No  Cardiovascular Leg swelling?: Yes Chest pain?: No  Respiratory Cough?: No Shortness of breath?: No  Endocrine Excessive thirst?: No  Musculoskeletal Back pain?: No Joint pain?: No  Neurological Headaches?: No Dizziness?: No  Psychologic Depression?: No Anxiety?: No  Physical Exam: BP (!) 146/72 (BP Location: Left Arm, Patient Position: Sitting, Cuff Size: Large)   Pulse 72   Ht 5\' 6"  (1.676 m)   Wt 181 lb (82.1 kg)   BMI 29.21 kg/m   Constitutional: Well nourished. Alert and oriented, No acute distress. HEENT: [place] AT, moist mucus membranes. Trachea midline, no masses. Cardiovascular: No clubbing, cyanosis, or edema. Respiratory: Normal respiratory effort, no increased work of breathing. GI: Abdomen is soft, non tender, non distended, no abdominal masses. Liver and spleen not palpable.  No hernias appreciated.  Stool sample for occult testing is not indicated.   GU: No CVA tenderness.  No bladder fullness or masses.  Patient with circumcised phallus. Urethral meatus with stenosis.  No penile discharge. No penile lesions or rashes. Scrotum without lesions, cysts, rashes and/or edema.  Testicles are located scrotally bilaterally. No masses are appreciated in the testicles. Left and right epididymis are  normal. Rectal: Patient with  normal sphincter tone. Anus and perineum without scarring or rashes. No rectal masses are appreciated. Prostate is approximately 50 grams, firm, no nodules are appreciated. Seminal vesicles are normal. Skin: No rashes, bruises or suspicious lesions. Lymph: No cervical or inguinal adenopathy. Neurologic: Grossly intact, no focal deficits, moving all 4 extremities. Psychiatric: Normal mood and affect.   Laboratory Data: Lab Results  Component Value Date   WBC 6.7 07/17/2017   HGB 10.4 (L) 07/17/2017   HCT 32.3 (L) 07/17/2017   MCV 86.5 07/17/2017   PLT 285 07/17/2017   I have reviewed the labs.  Assessment & Plan:    1. BPH with LUTS IPSS score is 22/5, it is worsening Continue conservative management, avoiding bladder irritants and timed voiding's Most bothersome symptom is urgency and urge incontinence  Continue tamsulosin 0.4 mg daily and finasteride 5 mg daily; refill Still symptomatic on maximum medical therapy - will schedule cystoscopy to evaluate for strictures/BOO I have explained to the patient that they will  be scheduled for a cystoscopy in our office to evaluate their bladder.  The cystoscopy consists of passing a tube with a lens up through their urethra and into their urinary bladder.   We will inject the urethra with a lidocaine gel prior to introducing the cystoscope to help with any discomfort during the procedure.   After the procedure, they might experience blood in the urine and discomfort with urination.  This will abate after the first few voids.  I have  encouraged the patient to increase water intake  during this time.  Patient denies any allergies to lidocaine.   2. Meatal stenosis Cysto pending  3. Nocturia Worsening  Cysto pending - if no stricture or BOO seen on cysto will reassess   No follow-ups on file.  These notes generated with voice recognition software. I apologize for typographical errors.  Zara Council,  [facility] [address] [place],  [place] [address] [phone]

## 2017-11-27 ENCOUNTER — Other Ambulatory Visit: Payer: Self-pay

## 2017-11-27 DIAGNOSIS — N138 Other obstructive and reflux uropathy: Secondary | ICD-10-CM

## 2017-11-27 DIAGNOSIS — N401 Enlarged prostate with lower urinary tract symptoms: Principal | ICD-10-CM

## 2017-11-28 ENCOUNTER — Other Ambulatory Visit
Admission: RE | Admit: 2017-11-28 | Discharge: 2017-11-28 | Disposition: A | Discharge status: Home / Self Care | Payer: Medicare Other | Source: Ambulatory Visit | Attending: Urology | Admitting: Urology

## 2017-11-28 ENCOUNTER — Encounter: Payer: Self-pay | Admitting: Urology

## 2017-11-28 ENCOUNTER — Ambulatory Visit (INDEPENDENT_AMBULATORY_CARE_PROVIDER_SITE_OTHER): Payer: Medicare Other | Admitting: Urology

## 2017-11-28 ENCOUNTER — Other Ambulatory Visit: Payer: Self-pay

## 2017-11-28 VITALS — BP 160/77 | HR 74 | Wt 187.0 lb

## 2017-11-28 DIAGNOSIS — N401 Enlarged prostate with lower urinary tract symptoms: Secondary | ICD-10-CM | POA: Insufficient documentation

## 2017-11-28 DIAGNOSIS — N138 Other obstructive and reflux uropathy: Secondary | ICD-10-CM

## 2017-11-28 LAB — URINALYSIS, COMPLETE (UACMP) WITH MICROSCOPIC
BILIRUBIN URINE: NEGATIVE
GLUCOSE, UA: NEGATIVE mg/dL
HGB URINE DIPSTICK: NEGATIVE
Ketones, ur: NEGATIVE mg/dL
Leukocytes, UA: NEGATIVE
Nitrite: NEGATIVE
Specific Gravity, Urine: 1.015 (ref 1.005–1.030)
pH: 5 (ref 5.0–8.0)

## 2017-11-28 NOTE — Progress Notes (Signed)
   11/28/17  CC:  Chief Complaint  Patient presents with  . Cysto    HPI: 84 year old male with refractory urinary symptoms despite maximal medical therapy with Flomax and finasteride who presents today for cystoscopy for further evaluation of his voiding symptoms and consideration of surgical intervention.  He continues to have severe urinary symptoms which are both irritative and obstructive in nature.  Blood pressure (!) 160/77, pulse 74, weight 187 lb (84.8 kg). NED. A&Ox3.   No respiratory distress   Abd soft, NT, ND Normal phallus with bilateral descended testicles  Cystoscopy Procedure Note  Patient identification was confirmed, informed consent was obtained, and patient was prepped using Betadine solution.  Lidocaine jelly was administered per urethral meatus.     Pre-Procedure: - Inspection reveals a normal caliber ureteral meatus.  Procedure: The flexible cystoscope was introduced without difficulty - No urethral strictures/lesions are present. - Enlarged prostate trilobar coaptation, 4 cm - mildly elevated bladder neck - Bilateral ureteral orifices identified - Bladder mucosa  reveals no ulcers, tumors, or lesions - No bladder stones - Mild trabeculation with small diverticulum at dome  Retroflexion shows very mild intravesical protrusion    Post-Procedure: - Patient tolerated the procedure well  Assessment/ Plan:  1. BPH with obstruction/lower urinary tract symptoms Cystoscopy as above, mildly enlarged prostate with slightly elevated bladder neck without a discrete median lobe.  He does have sequela of chronic outlet obstruction including a small diverticulum at the dome.  Given he is on maximal medical therapy in the form of finasteride and Flomax, he may benefit from an outlet procedure to help with his urinary symptoms.  Unfortunately, we do not have a prostate volume on him.  I recommended that he return to the office for TRUS for prostate sizing and  then will further discuss with him his possible outlet procedures which may include UroLift, TURP, holmium laser enucleation of the prostate depending on the size.  He is agreeable this plan.   Return for TRUS prostate (biopsy slot).  Hollice Espy, MD

## 2018-01-05 ENCOUNTER — Telehealth: Payer: Self-pay | Admitting: Urology

## 2018-01-05 DIAGNOSIS — R3915 Urgency of urination: Secondary | ICD-10-CM

## 2018-01-05 MED ORDER — FINASTERIDE 5 MG PO TABS: 5.0000 mg | ORAL_TABLET | Freq: Every day | ORAL | 0 refills | Status: DC

## 2018-01-05 MED ORDER — TAMSULOSIN HCL 0.4 MG PO CAPS: 0.4000 mg | ORAL_CAPSULE | Freq: Every day | ORAL | 0 refills | Status: DC

## 2018-01-05 NOTE — Telephone Encounter (Signed)
No DPR on file, so vm was left for pt to call back to inform him refills were sent to [facility]

## 2018-01-05 NOTE — Telephone Encounter (Signed)
Patient is calling and asking for a refill on his tamsulosin and finasteride he does a 90 day supply of each    To be called into the [facility] in [place] he no longer wants to use the mail order pharmacy

## 2018-01-15 ENCOUNTER — Inpatient Hospital Stay: Payer: Medicare Other | Attending: Internal Medicine

## 2018-01-15 ENCOUNTER — Other Ambulatory Visit: Payer: Medicare Other

## 2018-01-15 DIAGNOSIS — N183 Chronic kidney disease, stage 3 (moderate): Secondary | ICD-10-CM | POA: Diagnosis not present

## 2018-01-15 DIAGNOSIS — E1122 Type 2 diabetes mellitus with diabetic chronic kidney disease: Secondary | ICD-10-CM | POA: Diagnosis not present

## 2018-01-15 DIAGNOSIS — D500 Iron deficiency anemia secondary to blood loss (chronic): Secondary | ICD-10-CM | POA: Diagnosis not present

## 2018-01-15 DIAGNOSIS — Z87891 Personal history of nicotine dependence: Secondary | ICD-10-CM | POA: Diagnosis not present

## 2018-01-15 DIAGNOSIS — R350 Frequency of micturition: Secondary | ICD-10-CM | POA: Insufficient documentation

## 2018-01-15 DIAGNOSIS — I129 Hypertensive chronic kidney disease with stage 1 through stage 4 chronic kidney disease, or unspecified chronic kidney disease: Secondary | ICD-10-CM | POA: Diagnosis not present

## 2018-01-15 DIAGNOSIS — Q2733 Arteriovenous malformation of digestive system vessel: Secondary | ICD-10-CM | POA: Insufficient documentation

## 2018-01-15 LAB — COMPREHENSIVE METABOLIC PANEL
ALK PHOS: 60 U/L (ref 38–126)
ALT: 8 U/L (ref 0–44)
AST: 16 U/L (ref 15–41)
Albumin: 3.9 g/dL (ref 3.5–5.0)
Anion gap: 8 (ref 5–15)
BILIRUBIN TOTAL: 0.7 mg/dL (ref 0.3–1.2)
BUN: 23 mg/dL (ref 8–23)
CO2: 25 mmol/L (ref 22–32)
CREATININE: 1.74 mg/dL — AB (ref 0.61–1.24)
Calcium: 8.7 mg/dL — ABNORMAL LOW (ref 8.9–10.3)
Chloride: 107 mmol/L (ref 98–111)
GFR, EST AFRICAN AMERICAN: 41 mL/min — AB (ref >60)
GFR, EST NON AFRICAN AMERICAN: 36 mL/min — AB (ref >60)
Glucose, Bld: 154 mg/dL — ABNORMAL HIGH (ref 70–99)
Potassium: 3.5 mmol/L (ref 3.5–5.1)
Sodium: 140 mmol/L (ref 135–145)
Total Protein: 7.3 g/dL (ref 6.5–8.1)

## 2018-01-15 LAB — IRON AND TIBC
IRON: 79 ug/dL (ref 45–182)
Saturation Ratios: 21 % (ref 17.9–39.5)
TIBC: 377 ug/dL (ref 250–450)
UIBC: 298 ug/dL

## 2018-01-15 LAB — CBC WITH DIFFERENTIAL/PLATELET
ABS IMMATURE GRANULOCYTES: 0.02 10*3/uL (ref 0.00–0.07)
BASOS ABS: 0.1 10*3/uL (ref 0.0–0.1)
Basophils Relative: 1 %
EOS PCT: 2 %
Eosinophils Absolute: 0.1 10*3/uL (ref 0.0–0.5)
HCT: 33.1 % — ABNORMAL LOW (ref 39.0–52.0)
HEMOGLOBIN: 10.4 g/dL — AB (ref 13.0–17.0)
IMMATURE GRANULOCYTES: 0 %
Lymphocytes Relative: 23 %
Lymphs Abs: 1.2 10*3/uL (ref 0.7–4.0)
MCH: 27.3 pg (ref 26.0–34.0)
MCHC: 31.4 g/dL (ref 30.0–36.0)
MCV: 86.9 fL (ref 80.0–100.0)
Monocytes Absolute: 0.5 10*3/uL (ref 0.1–1.0)
Monocytes Relative: 10 %
NRBC: 0 % (ref 0.0–0.2)
Neutro Abs: 3.2 10*3/uL (ref 1.7–7.7)
Neutrophils Relative %: 64 %
Platelets: 258 10*3/uL (ref 150–400)
RBC: 3.81 MIL/uL — AB (ref 4.22–5.81)
RDW: 16.3 % — ABNORMAL HIGH (ref 11.5–15.5)
WBC: 5.1 10*3/uL (ref 4.0–10.5)

## 2018-01-15 LAB — FERRITIN: Ferritin: 12 ng/mL — ABNORMAL LOW (ref 24–336)

## 2018-01-19 ENCOUNTER — Other Ambulatory Visit: Payer: Self-pay | Admitting: Internal Medicine

## 2018-01-20 ENCOUNTER — Inpatient Hospital Stay: Payer: Medicare Other

## 2018-01-20 ENCOUNTER — Encounter: Payer: Self-pay | Admitting: Internal Medicine

## 2018-01-20 ENCOUNTER — Inpatient Hospital Stay (HOSPITAL_BASED_OUTPATIENT_CLINIC_OR_DEPARTMENT_OTHER): Payer: Medicare Other | Admitting: Internal Medicine

## 2018-01-20 ENCOUNTER — Other Ambulatory Visit: Payer: Self-pay

## 2018-01-20 VITALS — BP 159/72 | HR 82 | Temp 97.6°F | Resp 20 | Ht 66.0 in | Wt 186.0 lb

## 2018-01-20 DIAGNOSIS — D500 Iron deficiency anemia secondary to blood loss (chronic): Secondary | ICD-10-CM

## 2018-01-20 DIAGNOSIS — Q2733 Arteriovenous malformation of digestive system vessel: Secondary | ICD-10-CM | POA: Diagnosis not present

## 2018-01-20 DIAGNOSIS — I129 Hypertensive chronic kidney disease with stage 1 through stage 4 chronic kidney disease, or unspecified chronic kidney disease: Secondary | ICD-10-CM | POA: Diagnosis not present

## 2018-01-20 DIAGNOSIS — N183 Chronic kidney disease, stage 3 (moderate): Secondary | ICD-10-CM

## 2018-01-20 DIAGNOSIS — R350 Frequency of micturition: Secondary | ICD-10-CM

## 2018-01-20 DIAGNOSIS — Z87891 Personal history of nicotine dependence: Secondary | ICD-10-CM

## 2018-01-20 DIAGNOSIS — E1122 Type 2 diabetes mellitus with diabetic chronic kidney disease: Secondary | ICD-10-CM

## 2018-01-20 MED ORDER — SODIUM CHLORIDE 0.9 % IV SOLN
Freq: Once | INTRAVENOUS | Status: AC
  Administered 2018-01-20: 14:00:00 via INTRAVENOUS
  Filled 2018-01-20: qty 250

## 2018-01-20 MED ORDER — IRON SUCROSE 20 MG/ML IV SOLN
200.0000 mg | Freq: Once | INTRAVENOUS | Status: AC
  Administered 2018-01-20: 200 mg via INTRAVENOUS

## 2018-01-20 MED ORDER — SODIUM CHLORIDE 0.9 % IV SOLN: 200.0000 mg | Freq: Once | INTRAVENOUS | Status: DC

## 2018-01-20 MED ORDER — IRON SUCROSE 20 MG/ML IV SOLN
INTRAVENOUS | Status: AC
  Filled 2018-01-20: qty 10

## 2018-01-20 NOTE — Assessment & Plan Note (Addendum)
#   Iron deficiency Anemia- likely from chronic GI blood loss [duodenal & colonic AV malformation]/CKD- stage III.  On PO iron. Hb- today 10.4; saturation is 20% ferritin is 12.  Worse.  #Recommend IV Venofer today; continue p.o. iron twice a day.  Discussed with the patient that if his iron numbers are adequate but the anemia does not improve-then a trial of Aranesp would be recommended.  # CKD- stage III- creat 1.74/ worsening. Discussed re: needing to get US kidney/UA- defer to pt.pt appt with urology soon/ [facility].   #Hypertension-discussed that poorly controlled blood pressure 349 systolic/in conjunction with diabetes contributing to his renal dysfunction.  Recommend close follow-up with PCP.  #  DISPOSITION: # Venofer today # Follow-up in 6 monthsw[address] Dr.Corcoran- CBC/iron studiesbmp/ferritin/possibl venofer-Dr.B

## 2018-01-20 NOTE — Progress Notes (Signed)
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 PO Iron BID  # CKD- stage III [creatinine ~1.5]  INTERVAL HISTORY:  85 -year-old male patient with with above history of iron deficiency anemia likely from chronic GI blood loss/  Secondary to above GI blood loss is here for follow-up.  Patient complains of frequent urination at nighttime.  He has been followed by urology.  He is awaiting repeat evaluation this week.  Continues to be on p.o. iron twice a day.  Complains of mild to moderate fatigue.  Admits to black stools from his iron otherwise no blood in stools.  No nausea no vomiting no constipation.  Review of Systems  Constitutional: Positive for malaise/fatigue. Negative for chills, diaphoresis, fever and weight loss.  HENT: Negative for nosebleeds and sore throat.   Eyes: Negative for double vision.  Respiratory: Negative for cough, hemoptysis, sputum production, shortness of breath and wheezing.   Cardiovascular: Negative for chest pain, palpitations, orthopnea and leg swelling.  Gastrointestinal: Negative for abdominal pain, blood in stool, constipation, diarrhea, heartburn, melena, nausea and vomiting.  Genitourinary: Negative for dysuria, frequency and urgency.  Musculoskeletal: Negative for back pain and joint pain.  Skin: Negative.  Negative for itching and rash.  Neurological: Negative for dizziness, tingling, focal weakness, weakness and headaches.  Endo/Heme/Allergies: Does not bruise/bleed easily.  Psychiatric/Behavioral: Negative for depression. The  patient is not nervous/anxious and does not have insomnia.      PAST MEDICAL HISTORY :  Past Medical History:  Diagnosis Date  . Allergic rhinitis   . Anemia   . AVM (arteriovenous malformation) of colon   . BPH (benign prostatic hyperplasia)   . Cataract cortical, senile   . Colon polyps 02/2005, 2012   TUBULAR ADENOMA  . Diabetes mellitus type 2, uncomplicated ([place])   . Duodenal ulcer    w/ gastric ulcer  . Glaucoma   . H. pylori infection   . H. pylori infection   . History of blood product transfusion   . History of blood transfusion 02/2005   hgb was 6  . History of duodenal ulcer 02/2005   The lesion was 5 mm in largest dimension.  . Hyperlipidemia   . Hypertension   . Sleep apnea    BiPap    PAST SURGICAL HISTORY :   Past Surgical History:  Procedure Laterality Date  . COLONOSCOPY     07/2010, 09/09/2002, 10/15/2002, 02/16/2005, Adenomatous polyps, repeat 5 years, Dr Vira Agar  . COLONOSCOPY WITH PROPOFOL N/A 03/01/2015   Procedure: COLONOSCOPY WITH PROPOFOL;  Surgeon: Manya Silvas, MD;  Location: [facility] ENDOSCOPY;  Service: Endoscopy;  Laterality: N/A;  . ESOPHAGOGASTRODUODENOSCOPY     08/17/2010, 02/16/2005, no repeat per RTE  . ESOPHAGOGASTRODUODENOSCOPY (EGD) WITH PROPOFOL N/A 03/01/2015   Procedure: ESOPHAGOGASTRODUODENOSCOPY (EGD) WITH PROPOFOL;  Surgeon: Manya Silvas, MD;  Location: [facility] ENDOSCOPY;  Service: Endoscopy;  Laterality: N/A;  . ESOPHAGOGASTRODUODENOSCOPY (EGD) WITH PROPOFOL N/A 05/15/2016   Procedure: ESOPHAGOGASTRODUODENOSCOPY (EGD) WITH PROPOFOL;  Surgeon: Manya Silvas, MD;  Location: [facility] ENDOSCOPY;  Service: Endoscopy;  Laterality: N/A;    FAMILY HISTORY :   Family  History  Problem Relation Age of Onset  . Diabetes Mother   . Breast cancer Sister   . Prostate cancer Neg Hx   . Bladder Cancer Neg Hx   . Kidney cancer Neg Hx     SOCIAL HISTORY:   Social History   Tobacco Use  . Smoking status: Former Smoker    Packs/day: 1.00     Years: 40.00    Pack years: 40.00    Types: Cigarettes    Last attempt to quit: 01/07/1993    Years since quitting: 25.0  . Smokeless tobacco: Never Used  Substance Use Topics  . Alcohol use: No    Alcohol/week: 0.0 standard drinks  . Drug use: No    ALLERGIES:  has No Known Allergies.  MEDICATIONS:  Current Outpatient Medications  Medication Sig Dispense Refill  . amLODipine (NORVASC) 10 MG tablet Reported on 03/21/2015    . Cyanocobalamin (RA VITAMIN B-12 TR) 1000 MCG TBCR Take by mouth.    . ferrous sulfate 325 (65 FE) MG tablet Take by mouth 2 (two) times daily with a meal.     . finasteride (PROSCAR) 5 MG tablet Take 1 tablet (5 mg total) by mouth daily. 90 tablet 0  . glimepiride (AMARYL) 2 MG tablet TAKE 1 TABLET DAILY WITH BREAKFAST    . metFORMIN (GLUCOPHAGE) 1000 MG tablet Take 1,000 mg by mouth 2 (two) times daily with a meal.     . omeprazole (PRILOSEC) 20 MG capsule TAKE 1 CAPSULE DAILY (CALL FOR APPOINTMENT)    . pioglitazone (ACTOS) 15 MG tablet TAKE 1 TABLET DAILY    . ramipril (ALTACE) 10 MG capsule TAKE 1 CAPSULE DAILY    . sitaGLIPtin (JANUVIA) 100 MG tablet Take 100 mg by mouth daily.     . tamsulosin (FLOMAX) 0.4 MG CAPS capsule Take 1 capsule (0.4 mg total) by mouth daily. 90 capsule 0  . triamcinolone (KENALOG) 0.025 % cream Apply 1 application topically 2 (two) times daily as needed.     Marland Kitchen ibuprofen (ADVIL,MOTRIN) 800 MG tablet      No current facility-administered medications for this visit.    Facility-Administered Medications Ordered in Other Visits  Medication Dose Route Frequency Provider Last Rate Last Dose  . iron sucrose (VENOFER) injection 200 mg  200 mg Intravenous Once Cammie Sickle, MD        PHYSICAL EXAMINATION:   BP (!) 159/72 (BP Location: Left Arm, Patient Position: Sitting)   Pulse 82   Temp 97.6 F (36.4 C) (Tympanic)   Resp 20   Ht 5\' 6"  (1.676 m)   Wt 186 lb (84.4 kg)   BMI 30.02 kg/m   Filed Weights   01/20/18 1341   Weight: 186 lb (84.4 kg)    Physical Exam  Constitutional: He is oriented to person, place, and time and well-developed, well-nourished, and in no distress.  Alone.  Walking by himself.  HENT:  Head: Normocephalic and atraumatic.  Mouth/Throat: Oropharynx is clear and moist. No oropharyngeal exudate.  Eyes: Pupils are equal, round, and reactive to light.  Neck: Normal range of motion. Neck supple.  Cardiovascular: Normal rate and regular rhythm.  Pulmonary/Chest: No respiratory distress. He has no wheezes.  Abdominal: Soft. Bowel sounds are normal. He exhibits no distension and no mass. There is no abdominal tenderness. There is no rebound and no guarding.  Musculoskeletal: Normal range of motion.        General: Edema (Mild bilateral lower extremity swelling.) present. No tenderness.  Neurological: He is alert and oriented to person, place, and time.  Skin: Skin is warm.  Psychiatric: Affect normal.     LABORATORY DATA:  I have reviewed the data as listed    Component Value Date/Time   NA 140 01/15/2018 1117   K 3.5 01/15/2018 1117   CL 107 01/15/2018 1117   CO2 25 01/15/2018 1117   GLUCOSE 154 (H) 01/15/2018 1117   BUN 23 01/15/2018 1117   CREATININE 1.74 (H) 01/15/2018 1117   CALCIUM 8.7 (L) 01/15/2018 1117   PROT 7.3 01/15/2018 1117   ALBUMIN 3.9 01/15/2018 1117   AST 16 01/15/2018 1117   ALT 8 01/15/2018 1117   ALKPHOS 60 01/15/2018 1117   BILITOT 0.7 01/15/2018 1117   GFRNONAA 36 (L) 01/15/2018 1117   GFRAA 41 (L) 01/15/2018 1117    No results found for: SPEP, UPEP  Lab Results  Component Value Date   WBC 5.1 01/15/2018   NEUTROABS 3.2 01/15/2018   HGB 10.4 (L) 01/15/2018   HCT 33.1 (L) 01/15/2018   MCV 86.9 01/15/2018   PLT 258 01/15/2018      Chemistry      Component Value Date/Time   NA 140 01/15/2018 1117   K 3.5 01/15/2018 1117   CL 107 01/15/2018 1117   CO2 25 01/15/2018 1117   BUN 23 01/15/2018 1117   CREATININE 1.74 (H) 01/15/2018 1117       Component Value Date/Time   CALCIUM 8.7 (L) 01/15/2018 1117   ALKPHOS 60 01/15/2018 1117   AST 16 01/15/2018 1117   ALT 8 01/15/2018 1117   BILITOT 0.7 01/15/2018 1117       ASSESSMENT & PLAN:  Iron deficiency anemia due to chronic blood loss # Iron deficiency Anemia- likely from chronic GI blood loss [duodenal & colonic AV malformation]/CKD- stage III.  On PO iron. Hb- today 10.4; saturation is 20% ferritin is 12.  Worse.  #Recommend IV Venofer today; continue p.o. iron twice a day.  Discussed with the patient that if his iron numbers are adequate but the anemia does not improve-then a trial of Aranesp would be recommended.  # CKD- stage III- creat 1.74/ worsening. Discussed re: needing to get US kidney/UA- defer to pt.pt appt with urology soon/ [facility].   #Hypertension-discussed that poorly controlled blood pressure 248 systolic/in conjunction with diabetes contributing to his renal dysfunction.  Recommend close follow-up with PCP.  #  DISPOSITION: # Venofer today # Follow-up in [place]- CBC/iron studiesbmp/ferritin/possibl venofer-Dr.B    Cammie Sickle, MD 01/20/2018 3:29 PM

## 2018-01-21 ENCOUNTER — Encounter: Payer: Self-pay | Admitting: Urology

## 2018-01-21 ENCOUNTER — Ambulatory Visit (INDEPENDENT_AMBULATORY_CARE_PROVIDER_SITE_OTHER): Payer: Medicare Other | Admitting: Urology

## 2018-01-21 VITALS — BP 158/75 | HR 97 | Ht 66.0 in | Wt 184.0 lb

## 2018-01-21 DIAGNOSIS — R351 Nocturia: Secondary | ICD-10-CM

## 2018-01-21 DIAGNOSIS — N138 Other obstructive and reflux uropathy: Secondary | ICD-10-CM

## 2018-01-21 DIAGNOSIS — N401 Enlarged prostate with lower urinary tract symptoms: Secondary | ICD-10-CM | POA: Diagnosis not present

## 2018-01-21 DIAGNOSIS — R3915 Urgency of urination: Secondary | ICD-10-CM

## 2018-01-21 MED ORDER — TAMSULOSIN HCL 0.4 MG PO CAPS: 0.4000 mg | ORAL_CAPSULE | Freq: Every day | ORAL | 4 refills | Status: DC

## 2018-01-21 MED ORDER — FINASTERIDE 5 MG PO TABS: 5.0000 mg | ORAL_TABLET | Freq: Every day | ORAL | 11 refills | Status: DC

## 2018-01-21 NOTE — Progress Notes (Signed)
01/21/18  Chief Complaint  Patient presents with  . TRUS     HPI: 85 year old male with refractory urinary symptoms related to BPH who presents today to the office for prostate sizing.   Please see previous notes for details.     Blood pressure (!) 158/75, pulse 97, height 5\' 6"  (1.676 m), weight 184 lb (83.5 kg). NED. A&Ox3.   No respiratory distress   Abd soft, NT, ND Normal phallus with bilateral descended testicles    Prostate transrectal ultrasound sizing   Informed consent was obtained after discussing risks/benefits of the procedure.  A time out was performed to ensure correct patient identity.   Pre-Procedure: -Transrectal probe was placed without difficulty -Transrectal Ultrasound performed revealing a 44.4 gm prostate measuring 4.98 x 3.42 x 44.4 cm (length) -No significant hypoechoic or median lobe noted     Assessment/ Plan:  85 year old male with primary nighttime symptoms now status post cystoscopy and prostate sizing for poorly controlled symptoms.  On further discussion today which was lengthy, the patient reports that if he is compliant with his CPAP, avoid drinking before bedtime, and eats healthy, he has no significant urinary tract symptoms on his current medical management.  When he behaves poorly with dietary indiscretion, he has significant more issues.  Open and honest conversation about goals of surgery were reviewed.  From an anatomic standpoint, would consider either TURP versus UroLift for obstruction.  That being said, his symptoms are also well controlled with dietary and behavioral modification.  As such, I recommended that he implement behavioral change in the time being and reassess whether or not he really wants have surgery.  He is agreeable this plan.  1. Urgency of urination - tamsulosin (FLOMAX) 0.4 MG CAPS capsule; Take 1 capsule (0.4 mg total) by mouth daily.  Dispense: 90 capsule; Refill: 4  2. BPH with obstruction/lower urinary tract  symptoms  3. Nocturia  Return in about 3 months (around 04/22/2018) for IPSS/ PVR.   Hollice Espy, MD

## 2018-01-30 ENCOUNTER — Other Ambulatory Visit: Payer: Self-pay | Admitting: Urology

## 2018-01-30 DIAGNOSIS — R3915 Urgency of urination: Secondary | ICD-10-CM

## 2018-01-30 MED ORDER — FINASTERIDE 5 MG PO TABS: 5.0000 mg | ORAL_TABLET | Freq: Every day | ORAL | 3 refills | Status: AC

## 2018-01-30 MED ORDER — TAMSULOSIN HCL 0.4 MG PO CAPS: 0.4000 mg | ORAL_CAPSULE | Freq: Every day | ORAL | 3 refills | Status: DC

## 2018-02-28 ENCOUNTER — Ambulatory Visit
Admission: EM | Admit: 2018-02-28 | Discharge: 2018-02-28 | Disposition: A | Discharge status: Home / Self Care | Payer: Medicare Other | Attending: Emergency Medicine | Admitting: Emergency Medicine

## 2018-02-28 ENCOUNTER — Encounter: Payer: Self-pay | Admitting: Gynecology

## 2018-02-28 ENCOUNTER — Ambulatory Visit (INDEPENDENT_AMBULATORY_CARE_PROVIDER_SITE_OTHER): Payer: Medicare Other

## 2018-02-28 ENCOUNTER — Other Ambulatory Visit: Payer: Self-pay

## 2018-02-28 DIAGNOSIS — M545 Low back pain, unspecified: Secondary | ICD-10-CM

## 2018-02-28 DIAGNOSIS — I10 Essential (primary) hypertension: Secondary | ICD-10-CM

## 2018-02-28 MED ORDER — TIZANIDINE HCL 2 MG PO TABS: 2.0000 mg | ORAL_TABLET | Freq: Three times a day (TID) | ORAL | 0 refills | Status: DC | PRN

## 2018-02-28 NOTE — ED Provider Notes (Signed)
HPI  SUBJECTIVE:  Tommy Heath is a 85 y.o. male who presents with 3 days of right-sided low back pain described as dull, achy, and shoots into his right hip.  It does not radiate down his leg.  He reports right leg weakness secondary to pain.  He denies true weakness.  He states the pain is intermittent, present only with movement.  It is Not present at rest.  He just got a stationary bike and is riding it frequently.  No trauma, fall.  He has tried heat, ibuprofen 200 mg.  Symptoms are better with sitting down, standing up straight and ibuprofen.  Symptoms are worse with walking, and going from sitting to standing. denies N/V, fevers, flank pain, abdominal pain, urinary urgency, frequency, dysuria, cloudy or odorous urine, hematuria.  No syncope. No saddle anesthesia,  No numbness, bilateral radicular leg pain/weakness, fevers/night sweats, recent h/o trauma,  bladder/ bowel incontinence, urinary retention, h/o CA / multiple myleoma, unexplained weight loss, pain worse at night,  h/o prolonged steroid use, h/o osteopenia, h/o IVDU, h/o HIV,known AAA. no h/o pyelonephritis, nephrolithiasis. States feels different to previous episodes of back pain- usually pain is in the middle. However did have episode of right sided back pain several months ago occurred when he was bent over tying shoe.   Past medical history of hypertension, did not take his blood pressure medicines this morning.  Also diabetes, anemia, renal insufficiency.  PMD: At the [place].    Past Medical History:  Diagnosis Date  . Allergic rhinitis   . Anemia   . AVM (arteriovenous malformation) of colon   . BPH (benign prostatic hyperplasia)   . Cataract cortical, senile   . Colon polyps 02/2005, 2012   TUBULAR ADENOMA  . Diabetes mellitus type 2, uncomplicated ([place])   . Duodenal ulcer    w/ gastric ulcer  . Glaucoma   . H. pylori infection   . H. pylori infection   . History of blood product transfusion   . History of blood transfusion  02/2005   hgb was 6  . History of duodenal ulcer 02/2005   The lesion was 5 mm in largest dimension.  . Hyperlipidemia   . Hypertension   . Sleep apnea    BiPap    Past Surgical History:  Procedure Laterality Date  . COLONOSCOPY     07/2010, 09/09/2002, 10/15/2002, 02/16/2005, Adenomatous polyps, repeat 5 years, Dr Vira Agar  . COLONOSCOPY WITH PROPOFOL N/A 03/01/2015   Procedure: COLONOSCOPY WITH PROPOFOL;  Surgeon: Manya Silvas, MD;  Location: [facility] ENDOSCOPY;  Service: Endoscopy;  Laterality: N/A;  . ESOPHAGOGASTRODUODENOSCOPY     08/17/2010, 02/16/2005, no repeat per RTE  . ESOPHAGOGASTRODUODENOSCOPY (EGD) WITH PROPOFOL N/A 03/01/2015   Procedure: ESOPHAGOGASTRODUODENOSCOPY (EGD) WITH PROPOFOL;  Surgeon: Manya Silvas, MD;  Location: [facility] ENDOSCOPY;  Service: Endoscopy;  Laterality: N/A;  . ESOPHAGOGASTRODUODENOSCOPY (EGD) WITH PROPOFOL N/A 05/15/2016   Procedure: ESOPHAGOGASTRODUODENOSCOPY (EGD) WITH PROPOFOL;  Surgeon: Manya Silvas, MD;  Location[facility] ENDOSCOPY;  Service: Endoscopy;  Laterality: N/A;    Family History  Problem Relation Age of Onset  . Diabetes Mother   . Breast cancer Sister   . Prostate cancer Neg Hx   . Bladder Cancer Neg Hx   . Kidney cancer Neg Hx     Social History   Tobacco Use  . Smoking status: Former Smoker    Packs/day: 1.00    Years: 40.00    Pack years: 40.00    Types: Cigarettes  Last attempt to quit: 01/07/1993    Years since quitting: 25.1  . Smokeless tobacco: Never Used  Substance Use Topics  . Alcohol use: No    Alcohol/week: 0.0 standard drinks  . Drug use: No    No current facility-administered medications for this encounter.   Current Outpatient Medications:  .  amLODipine (NORVASC) 10 MG tablet, Reported on 03/21/2015, Disp: , Rfl:  .  Cyanocobalamin (RA VITAMIN B-12 TR) 1000 MCG TBCR, Take by mouth., Disp: , Rfl:  .  finasteride (PROSCAR) 5 MG tablet, Take 1 tablet (5 mg total) by mouth daily., Disp: 90 tablet, Rfl: 3 .   metFORMIN (GLUCOPHAGE) 1000 MG tablet, Take 1,000 mg by mouth 2 (two) times daily with a meal. , Disp: , Rfl:  .  omeprazole (PRILOSEC) 20 MG capsule, TAKE 1 CAPSULE DAILY (CALL FOR APPOINTMENT), Disp: , Rfl:  .  pioglitazone (ACTOS) 15 MG tablet, TAKE 1 TABLET DAILY, Disp: , Rfl:  .  ramipril (ALTACE) 10 MG capsule, TAKE 1 CAPSULE DAILY, Disp: , Rfl:  .  sitaGLIPtin (JANUVIA) 100 MG tablet, Take 100 mg by mouth daily. , Disp: , Rfl:  .  ferrous sulfate 325 (65 FE) MG tablet, Take by mouth 2 (two) times daily with a meal. , Disp: , Rfl:  .  glimepiride (AMARYL) 2 MG tablet, TAKE 1 TABLET DAILY WITH BREAKFAST, Disp: , Rfl:  .  tiZANidine (ZANAFLEX) 2 MG tablet, Take 1 tablet (2 mg total) by mouth every 8 (eight) hours as needed for muscle spasms., Disp: 30 tablet, Rfl: 0  Facility-Administered Medications Ordered in Other Encounters:  .  iron sucrose (VENOFER) injection 200 mg, 200 mg, Intravenous, Once, Charlaine Dalton R, MD  No Known Allergies   ROS  As noted in HPI.   Physical Exam  BP (!) 175/76 (BP Location: Left Arm)   Pulse 60   Temp 98.2 F (36.8 C) (Oral)   Resp 16   Ht 5\' 7"  (1.702 m)   Wt 83 kg   SpO2 100%   BMI 28.66 kg/m   Constitutional: Well developed, well nourished, no acute distress Eyes:  EOMI, conjunctiva normal bilaterally HENT: Normocephalic, atraumatic,mucus membranes moist Respiratory: Normal inspiratory effort Cardiovascular: Normal rate GI: nondistended. No suprapubic or flank tenderness skin: No rash, skin intact Musculoskeletal: no CVAT. - paralumbar tenderness, - muscle spasm.  Patient indicates right QL as location of pain.  Mild tenderness here.  No L-spine, SI joint, sacral tenderness.   Bilateral lower extremities nontender, baseline ROM with intact DP pulses.  Right-sided back pain aggravated with left hip flexion against resistance.  No pain with int/ext rotation, extension hips bilaterally. SLR neg bilaterally. Sensation baseline light  touch bilaterally for Pt, DTR's symmetric and intact bilaterally KJ, Motor symmetric bilateral 5/5 hip flexion, quadriceps, hamstrings, EHL, foot dorsiflexion, foot plantarflexion, gait somewhat antalgic but without apparent new ataxia.  R Hip: No gluteal tenderness.  No tenderness over the lateral hip, down IT band, quadriceps.  No tenderness at the sciatic notch.  No pain with full range of motion and active flexion extension of the right hip.  Neurologic: Alert & oriented x 3, no focal neuro deficits Psychiatric: Speech and behavior appropriate   ED Course   Medications - No data to display  Orders Placed This Encounter  Procedures  . DG Lumbar Spine Complete    Standing Status:   Standing    Number of Occurrences:   1    Order Specific Question:   Reason for  Exam (SYMPTOM  OR DIAGNOSIS REQUIRED)    Answer:   R sided LBP    No results found for this or any previous visit (from the past 24 hour(s)). Dg Lumbar Spine Complete  Result Date: 02/28/2018 CLINICAL DATA:  Back pain for 2 days without trauma. EXAM: LUMBAR SPINE - COMPLETE 4+ VIEW COMPARISON:  None. FINDINGS: Five lumbar type vertebral bodies. Sacroiliac joints are symmetric. Aortic atherosclerosis. Loss of intervertebral disc height at the lumbosacral junction with trace retrolisthesis of L5 on S1. IMPRESSION: 1. Lumbosacral spondylosis, without acute osseous abnormality. 2. Aortic atherosclerosis. Electronically Signed   By: Abigail Miyamoto M.D.   On: 02/28/2018 11:13    ED Clinical Impression  Acute right-sided low back pain without sciatica   ED Assessment/Plan  Pt hypertensive today. States that this is within normal limits for him.  Has not taken BP meds today.  Patient is otherwise asymptomatic. Pt denies any CNS type sx such as HA, visual changes, focal paresis, or new onset seizure activity. Pt denies any CV sx such as CP, dyspnea, palpitations, pedal edema, tearing pain radiating to back or abd. Pt denied any renal  sx such as anuria or hematuria.    will x-ray L-spine due to age.  Previous labs reviewed.  Patient has a history of renal insufficiency.  Last BUN/creatinine 23/1.74 in January 2020  Reviewed imaging independently.  Lumbosacral spondylolysis at L5/S1 without acute osseous abnormalities.  Aortic atherosclerosis.  See radiology report for details.  Pt ambulatory in the UC.  Discussed renal insufficiency with patient and advised him to not take the ibuprofen.  This appears to be very musculoskeletal.  He may take 1 g of Tylenol 3-4 times a day as needed for pain. Pt to f/u with PMD.  Discussed labs, imaging, medical decision-making, and plan for follow-up with the patient.  Discussed signs and symptoms that should prompt return to the emergency department.  Patient agrees with plan.   Meds ordered this encounter  Medications  . tiZANidine (ZANAFLEX) 2 MG tablet    Sig: Take 1 tablet (2 mg total) by mouth every 8 (eight) hours as needed for muscle spasms.    Dispense:  30 tablet    Refill:  0    *This clinic note was created using [employer]. Therefore, there may be occasional mistakes despite careful proofreading.  ?    Melynda Ripple, MD 02/28/18 1820

## 2018-02-28 NOTE — Discharge Instructions (Addendum)
1 g of Tylenol 3 or 4 times a day as needed for pain.  I would stop the ibuprofen due to your kidneys. Try icy hot or other topical creams.  Zanaflex will help with any muscle spasms.  Very careful with this as this can make you sleepy and increase your risk of falls.

## 2018-02-28 NOTE — ED Triage Notes (Signed)
Per patient when walking pain at right  hip/back pain. Per patient pain only with movement x 3  Days. Per patient has not taken his blood pressure medication today.

## 2018-04-21 ENCOUNTER — Telehealth: Payer: Self-pay | Admitting: Urology

## 2018-04-21 ENCOUNTER — Telehealth: Payer: Medicare Other | Admitting: Urology

## 2018-04-21 ENCOUNTER — Other Ambulatory Visit: Payer: Self-pay

## 2018-04-21 NOTE — Telephone Encounter (Signed)
I was unable to reach the patient today for virtual visit both by telephone as well as [email or web address] text message invite.  Please cancel his virtual visit today.  He likely would be better served by being seen in the office in person for IPSS/PVR which could be pushed out for 3 months.  Please call and arrange her mail letter.  Hollice Espy, MD

## 2018-04-22 ENCOUNTER — Ambulatory Visit: Payer: Medicare Other | Admitting: Urology

## 2018-07-20 ENCOUNTER — Telehealth: Payer: Self-pay | Admitting: Hematology and Oncology

## 2018-07-20 ENCOUNTER — Other Ambulatory Visit: Payer: Self-pay

## 2018-07-20 ENCOUNTER — Inpatient Hospital Stay: Payer: Medicare Other | Attending: Hematology and Oncology

## 2018-07-20 DIAGNOSIS — D509 Iron deficiency anemia, unspecified: Secondary | ICD-10-CM | POA: Diagnosis present

## 2018-07-20 DIAGNOSIS — D500 Iron deficiency anemia secondary to blood loss (chronic): Secondary | ICD-10-CM

## 2018-07-20 LAB — CBC WITH DIFFERENTIAL/PLATELET
Abs Immature Granulocytes: 0.02 10*3/uL (ref 0.00–0.07)
Basophils Absolute: 0.1 10*3/uL (ref 0.0–0.1)
Basophils Relative: 1 %
Eosinophils Absolute: 0.1 10*3/uL (ref 0.0–0.5)
Eosinophils Relative: 2 %
HCT: 31.8 % — ABNORMAL LOW (ref 39.0–52.0)
Hemoglobin: 10.2 g/dL — ABNORMAL LOW (ref 13.0–17.0)
Immature Granulocytes: 0 %
Lymphocytes Relative: 20 %
Lymphs Abs: 1.1 10*3/uL (ref 0.7–4.0)
MCH: 28.4 pg (ref 26.0–34.0)
MCHC: 32.1 g/dL (ref 30.0–36.0)
MCV: 88.6 fL (ref 80.0–100.0)
Monocytes Absolute: 0.5 10*3/uL (ref 0.1–1.0)
Monocytes Relative: 9 %
Neutro Abs: 3.8 10*3/uL (ref 1.7–7.7)
Neutrophils Relative %: 68 %
Platelets: 239 10*3/uL (ref 150–400)
RBC: 3.59 MIL/uL — ABNORMAL LOW (ref 4.22–5.81)
RDW: 15.9 % — ABNORMAL HIGH (ref 11.5–15.5)
WBC: 5.6 10*3/uL (ref 4.0–10.5)
nRBC: 0 % (ref 0.0–0.2)

## 2018-07-20 LAB — BASIC METABOLIC PANEL
Anion gap: 8 (ref 5–15)
BUN: 21 mg/dL (ref 8–23)
CO2: 23 mmol/L (ref 22–32)
Calcium: 8.7 mg/dL — ABNORMAL LOW (ref 8.9–10.3)
Chloride: 108 mmol/L (ref 98–111)
Creatinine, Ser: 1.7 mg/dL — ABNORMAL HIGH (ref 0.61–1.24)
GFR calc Af Amer: 43 mL/min — ABNORMAL LOW (ref >60)
GFR calc non Af Amer: 37 mL/min — ABNORMAL LOW (ref >60)
Glucose, Bld: 155 mg/dL — ABNORMAL HIGH (ref 70–99)
Potassium: 3.3 mmol/L — ABNORMAL LOW (ref 3.5–5.1)
Sodium: 139 mmol/L (ref 135–145)

## 2018-07-20 LAB — IRON AND TIBC
Iron: 47 ug/dL (ref 45–182)
Saturation Ratios: 14 % — ABNORMAL LOW (ref 17.9–39.5)
TIBC: 346 ug/dL (ref 250–450)
UIBC: 299 ug/dL

## 2018-07-20 LAB — FERRITIN: Ferritin: 17 ng/mL — ABNORMAL LOW (ref 24–336)

## 2018-07-20 NOTE — Progress Notes (Signed)
[facility]  [address], [address] [place], [place] [address] Phone: [phone]  Fax: [phone]   Clinic Day:  07/21/2018  Referring physician: Sherrin Daisy, MD  Chief Complaint: Tommy Heath is a 85 y.o. male with iron deficiency anemia who is seen for new patient assessment.  HPI: The patient was diagnosed with iron deficiency anemia around 2007.  Notes indicate a history of upper GI bleed secondary to gastric ulcer and duodenal AVM in 2007 and 2012.  He received 3 units of PRBCs in 2007 for a hemoglobin of 6.  The patient was initially seen by Dr Rogue Bussing on 01/17/2015.  He underwent a work-up: CBC revealed a hematocrit of 28.4, hemoglobin 8.8, MCV 74.6, platelets 314,000, white count 6500.  Creatinine was 1.19.  Ferritin was 7 with an iron saturation of 6% and a TIBC of 449.  B12 was 574.  Folate was 6.1%.  Retic was 1.7%.  Haptoglobin was 226 (normal).  LDH was 101. SPEP revealed no monoclonal protein.  Kappa and lambda free light chain ratio was normal.  EGD on 03/01/2015 by Dr Vira Agar revealed gastritis and a few nonbleeding angiectasias in the duodenum treated by argon plasma coagulation (APC).  Colonoscopy on 03/01/2015 revealed one diminutive polyp in the descending colon (tubular adenoma).  There was a single non-bleeding colonic angioectasia treated with APC.  There was diverticulosis in the sigmoid colon, in the descending colon and in the transverse colon.  There were internal hemorrhoids.  He has received Venofer: weekly x 4 (01/24/2015 - 02/14/2015), x 4 (08/01/2015 - 08/23/2015), x 4 (01/23/2016 - 02/13/2016), x4 (07/23/2016 - 08/14/2016), x 5 (01/21/2017 - 02/19/2017), x2 (07/22/2017 - 07/29/2017), and 01/20/2018.  Ferritin has been followed: 7 on 01/17/2015, 60 on 03/21/2015, 14 on 07/25/2015, 16 on 01/16/2016, 23 on 07/16/2016, 9 on 01/16/2017, 19 on 07/17/2017, 12 on 01/15/2018, 17 on 07/20/2018.  The patient was last seen in the  hematology clinic by Dr. Rogue Bussing on 01/20/2018.  At that time, the patient complained of frequent urination at nighttime. He had mild to moderate fatigue.  He described black stools felt secondary to oral iron.  He denied any nausea, vomiting, and constipation. He continued on oral iron twice a day.  Hemoglobin was 10.4.  Ferritin was 12. Iron saturation was 20%.  Creatinine was 1.74.  He received Venofer.   The patient has a history of benign prostatic hyperplasia. The patient was seen by Dr Hollice Espy (last seen 01/21/2018).  Flomax was prescribed.   Labs on 07/20/2018:  Hematocrit 31.8, hemoglobin 10.2, MCV 88.6, platelets 239,000, and WBC 5,600 with an ANC 3,800.  Ferritin was 17 with an iron saturation of 14%.  Creatinine 1.70.  Symptomatically, he is doing well.  He eats iron rich foods, and consumes meats and vegetables daily. He reports diarrhea which he attributes to  Metformin. The patient has dark stools with oral iron.  He denies any hematochezia.  He notes his energy level is affected by what he eats.  He reports urinary frequency at night time. The patient denies any pica or restless legs.    Past Medical History:  Diagnosis Date  . Allergic rhinitis   . Anemia   . AVM (arteriovenous malformation) of colon   . BPH (benign prostatic hyperplasia)   . Cataract cortical, senile   . Colon polyps 02/2005, 2012   TUBULAR ADENOMA  . Diabetes mellitus type 2, uncomplicated ([place])   . Duodenal ulcer    w/ gastric ulcer  .  Glaucoma   . H. pylori infection   . H. pylori infection   . History of blood product transfusion   . History of blood transfusion 02/2005   hgb was 6  . History of duodenal ulcer 02/2005   The lesion was 5 mm in largest dimension.  . Hyperlipidemia   . Hypertension   . Sleep apnea    BiPap    Past Surgical History:  Procedure Laterality Date  . COLONOSCOPY     07/2010, 09/09/2002, 10/15/2002, 02/16/2005, Adenomatous polyps, repeat 5 years, Dr Vira Agar  .  COLONOSCOPY WITH PROPOFOL N/A 03/01/2015   Procedure: COLONOSCOPY WITH PROPOFOL;  Surgeon: Manya Silvas, MD;  Location: [facility] ENDOSCOPY;  Service: Endoscopy;  Laterality: N/A;  . ESOPHAGOGASTRODUODENOSCOPY     08/17/2010, 02/16/2005, no repeat per RTE  . ESOPHAGOGASTRODUODENOSCOPY (EGD) WITH PROPOFOL N/A 03/01/2015   Procedure: ESOPHAGOGASTRODUODENOSCOPY (EGD) WITH PROPOFOL;  Surgeon: Manya Silvas, MD;  Location: [facility] ENDOSCOPY;  Service: Endoscopy;  Laterality: N/A;  . ESOPHAGOGASTRODUODENOSCOPY (EGD) WITH PROPOFOL N/A 05/15/2016   Procedure: ESOPHAGOGASTRODUODENOSCOPY (EGD) WITH PROPOFOL;  Surgeon: Manya Silvas, MD;  Location: [facility] ENDOSCOPY;  Service: Endoscopy;  Laterality: N/A;    Family History  Problem Relation Age of Onset  . Diabetes Mother   . Breast cancer Sister   . Prostate cancer Neg Hx   . Bladder Cancer Neg Hx   . Kidney cancer Neg Hx     Social History:  reports that he quit smoking about 25 years ago. His smoking use included cigarettes. He has a 40.00 pack-year smoking history. He has never used smokeless tobacco. He reports that he does not drink alcohol or use drugs. He is retired from [employer].  He lives in [place], [place]. He denies any exposure to toxins and radiation.The patient is alone today.  Allergies: No Known Allergies  Current Medications: Current Outpatient Medications  Medication Sig Dispense Refill  . amLODipine (NORVASC) 10 MG tablet Take 10 mg by mouth daily.     . Cyanocobalamin (RA VITAMIN B-12 TR) 1000 MCG TBCR Take 1 tablet by mouth daily.     . ferrous sulfate 325 (65 FE) MG tablet Take by mouth 2 (two) times daily with a meal.     . finasteride (PROSCAR) 5 MG tablet Take 1 tablet (5 mg total) by mouth daily. 90 tablet 3  . glimepiride (AMARYL) 2 MG tablet TAKE 1 TABLET DAILY WITH BREAKFAST    . metFORMIN (GLUCOPHAGE) 1000 MG tablet Take 500 mg by mouth 2 (two) times daily with a meal.     . omeprazole (PRILOSEC) 20 MG capsule TAKE 1  CAPSULE DAILY (CALL FOR APPOINTMENT)    . pioglitazone (ACTOS) 15 MG tablet TAKE 1 TABLET DAILY    . ramipril (ALTACE) 10 MG capsule TAKE 1 CAPSULE DAILY    . sitaGLIPtin (JANUVIA) 100 MG tablet Take 100 mg by mouth daily.      No current facility-administered medications for this visit.    Facility-Administered Medications Ordered in Other Visits  Medication Dose Route Frequency Provider Last Rate Last Dose  . iron sucrose (VENOFER) injection 200 mg  200 mg Intravenous Once Cammie Sickle, MD        Review of Systems  Constitutional: Positive for malaise/fatigue (ocassoinal). Negative for chills, fever and weight loss.       Doing well.  HENT: Negative for congestion, hearing loss and sore throat.   Eyes: Negative for blurred vision.  Respiratory: Negative for cough, hemoptysis and shortness  of breath.   Cardiovascular: Negative for chest pain, palpitations and leg swelling.  Gastrointestinal: Positive for diarrhea and melena. Negative for abdominal pain, blood in stool, constipation, heartburn, nausea and vomiting.  Genitourinary: Positive for frequency (night time). Negative for dysuria, hematuria and urgency.  Musculoskeletal: Negative for back pain, joint pain, myalgias and neck pain.  Skin: Negative for rash.  Neurological: Negative for dizziness, tingling, sensory change, weakness and headaches.  Endo/Heme/Allergies: Does not bruise/bleed easily.  Psychiatric/Behavioral: Negative for depression and memory loss. The patient is not nervous/anxious and does not have insomnia.   All other systems reviewed and are negative.  Performance status (ECOG): 1  Vital Signs Blood pressure (!) 168/71, pulse (!) 54, temperature (!) 96.9 F (36.1 C), temperature source Tympanic, resp. rate 18, weight 183 lb 1.5 oz (83 kg), SpO2 100 %. Physical Exam  Constitutional: He is oriented to person, place, and time. He appears well-developed and well-nourished. No distress.  HENT:  Head:  Normocephalic and atraumatic.  Mouth/Throat: Oropharynx is clear and moist. No oropharyngeal exudate.  Black cap. Short gray hair. Mask.  Eyes: Pupils are equal, round, and reactive to light. Conjunctivae and EOM are normal. No scleral icterus.  Brown eyes.  Neck: Normal range of motion. Neck supple.  Cardiovascular: Normal rate, regular rhythm and normal heart sounds.  No murmur heard. Pulmonary/Chest: Effort normal and breath sounds normal. No respiratory distress.  Abdominal: Soft. Bowel sounds are normal. There is no abdominal tenderness.  Musculoskeletal: Normal range of motion.        General: Edema (ankles) present. No tenderness.  Lymphadenopathy:    He has no cervical adenopathy.    He has no axillary adenopathy.       Right: No supraclavicular adenopathy present.       Left: No supraclavicular adenopathy present.  Neurological: He is alert and oriented to person, place, and time. He has normal reflexes.  Skin: Skin is warm and dry. He is not diaphoretic.  Psychiatric: He has a normal mood and affect. His behavior is normal. Judgment and thought content normal.  Nursing note and vitals reviewed.   Appointment on 07/20/2018  Component Date Value Ref Range Status  . Iron 07/20/2018 47  45 - 182 ug/dL Final  . TIBC 07/20/2018 346  250 - 450 ug/dL Final  . Saturation Ratios 07/20/2018 14* 17.9 - 39.5 % Final  . UIBC 07/20/2018 299  ug/dL Final   Performed at [facility], [address], [place], [place] [address]  . Ferritin 07/20/2018 17* 24 - 336 ng/mL Final   Performed at [facility], [address] [place], [place] [address]  . Sodium 07/20/2018 139  135 - 145 mmol/L Final  . Potassium 07/20/2018 3.3* 3.5 - 5.1 mmol/L Final  . Chloride 07/20/2018 108  98 - 111 mmol/L Final  . CO2 07/20/2018 23  22 - 32 mmol/L Final  . Glucose, Bld 07/20/2018 155* 70 - 99 mg/dL Final  . BUN 07/20/2018 21  8 - 23 mg/dL Final  . Creatinine, Ser 07/20/2018 1.70* 0.61  - 1.24 mg/dL Final  . Calcium 07/20/2018 8.7* 8.9 - 10.3 mg/dL Final  . GFR calc non Af Amer 07/20/2018 37* >60 mL/min Final  . GFR calc Af Amer 07/20/2018 43* >60 mL/min Final  . Anion gap 07/20/2018 8  5 - 15 Final   Performed at [facility], [address] [place],  [address]  . WBC 07/20/2018 5.6  4.0 - 10.5 K/uL Final  .  RBC 07/20/2018 3.59* 4.22 - 5.81 MIL/uL Final  . Hemoglobin 07/20/2018 10.2* 13.0 - 17.0 g/dL Final  . HCT 07/20/2018 31.8* 39.0 - 52.0 % Final  . MCV 07/20/2018 88.6  80.0 - 100.0 fL Final  . MCH 07/20/2018 28.4  26.0 - 34.0 pg Final  . MCHC 07/20/2018 32.1  30.0 - 36.0 g/dL Final  . RDW 07/20/2018 15.9* 11.5 - 15.5 % Final  . Platelets 07/20/2018 239  150 - 400 K/uL Final  . nRBC 07/20/2018 0.0  0.0 - 0.2 % Final  . Neutrophils Relative % 07/20/2018 68  % Final  . Neutro Abs 07/20/2018 3.8  1.7 - 7.7 K/uL Final  . Lymphocytes Relative 07/20/2018 20  % Final  . Lymphs Abs 07/20/2018 1.1  0.7 - 4.0 K/uL Final  . Monocytes Relative 07/20/2018 9  % Final  . Monocytes Absolute 07/20/2018 0.5  0.1 - 1.0 K/uL Final  . Eosinophils Relative 07/20/2018 2  % Final  . Eosinophils Absolute 07/20/2018 0.1  0.0 - 0.5 K/uL Final  . Basophils Relative 07/20/2018 1  % Final  . Basophils Absolute 07/20/2018 0.1  0.0 - 0.1 K/uL Final  . Immature Granulocytes 07/20/2018 0  % Final  . Abs Immature Granulocytes 07/20/2018 0.02  0.00 - 0.07 K/uL Final   Performed at [facility], [address], [place], [place] [address]    Assessment:  Tommy Heath is a 85 y.o. male with iron deficiency anemia.   He was noted to have a history of upper GI bleed secondary to gastric ulcer and duodenal AVM in 2007 and 2012.  He received PRBCs in 2007 for a hemoglobin of 6.  Diet appears good.  Work-upon 01/17/2015 revealed a hematocrit of 28.4, hemoglobin 8.8, MCV 74.6, platelets 314,000, white count 6500.  Ferritin was 7 with an iron saturation of 6% and a TIBC of  449.  Normal labs included:  B12, folate, haptoglobin, LDH, SPEP and free light chain ratio.    EGD on 03/01/2015 revealed gastritis and a few nonbleeding angiectasias in the duodenum treated by argon plasma coagulation (APC).  Colonoscopy on 03/01/2015 revealed one diminutive polyp in the descending colon (tubular adenoma).  There was a single non-bleeding colonic angioectasia treated with APC.  There was diverticulosis in the sigmoid colon, in the descending colon and in the transverse colon.  There were internal hemorrhoids.  He has received Venofer: weekly x 4 (01/24/2015 - 02/14/2015), x 4 (08/01/2015 - 08/23/2015), x 4 (01/23/2016 - 02/13/2016), x4 (07/23/2016 - 08/14/2016), x 5 (01/21/2017 - 02/19/2017), x2 (07/22/2017 - 07/29/2017), and 01/20/2018.  Ferritin has been followed: 7 on 01/17/2015, 60 on 03/21/2015, 14 on 07/25/2015, 16 on 01/16/2016, 23 on 07/16/2016, 9 on 01/16/2017, 19 on 07/17/2017, 12 on 01/15/2018, 17 on 07/20/2018.  He has stage III chronic kidney disease.  Creatinine was 1.70 on 07/20/2018.  Symptomatically, he feels "not good, not bad".  Exam is unremarkable.  Hemoglobin is 10.2.  Ferritin is 17.  Plan: 1.   Review labs from 07/20/2018. 2.   Iron deficiency anemia  Review entire medical history, diagnosis and management of iron deficiency anemia.   Patient has received PRBCs in the past hor GI bleeding.  He has had a gastric ulcer/AVM and angioectasias treated with APC.  Diet appears good.  Hematocrit 31.8.  Hemoglobin 10.2.  MCV 88.6.  Ferritin is 17 with an iron saturation of 14% (low).  Venofer for today and weekly x3.  Discuss consideration of capsule study.  Follow-up  with gastroenterology. 3.   RTC in 3 months for labs (CBC with diff, ferritin iron studies). 4.   RTC in 6 months for MD assessment, labs (CBC with diff, BMP, ferritin, iron studies-day before), and +/- Venofer.  I discussed the assessment and treatment plan with the patient.  The patient was  provided an opportunity to ask questions and all were answered.  The patient agreed with the plan and demonstrated an understanding of the instructions.  The patient was advised to call back if the symptoms worsen or if the condition fails to improve as anticipated.   Melissa C. Mike Gip, MD, PhD    07/21/2018, 9:35 AM  I, Selena Batten, am acting as scribe for [employer]. Mike Gip, MD, PhD.  I, Melissa C. Mike Gip, MD, have reviewed the above documentation for accuracy and completeness, and I agree with the above.

## 2018-07-20 NOTE — Telephone Encounter (Signed)
Spoke with pt to confirm appt date/time, do pre-appt screen which was completed, and adv of Covid-19 guidelines for appt regarding screening questions, temperature check, face mask required, and no visitors allowed °

## 2018-07-21 ENCOUNTER — Inpatient Hospital Stay (HOSPITAL_BASED_OUTPATIENT_CLINIC_OR_DEPARTMENT_OTHER): Payer: Medicare Other | Admitting: Hematology and Oncology

## 2018-07-21 ENCOUNTER — Ambulatory Visit: Payer: Medicare Other | Admitting: Hematology and Oncology

## 2018-07-21 ENCOUNTER — Inpatient Hospital Stay: Payer: Medicare Other

## 2018-07-21 ENCOUNTER — Ambulatory Visit: Payer: Medicare Other

## 2018-07-21 ENCOUNTER — Encounter: Payer: Self-pay | Admitting: Hematology and Oncology

## 2018-07-21 VITALS — BP 168/71 | HR 54 | Temp 96.9°F | Resp 18 | Wt 183.1 lb

## 2018-07-21 VITALS — BP 156/68 | HR 66 | Temp 96.0°F | Resp 18

## 2018-07-21 DIAGNOSIS — D500 Iron deficiency anemia secondary to blood loss (chronic): Secondary | ICD-10-CM

## 2018-07-21 DIAGNOSIS — D509 Iron deficiency anemia, unspecified: Secondary | ICD-10-CM | POA: Diagnosis not present

## 2018-07-21 DIAGNOSIS — Z87891 Personal history of nicotine dependence: Secondary | ICD-10-CM

## 2018-07-21 DIAGNOSIS — N183 Chronic kidney disease, stage 3 (moderate): Secondary | ICD-10-CM | POA: Diagnosis not present

## 2018-07-21 MED ORDER — SODIUM CHLORIDE 0.9 % IV SOLN
Freq: Once | INTRAVENOUS | Status: AC
  Administered 2018-07-21: 10:00:00 via INTRAVENOUS
  Filled 2018-07-21: qty 250

## 2018-07-21 MED ORDER — IRON SUCROSE 20 MG/ML IV SOLN
200.0000 mg | Freq: Once | INTRAVENOUS | Status: AC
  Administered 2018-07-21: 200 mg via INTRAVENOUS
  Filled 2018-07-21: qty 10

## 2018-07-21 MED ORDER — SODIUM CHLORIDE 0.9 % IV SOLN: 200.0000 mg | Freq: Once | INTRAVENOUS | Status: DC

## 2018-07-21 NOTE — Progress Notes (Signed)
Pt here for follow up. Previous Dr. Brahmanday patient. Denies any concerns.  

## 2018-07-22 ENCOUNTER — Ambulatory Visit: Payer: Medicare Other | Admitting: Urology

## 2018-07-28 ENCOUNTER — Other Ambulatory Visit: Payer: Self-pay

## 2018-07-28 ENCOUNTER — Inpatient Hospital Stay: Payer: Medicare Other

## 2018-07-28 VITALS — BP 133/59 | HR 53 | Temp 96.9°F | Resp 18

## 2018-07-28 DIAGNOSIS — D500 Iron deficiency anemia secondary to blood loss (chronic): Secondary | ICD-10-CM

## 2018-07-28 DIAGNOSIS — D509 Iron deficiency anemia, unspecified: Secondary | ICD-10-CM | POA: Diagnosis not present

## 2018-07-28 MED ORDER — IRON SUCROSE 20 MG/ML IV SOLN
200.0000 mg | Freq: Once | INTRAVENOUS | Status: AC
  Administered 2018-07-28: 14:00:00 200 mg via INTRAVENOUS

## 2018-07-28 MED ORDER — SODIUM CHLORIDE 0.9 % IV SOLN: 200.0000 mg | Freq: Once | INTRAVENOUS | Status: DC

## 2018-07-28 MED ORDER — SODIUM CHLORIDE 0.9 % IV SOLN
Freq: Once | INTRAVENOUS | Status: AC
  Administered 2018-07-28: 14:00:00 via INTRAVENOUS
  Filled 2018-07-28: qty 250

## 2018-07-28 NOTE — Patient Instructions (Signed)

## 2018-07-29 ENCOUNTER — Ambulatory Visit: Payer: Medicare Other | Admitting: Urology

## 2018-08-04 ENCOUNTER — Inpatient Hospital Stay: Payer: Medicare Other

## 2018-08-04 ENCOUNTER — Other Ambulatory Visit: Payer: Self-pay

## 2018-08-04 VITALS — BP 118/60 | HR 58 | Temp 97.6°F | Resp 18

## 2018-08-04 DIAGNOSIS — D500 Iron deficiency anemia secondary to blood loss (chronic): Secondary | ICD-10-CM

## 2018-08-04 DIAGNOSIS — D509 Iron deficiency anemia, unspecified: Secondary | ICD-10-CM | POA: Diagnosis not present

## 2018-08-04 MED ORDER — SODIUM CHLORIDE 0.9 % IV SOLN: 200.0000 mg | Freq: Once | INTRAVENOUS | Status: DC

## 2018-08-04 MED ORDER — SODIUM CHLORIDE 0.9 % IV SOLN
Freq: Once | INTRAVENOUS | Status: AC
  Administered 2018-08-04: 14:00:00 via INTRAVENOUS
  Filled 2018-08-04: qty 250

## 2018-08-04 MED ORDER — IRON SUCROSE 20 MG/ML IV SOLN
200.0000 mg | Freq: Once | INTRAVENOUS | Status: AC
  Administered 2018-08-04: 14:00:00 200 mg via INTRAVENOUS
  Filled 2018-08-04: qty 10

## 2018-08-11 ENCOUNTER — Inpatient Hospital Stay: Payer: Medicare Other | Attending: Hematology and Oncology

## 2018-08-11 ENCOUNTER — Other Ambulatory Visit: Payer: Self-pay

## 2018-08-11 VITALS — BP 118/74 | HR 74 | Temp 97.2°F | Resp 18

## 2018-08-11 DIAGNOSIS — D508 Other iron deficiency anemias: Secondary | ICD-10-CM | POA: Diagnosis not present

## 2018-08-11 DIAGNOSIS — D500 Iron deficiency anemia secondary to blood loss (chronic): Secondary | ICD-10-CM

## 2018-08-11 MED ORDER — SODIUM CHLORIDE 0.9 % IV SOLN: 200.0000 mg | Freq: Once | INTRAVENOUS | Status: DC

## 2018-08-11 MED ORDER — IRON SUCROSE 20 MG/ML IV SOLN
200.0000 mg | Freq: Once | INTRAVENOUS | Status: AC
  Administered 2018-08-11: 14:00:00 200 mg via INTRAVENOUS
  Filled 2018-08-11: qty 10

## 2018-08-11 MED ORDER — SODIUM CHLORIDE 0.9 % IV SOLN
Freq: Once | INTRAVENOUS | Status: AC
  Administered 2018-08-11: 14:00:00 via INTRAVENOUS
  Filled 2018-08-11: qty 250

## 2018-08-11 NOTE — Patient Instructions (Signed)

## 2018-09-09 ENCOUNTER — Encounter: Payer: Self-pay | Admitting: Urology

## 2018-09-09 ENCOUNTER — Ambulatory Visit: Payer: Medicare Other | Admitting: Urology

## 2018-10-20 ENCOUNTER — Inpatient Hospital Stay: Payer: Medicare Other | Attending: Hematology and Oncology

## 2018-10-26 ENCOUNTER — Inpatient Hospital Stay: Payer: Medicare Other | Attending: Hematology and Oncology

## 2018-10-26 ENCOUNTER — Other Ambulatory Visit: Payer: Self-pay

## 2018-10-26 DIAGNOSIS — D500 Iron deficiency anemia secondary to blood loss (chronic): Secondary | ICD-10-CM | POA: Diagnosis present

## 2018-10-26 LAB — CBC WITH DIFFERENTIAL/PLATELET
Abs Immature Granulocytes: 0.04 10*3/uL (ref 0.00–0.07)
Basophils Absolute: 0.1 10*3/uL (ref 0.0–0.1)
Basophils Relative: 1 %
Eosinophils Absolute: 0.1 10*3/uL (ref 0.0–0.5)
Eosinophils Relative: 2 %
HCT: 34.8 % — ABNORMAL LOW (ref 39.0–52.0)
Hemoglobin: 11.2 g/dL — ABNORMAL LOW (ref 13.0–17.0)
Immature Granulocytes: 1 %
Lymphocytes Relative: 23 %
Lymphs Abs: 1.4 10*3/uL (ref 0.7–4.0)
MCH: 28.6 pg (ref 26.0–34.0)
MCHC: 32.2 g/dL (ref 30.0–36.0)
MCV: 89 fL (ref 80.0–100.0)
Monocytes Absolute: 0.6 10*3/uL (ref 0.1–1.0)
Monocytes Relative: 9 %
Neutro Abs: 4 10*3/uL (ref 1.7–7.7)
Neutrophils Relative %: 64 %
Platelets: 251 10*3/uL (ref 150–400)
RBC: 3.91 MIL/uL — ABNORMAL LOW (ref 4.22–5.81)
RDW: 15.6 % — ABNORMAL HIGH (ref 11.5–15.5)
WBC: 6.1 10*3/uL (ref 4.0–10.5)
nRBC: 0 % (ref 0.0–0.2)

## 2018-10-26 LAB — IRON AND TIBC
Iron: 63 ug/dL (ref 45–182)
Saturation Ratios: 20 % (ref 17.9–39.5)
TIBC: 318 ug/dL (ref 250–450)
UIBC: 255 ug/dL

## 2018-10-26 LAB — FERRITIN: Ferritin: 64 ng/mL (ref 24–336)

## 2018-10-29 ENCOUNTER — Telehealth: Payer: Self-pay

## 2018-10-29 NOTE — Telephone Encounter (Signed)
-----   Message from Secundino Ginger sent at 10/29/2018 10:03 AM EDT ----- Regarding: lab results Tommy Heath  had labs this week and said someone usually calls him. He was wanting to know his results.

## 2018-10-29 NOTE — Telephone Encounter (Signed)
Attempted to contact patient to inform of recent results per patient's request. VM left requesting callback.

## 2018-10-30 ENCOUNTER — Telehealth: Payer: Self-pay

## 2018-10-30 NOTE — Telephone Encounter (Signed)
spoke with the patient to inform him of his lab results. The patient states he is feeling fine and if he feel that his levels are dropping what should he do. I have informed the patient to call the office and speak with Loma Sousa or Cealsea and we will speak with dr Mike Gip and see if she would like for him to come in for labs and recheck his levels. The patient was understanding and agreeable.

## 2018-11-16 ENCOUNTER — Other Ambulatory Visit: Payer: Self-pay

## 2018-11-16 ENCOUNTER — Inpatient Hospital Stay: Payer: Medicare Other | Attending: Hematology and Oncology

## 2018-11-16 DIAGNOSIS — D500 Iron deficiency anemia secondary to blood loss (chronic): Secondary | ICD-10-CM

## 2018-11-16 LAB — CBC WITH DIFFERENTIAL/PLATELET
Abs Immature Granulocytes: 0.03 10*3/uL (ref 0.00–0.07)
Basophils Absolute: 0.1 10*3/uL (ref 0.0–0.1)
Basophils Relative: 1 %
Eosinophils Absolute: 0.1 10*3/uL (ref 0.0–0.5)
Eosinophils Relative: 3 %
HCT: 33.4 % — ABNORMAL LOW (ref 39.0–52.0)
Hemoglobin: 10.8 g/dL — ABNORMAL LOW (ref 13.0–17.0)
Immature Granulocytes: 1 %
Lymphocytes Relative: 20 %
Lymphs Abs: 1.1 10*3/uL (ref 0.7–4.0)
MCH: 29 pg (ref 26.0–34.0)
MCHC: 32.3 g/dL (ref 30.0–36.0)
MCV: 89.5 fL (ref 80.0–100.0)
Monocytes Absolute: 0.5 10*3/uL (ref 0.1–1.0)
Monocytes Relative: 10 %
Neutro Abs: 3.7 10*3/uL (ref 1.7–7.7)
Neutrophils Relative %: 65 %
Platelets: 237 10*3/uL (ref 150–400)
RBC: 3.73 MIL/uL — ABNORMAL LOW (ref 4.22–5.81)
RDW: 15.2 % (ref 11.5–15.5)
WBC: 5.5 10*3/uL (ref 4.0–10.5)
nRBC: 0 % (ref 0.0–0.2)

## 2018-11-16 LAB — FERRITIN: Ferritin: 56 ng/mL (ref 24–336)

## 2018-11-29 ENCOUNTER — Emergency Department: Payer: Medicare Other

## 2018-11-29 ENCOUNTER — Encounter: Payer: Self-pay | Admitting: Emergency Medicine

## 2018-11-29 ENCOUNTER — Other Ambulatory Visit: Payer: Self-pay

## 2018-11-29 ENCOUNTER — Emergency Department
Admission: EM | Admit: 2018-11-29 | Discharge: 2018-11-29 | Disposition: A | Discharge status: Other Acute Inpatient Hospital | Payer: Medicare Other | Attending: Student in an Organized Health Care Education/Training Program | Admitting: Student in an Organized Health Care Education/Training Program

## 2018-11-29 DIAGNOSIS — Z79899 Other long term (current) drug therapy: Secondary | ICD-10-CM | POA: Insufficient documentation

## 2018-11-29 DIAGNOSIS — E119 Type 2 diabetes mellitus without complications: Secondary | ICD-10-CM | POA: Insufficient documentation

## 2018-11-29 DIAGNOSIS — I6200 Nontraumatic subdural hemorrhage, unspecified: Secondary | ICD-10-CM | POA: Diagnosis not present

## 2018-11-29 DIAGNOSIS — Z7984 Long term (current) use of oral hypoglycemic drugs: Secondary | ICD-10-CM | POA: Diagnosis not present

## 2018-11-29 DIAGNOSIS — Z87891 Personal history of nicotine dependence: Secondary | ICD-10-CM | POA: Diagnosis not present

## 2018-11-29 DIAGNOSIS — R531 Weakness: Secondary | ICD-10-CM | POA: Diagnosis present

## 2018-11-29 DIAGNOSIS — I10 Essential (primary) hypertension: Secondary | ICD-10-CM | POA: Insufficient documentation

## 2018-11-29 DIAGNOSIS — Z20828 Contact with and (suspected) exposure to other viral communicable diseases: Secondary | ICD-10-CM | POA: Insufficient documentation

## 2018-11-29 DIAGNOSIS — S065XAA Traumatic subdural hemorrhage with loss of consciousness status unknown, initial encounter: Secondary | ICD-10-CM

## 2018-11-29 DIAGNOSIS — S065X9A Traumatic subdural hemorrhage with loss of consciousness of unspecified duration, initial encounter: Secondary | ICD-10-CM

## 2018-11-29 LAB — URINALYSIS, ROUTINE W REFLEX MICROSCOPIC
Bacteria, UA: NONE SEEN
Bilirubin Urine: NEGATIVE
Glucose, UA: NEGATIVE mg/dL
Hgb urine dipstick: NEGATIVE
Ketones, ur: NEGATIVE mg/dL
Leukocytes,Ua: NEGATIVE
Nitrite: NEGATIVE
Protein, ur: 100 mg/dL — AB
Specific Gravity, Urine: 1.017 (ref 1.005–1.030)
pH: 6 (ref 5.0–8.0)

## 2018-11-29 LAB — CBC WITH DIFFERENTIAL/PLATELET
Abs Immature Granulocytes: 0.03 10*3/uL (ref 0.00–0.07)
Basophils Absolute: 0.1 10*3/uL (ref 0.0–0.1)
Basophils Relative: 1 %
Eosinophils Absolute: 0.2 10*3/uL (ref 0.0–0.5)
Eosinophils Relative: 3 %
HCT: 37.9 % — ABNORMAL LOW (ref 39.0–52.0)
Hemoglobin: 12 g/dL — ABNORMAL LOW (ref 13.0–17.0)
Immature Granulocytes: 1 %
Lymphocytes Relative: 22 %
Lymphs Abs: 1.4 10*3/uL (ref 0.7–4.0)
MCH: 28.2 pg (ref 26.0–34.0)
MCHC: 31.7 g/dL (ref 30.0–36.0)
MCV: 89.2 fL (ref 80.0–100.0)
Monocytes Absolute: 0.6 10*3/uL (ref 0.1–1.0)
Monocytes Relative: 9 %
Neutro Abs: 4.1 10*3/uL (ref 1.7–7.7)
Neutrophils Relative %: 64 %
Platelets: 265 10*3/uL (ref 150–400)
RBC: 4.25 MIL/uL (ref 4.22–5.81)
RDW: 14.6 % (ref 11.5–15.5)
WBC: 6.3 10*3/uL (ref 4.0–10.5)
nRBC: 0 % (ref 0.0–0.2)

## 2018-11-29 LAB — BASIC METABOLIC PANEL
Anion gap: 11 (ref 5–15)
BUN: 22 mg/dL (ref 8–23)
CO2: 25 mmol/L (ref 22–32)
Calcium: 9.2 mg/dL (ref 8.9–10.3)
Chloride: 106 mmol/L (ref 98–111)
Creatinine, Ser: 1.75 mg/dL — ABNORMAL HIGH (ref 0.61–1.24)
GFR calc Af Amer: 41 mL/min — ABNORMAL LOW (ref >60)
GFR calc non Af Amer: 35 mL/min — ABNORMAL LOW (ref >60)
Glucose, Bld: 143 mg/dL — ABNORMAL HIGH (ref 70–99)
Potassium: 3.4 mmol/L — ABNORMAL LOW (ref 3.5–5.1)
Sodium: 142 mmol/L (ref 135–145)

## 2018-11-29 LAB — URINE DRUG SCREEN, QUALITATIVE (ARMC ONLY)
Amphetamines, Ur Screen: NOT DETECTED
Barbiturates, Ur Screen: NOT DETECTED
Benzodiazepine, Ur Scrn: NOT DETECTED
Cannabinoid 50 Ng, Ur ~~LOC~~: NOT DETECTED
Cocaine Metabolite,Ur ~~LOC~~: NOT DETECTED
MDMA (Ecstasy)Ur Screen: NOT DETECTED
Methadone Scn, Ur: NOT DETECTED
Opiate, Ur Screen: NOT DETECTED
Phencyclidine (PCP) Ur S: NOT DETECTED
Tricyclic, Ur Screen: NOT DETECTED

## 2018-11-29 LAB — SARS CORONAVIRUS 2 BY RT PCR (HOSPITAL ORDER, PERFORMED IN ~~LOC~~ HOSPITAL LAB): SARS Coronavirus 2: NEGATIVE

## 2018-11-29 LAB — PROTIME-INR
INR: 1 (ref 0.8–1.2)
Prothrombin Time: 13.3 seconds (ref 11.4–15.2)

## 2018-11-29 MED ORDER — LABETALOL HCL 5 MG/ML IV SOLN
5.0000 mg | Freq: Once | INTRAVENOUS | Status: AC
  Administered 2018-11-29: 5 mg via INTRAVENOUS
  Filled 2018-11-29: qty 4

## 2018-11-29 NOTE — ED Notes (Signed)
Pt transported by EMS to [facility] at this time.

## 2018-11-29 NOTE — ED Notes (Signed)
Pt signed paper transfer consent form.

## 2018-11-29 NOTE — ED Notes (Signed)
Report provided to [employer], Therapist, sports for [facility] 3B bed 23

## 2018-11-29 NOTE — ED Triage Notes (Signed)
Pt presents to ED via [facility] from [employer] [facility] where bystanders called EMS after seeing pt stagger slightly when walking through the door. No fall. Pt states he occasionally feels like his legs are weakening and attributes it to getting older. Pt has no complaints at this time. Ambulatory to bathroom with steady gait.

## 2018-11-29 NOTE — ED Provider Notes (Signed)
[facility] Emergency Department Provider Note    First MD Initiated Contact with Patient 11/29/18 1255     (approximate)  I have reviewed the triage vital signs and the nursing notes.   HISTORY  Chief Complaint Weakness    HPI Tommy Heath is a 85 y.o. male  presents to the ER via EMS after being found with a staggering gait briefly KMW.  States he does have this intermittently.  Feels like his blood sugar gets low.  States he did eat something again W now feels well.  Denies any headache.  Does have a history of anemia and requires blood transfusions at the cancer center.  Denies any headache.  No numbness or tingling.  No chest pain palpitations or shortness of breath.   Past Medical History:  Diagnosis Date  . Allergic rhinitis   . Anemia   . AVM (arteriovenous malformation) of colon   . BPH (benign prostatic hyperplasia)   . Cataract cortical, senile   . Colon polyps 02/2005, 2012   TUBULAR ADENOMA  . Diabetes mellitus type 2, uncomplicated ([place])   . Duodenal ulcer    w/ gastric ulcer  . Glaucoma   . H. pylori infection   . H. pylori infection   . History of blood product transfusion   . History of blood transfusion 02/2005   hgb was 6  . History of duodenal ulcer 02/2005   The lesion was 5 mm in largest dimension.  . Hyperlipidemia   . Hypertension   . Sleep apnea    BiPap   Family History  Problem Relation Age of Onset  . Diabetes Mother   . Breast cancer Sister   . Prostate cancer Neg Hx   . Bladder Cancer Neg Hx   . Kidney cancer Neg Hx    Past Surgical History:  Procedure Laterality Date  . COLONOSCOPY     07/2010, 09/09/2002, 10/15/2002, 02/16/2005, Adenomatous polyps, repeat 5 years, Dr Vira Agar  . COLONOSCOPY WITH PROPOFOL N/A 03/01/2015   Procedure: COLONOSCOPY WITH PROPOFOL;  Surgeon: Manya Silvas, MD;  Location: [facility] ENDOSCOPY;  Service: Endoscopy;  Laterality: N/A;  . ESOPHAGOGASTRODUODENOSCOPY     08/17/2010, 02/16/2005,  no repeat per RTE  . ESOPHAGOGASTRODUODENOSCOPY (EGD) WITH PROPOFOL N/A 03/01/2015   Procedure: ESOPHAGOGASTRODUODENOSCOPY (EGD) WITH PROPOFOL;  Surgeon: Manya Silvas, MD;  Location: [facility] ENDOSCOPY;  Service: Endoscopy;  Laterality: N/A;  . ESOPHAGOGASTRODUODENOSCOPY (EGD) WITH PROPOFOL N/A 05/15/2016   Procedure: ESOPHAGOGASTRODUODENOSCOPY (EGD) WITH PROPOFOL;  Surgeon: Manya Silvas, MD;  Location: [facility] ENDOSCOPY;  Service: Endoscopy;  Laterality: N/A;   Patient Active Problem List   Diagnosis Date Noted  . 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 Angiodysplasia of colon without hemorrhage 07/04/2015  . GERD without esophagitis 07/04/2015  . Absolute anemia 01/17/2015  . Benign fibroma of prostate 01/17/2015  . Type 2 diabetes mellitus ([place]) 01/17/2015  . HLD (hyperlipidemia) 01/17/2015  . BP (high blood pressure) 01/17/2015  . Apnea, sleep 01/17/2015  . Iron deficiency anemia due to chronic blood loss 01/17/2015  . Enlarged prostate without lower urinary tract symptoms (luts) 01/17/2015      Prior to Admission medications   Medication Sig Start Date End Date Taking? Authorizing Provider  amLODipine (NORVASC) 10 MG tablet Take 10 mg by mouth daily.  07/27/14  Yes [provider]  atorvastatin (LIPITOR) 80 MG tablet Take 80 mg by mouth daily.   Yes [provider]  glipiZIDE (GLUCOTROL) 10 MG tablet Take 10 mg  by mouth daily before breakfast.   Yes [provider]  metFORMIN (GLUCOPHAGE) 1000 MG tablet Take 500 mg by mouth 2 (two) times daily with a meal.  12/27/14  Yes [provider]  pioglitazone (ACTOS) 30 MG tablet Take 45 mg by mouth daily.    Yes [provider]  ramipril (ALTACE) 10 MG capsule Take 10 mg by mouth 2 (two) times daily.    Yes [provider]  simvastatin (ZOCOR) 40 MG tablet Take 40 mg by mouth daily.   Yes [provider]  sitaGLIPtin (JANUVIA) 100 MG tablet Take 100 mg by mouth daily.  06/28/14   Yes [provider]  tamsulosin (FLOMAX) 0.4 MG CAPS capsule Take 0.4 mg by mouth daily.   Yes [provider]  finasteride (PROSCAR) 5 MG tablet Take 1 tablet (5 mg total) by mouth daily. Patient not taking: Reported on 11/29/2018 01/30/18   Hollice Espy, MD    Allergies Patient has no known allergies.    Social History Social History   Tobacco Use  . Smoking status: Former Smoker    Packs/day: 1.00    Years: 40.00    Pack years: 40.00    Types: Cigarettes    Quit date: 01/07/1993    Years since quitting: 25.9  . Smokeless tobacco: Never Used  Substance Use Topics  . Alcohol use: No    Alcohol/week: 0.0 standard drinks  . Drug use: No    Review of Systems Patient denies headaches, rhinorrhea, blurry vision, numbness, shortness of breath, chest pain, edema, cough, abdominal pain, nausea, vomiting, diarrhea, dysuria, fevers, rashes or hallucinations unless otherwise stated above in HPI. ____________________________________________   PHYSICAL EXAM:  VITAL SIGNS: Vitals:   11/29/18 1300  BP: (!) 167/78  Pulse: 60  Resp: 18  Temp: 98 F (36.7 C)  SpO2: 100%    Constitutional: Alert and oriented.  Eyes: Conjunctivae are normal.  Head: Atraumatic. Nose: No congestion/rhinnorhea. Mouth/Throat: Mucous membranes are moist.   Neck: No stridor. Painless ROM.  Cardiovascular: Normal rate, regular rhythm. Grossly normal heart sounds.  Good peripheral circulation. Respiratory: Normal respiratory effort.  No retractions. Lungs CTAB. Gastrointestinal: Soft and nontender. No distention. No abdominal bruits. No CVA tenderness. Genitourinary:  Musculoskeletal: No lower extremity tenderness nor edema.  No joint effusions. Neurologic: CN- intact.  No facial droop, Normal FNF.  Normal heel to shin.  Sensation intact bilaterally. Normal speech and language. No gross focal neurologic deficits are appreciated. No gait instability. Skin:  Skin is warm, dry and  intact. No rash noted. Psychiatric: Mood and affect are normal. Speech and behavior are normal.  ____________________________________________   LABS (all labs ordered are listed, but only abnormal results are displayed)  Results for orders placed or performed during the hospital encounter of 11/29/18 (from the past 24 hour(s))  Basic metabolic panel     Status: Abnormal   Collection Time: 11/29/18  1:07 PM  Result Value Ref Range   Sodium 142 135 - 145 mmol/L   Potassium 3.4 (L) 3.5 - 5.1 mmol/L   Chloride 106 98 - 111 mmol/L   CO2 25 22 - 32 mmol/L   Glucose, Bld 143 (H) 70 - 99 mg/dL   BUN 22 8 - 23 mg/dL   Creatinine, Ser 1.75 (H) 0.61 - 1.24 mg/dL   Calcium 9.2 8.9 - 10.3 mg/dL   GFR calc non Af Amer 35 (L) >60 mL/min   GFR calc Af Amer 41 (L) >60 mL/min   Anion gap  11 5 - 15  Urine Drug Screen, Qualitative     Status: None   Collection Time: 11/29/18  1:07 PM  Result Value Ref Range   Tricyclic, Ur Screen NONE DETECTED NONE DETECTED   Amphetamines, Ur Screen NONE DETECTED NONE DETECTED   MDMA (Ecstasy)Ur Screen NONE DETECTED NONE DETECTED   Cocaine Metabolite,Ur [place] NONE DETECTED NONE DETECTED   Opiate, Ur Screen NONE DETECTED NONE DETECTED   Phencyclidine (PCP) Ur S NONE DETECTED NONE DETECTED   Cannabinoid 50 Ng, Ur [place] NONE DETECTED NONE DETECTED   Barbiturates, Ur Screen NONE DETECTED NONE DETECTED   Benzodiazepine, Ur Scrn NONE DETECTED NONE DETECTED   Methadone Scn, Ur NONE DETECTED NONE DETECTED  Urinalysis, Routine w reflex microscopic     Status: Abnormal   Collection Time: 11/29/18  1:07 PM  Result Value Ref Range   Color, Urine YELLOW (A) YELLOW   APPearance CLEAR (A) CLEAR   Specific Gravity, Urine 1.017 1.005 - 1.030   pH 6.0 5.0 - 8.0   Glucose, UA NEGATIVE NEGATIVE mg/dL   Hgb urine dipstick NEGATIVE NEGATIVE   Bilirubin Urine NEGATIVE NEGATIVE   Ketones, ur NEGATIVE NEGATIVE mg/dL   Protein, ur 100 (A) NEGATIVE mg/dL   Nitrite NEGATIVE NEGATIVE    Leukocytes,Ua NEGATIVE NEGATIVE   RBC / HPF 0-5 0 - 5 RBC/hpf   WBC, UA 0-5 0 - 5 WBC/hpf   Bacteria, UA NONE SEEN NONE SEEN   Squamous Epithelial / LPF 0-5 0 - 5   Mucus PRESENT   CBC WITH DIFFERENTIAL     Status: Abnormal   Collection Time: 11/29/18  1:07 PM  Result Value Ref Range   WBC 6.3 4.0 - 10.5 K/uL   RBC 4.25 4.22 - 5.81 MIL/uL   Hemoglobin 12.0 (L) 13.0 - 17.0 g/dL   HCT 37.9 (L) 39.0 - 52.0 %   MCV 89.2 80.0 - 100.0 fL   MCH 28.2 26.0 - 34.0 pg   MCHC 31.7 30.0 - 36.0 g/dL   RDW 14.6 11.5 - 15.5 %   Platelets 265 150 - 400 K/uL   nRBC 0.0 0.0 - 0.2 %   Neutrophils Relative % 64 %   Neutro Abs 4.1 1.7 - 7.7 K/uL   Lymphocytes Relative 22 %   Lymphs Abs 1.4 0.7 - 4.0 K/uL   Monocytes Relative 9 %   Monocytes Absolute 0.6 0.1 - 1.0 K/uL   Eosinophils Relative 3 %   Eosinophils Absolute 0.2 0.0 - 0.5 K/uL   Basophils Relative 1 %   Basophils Absolute 0.1 0.0 - 0.1 K/uL   Immature Granulocytes 1 %   Abs Immature Granulocytes 0.03 0.00 - 0.07 K/uL  Protime-INR     Status: None   Collection Time: 11/29/18  1:07 PM  Result Value Ref Range   Prothrombin Time 13.3 11.4 - 15.2 seconds   INR 1.0 0.8 - 1.2   ____________________________________________  EKG My review and personal interpretation at Time: 13:28   Indication: weakness  Rate: 50  Rhythm: sinus Axis: left Other: baseline wander no bo STEMI or depressions noted. ____________________________________________  RADIOLOGY  I personally reviewed all radiographic images ordered to evaluate for the above acute complaints and reviewed radiology reports and findings.  These findings were personally discussed with the patient.  Please see medical record for radiology report.  ____________________________________________   PROCEDURES  Procedure(s) performed:  .Critical Care Performed by: Merlyn Lot, MD Authorized by: Merlyn Lot, MD   Critical care provider statement:  Critical care time  (minutes):  30   Critical care time was exclusive of:  Separately billable procedures and treating other patients   Critical care was necessary to treat or prevent imminent or life-threatening deterioration of the following conditions:  CNS failure or compromise   Critical care was time spent personally by me on the following activities:  Development of treatment plan with patient or surrogate, discussions with consultants, evaluation of patient's response to treatment, examination of patient, obtaining history from patient or surrogate, ordering and performing treatments and interventions, ordering and review of laboratory studies, ordering and review of radiographic studies, pulse oximetry, re-evaluation of patient's condition and review of old charts      Critical Care performed: yes ____________________________________________   INITIAL IMPRESSION / ASSESSMENT AND PLAN / ED COURSE  Pertinent labs & imaging results that were available during my care of the patient were reviewed by me and considered in my medical decision making (see chart for details).   DDX: Electrolyte abnormality, dehydration, hypoglycemia, anemia, CVA, dysrhythmia  Tommy Heath is a 85 y.o. who presents to the ED with staggering unsteady gait as described above.  Patient currently well-appearing is able to ambulate with steady gait does have some mild weakness on the left side.  Denies any recent falls.  Almost sounds concerning for hypoglycemic episode however given his age and risk factors CT imaging as well as blood work will be ordered for above differential.  Clinical Course as of Nov 28 1549  Sun Nov 29, 2018  1400 CT imaging shows evidence of acute and subacute subdural hematoma with midline shift and effacement of the ventricle.  After diligent search of his medical records no listed anticoagulation.  Does not take an aspirin he reports.   [PR]  0881 Discussed case with Dr. Ranelle Oyster neuro ICU at [facility]  and is kindly accepted patient for further medical management.   [PR]    Clinical Course User Index [PR] Merlyn Lot, MD    The patient was evaluated in Emergency Department today for the symptoms described in the history of present illness. He/she was evaluated in the context of the global COVID-19 pandemic, which necessitated consideration that the patient might be at risk for infection with the SARS-CoV-2 virus that causes COVID-19. Institutional protocols and algorithms that pertain to the evaluation of patients at risk for COVID-19 are in a state of rapid change based on information released by regulatory bodies including the CDC and federal and state organizations. These policies and algorithms were followed during the patient's care in the ED.  As part of my medical decision making, I reviewed the following data within the [facility] notes reviewed and incorporated, Labs reviewed, notes from prior ED visits and [place] Controlled Substance Database   ____________________________________________   FINAL CLINICAL IMPRESSION(S) / ED DIAGNOSES  Final diagnoses:  Subdural hematoma ([place])  Weakness      NEW MEDICATIONS STARTED DURING THIS VISIT:  New Prescriptions   No medications on file     Note:  This document was prepared using Dragon voice recognition software and may include unintentional dictation errors.    Merlyn Lot, MD 11/29/18 [phone]

## 2019-01-14 NOTE — Progress Notes (Signed)
[facility]  [address] [place], [place] [address] Phone: [phone]  Fax: [phone]   Clinic Day:  01/19/2019  Referring physician: Sherrin Daisy, MD  Chief Complaint: Tommy Heath is a 86 y.o. male with iron deficiency anemia who is seen for 6 month assessment.  HPI: The patient was last seen in the hematology clinic on 07/21/2018 as a new patient. At that time, he felt "not good, not bad".  Exam was unremarkable. Hematocrit 31.8, hemoglobin 10.2, and MCV 88.6. Ferritin was 17 with an iron saturation of 14% and a TIBC of 346. Potassium was 3.3. Creatinine was 1.70.  We discussed consideration for a capsule study.   He received Venofer weekly x 4 (07/21/2018 - 08/11/2018).   He was admitted to [facility] on 11/29/2018 - 12/02/2018 with right sided acute on chronic subdural hematoma (SDH). Head CT showed an unchanged right convexity SDH measuring approximately 2.4 cm. He underwent right-sided burr holes for drainage of SDH with Dr. Colin Mulders, Dr. Floy Sabina, and Dr. Harle Stanford on 11/30/2018. Patient remained under close monitoring in the ICU until stable. His subdural drain was later removed in its entirety with tip verified intact.  During admission the patient was evaluated by physical therapy, occupational therapy and speech therapy. The recommended home with home health PT and OT. Patient was directed to take tylenol QID x 3 days. His aspirin and naproxen was held x 1 week after surgery. He was advised to not strain or lift heavy objects.     He had a follow up with Dr. Colin Mulders at [facility] on 01/18/2019. He denied any complaints. He had no weakness or headaches. Patient will follow up prn.   Labs followed: 10/26/2018:  Hematocrit 34.8, hemoglobin 11.2, MCV 89.0, platelets 215,000, WBC 6,100. Ferritin 64 with an iron saturation of 20% and a TIBC of 318. 11/16/2018:  Hematocrit 33.4, hemoglobin 10.8, MCV 89.5, platelets 237,000, WBC 5,500. Ferritin 56.    11/29/2018:  Hematocrit 37.9, hemoglobin 12.0, MCV 89.2, platelets 265,000, WBC 6,300. 01/18/2019:  Hematocrit 35.1, hemoglobin 11.0, MCV 89.1, platelets 257,000, WBC 5,700. Ferritin 42 with an iron saturation of 19% and a TIBC of 329.  During the interim, he has felt "ok".  Patient feels better since initial presentation with a subdural hematoma. He denies any trouble walking. His energy level is "pretty good". He denies any headaches, visual changes or weakness. He has dark stools related to taking oral iron daily. He continues to have frequency at night time. He denies restless legs.   His BP is 182/81 in the clinic today.    Past Medical History:  Diagnosis Date  . Allergic rhinitis   . Anemia   . AVM (arteriovenous malformation) of colon   . BPH (benign prostatic hyperplasia)   . Cataract cortical, senile   . Colon polyps 02/2005, 2012   TUBULAR ADENOMA  . Diabetes mellitus type 2, uncomplicated ([place])   . Duodenal ulcer    w/ gastric ulcer  . Glaucoma   . H. pylori infection   . H. pylori infection   . History of blood product transfusion   . History of blood transfusion 02/2005   hgb was 6  . History of duodenal ulcer 02/2005   The lesion was 5 mm in largest dimension.  . Hyperlipidemia   . Hypertension   . Sleep apnea    BiPap    Past Surgical History:  Procedure Laterality Date  . COLONOSCOPY     07/2010, 09/09/2002, 10/15/2002, 02/16/2005, Adenomatous  polyps, repeat 5 years, Dr Vira Agar  . COLONOSCOPY WITH PROPOFOL N/A 03/01/2015   Procedure: COLONOSCOPY WITH PROPOFOL;  Surgeon: Manya Silvas, MD;  Location: [facility] ENDOSCOPY;  Service: Endoscopy;  Laterality: N/A;  . ESOPHAGOGASTRODUODENOSCOPY     08/17/2010, 02/16/2005, no repeat per RTE  . ESOPHAGOGASTRODUODENOSCOPY (EGD) WITH PROPOFOL N/A 03/01/2015   Procedure: ESOPHAGOGASTRODUODENOSCOPY (EGD) WITH PROPOFOL;  Surgeon: Manya Silvas, MD;  Location: [facility] ENDOSCOPY;  Service: Endoscopy;  Laterality: N/A;  .  ESOPHAGOGASTRODUODENOSCOPY (EGD) WITH PROPOFOL N/A 05/15/2016   Procedure: ESOPHAGOGASTRODUODENOSCOPY (EGD) WITH PROPOFOL;  Surgeon: Manya Silvas, MD;  Location: [facility] ENDOSCOPY;  Service: Endoscopy;  Laterality: N/A;    Family History  Problem Relation Age of Onset  . Diabetes Mother   . Breast cancer Sister   . Prostate cancer Neg Hx   . Bladder Cancer Neg Hx   . Kidney cancer Neg Hx     Social History:  reports that he quit smoking about 26 years ago. His smoking use included cigarettes. He has a 40.00 pack-year smoking history. He has never used smokeless tobacco. He reports that he does not drink alcohol or use drugs.  He is retired from [employer].  He lives in [place], [place]. He denies any exposure to toxins and radiation. The patient is alone today.  Allergies: No Known Allergies  Current Medications: Current Outpatient Medications  Medication Sig Dispense Refill  . amLODipine (NORVASC) 10 MG tablet Take 10 mg by mouth daily.     Marland Kitchen atorvastatin (LIPITOR) 80 MG tablet Take 80 mg by mouth daily.    . finasteride (PROSCAR) 5 MG tablet Take 1 tablet (5 mg total) by mouth daily. 90 tablet 3  . glipiZIDE (GLUCOTROL) 10 MG tablet Take 10 mg by mouth daily before breakfast.    . metFORMIN (GLUCOPHAGE) 1000 MG tablet Take 500 mg by mouth 2 (two) times daily with a meal.     . pioglitazone (ACTOS) 30 MG tablet Take 45 mg by mouth daily.     . ramipril (ALTACE) 10 MG capsule Take 10 mg by mouth 2 (two) times daily.     . simvastatin (ZOCOR) 40 MG tablet Take 40 mg by mouth daily.    . sitaGLIPtin (JANUVIA) 100 MG tablet Take 100 mg by mouth daily.     . tamsulosin (FLOMAX) 0.4 MG CAPS capsule Take 0.4 mg by mouth daily.     No current facility-administered medications for this visit.   Facility-Administered Medications Ordered in Other Visits  Medication Dose Route Frequency Provider Last Rate Last Admin  . iron sucrose (VENOFER) injection 200 mg  200 mg Intravenous Once  Cammie Sickle, MD        Review of Systems  Constitutional: Negative.  Negative for chills, diaphoresis, fever, malaise/fatigue and weight loss (up 6 pounds).       Doing "ok".  Energy level is "pretty good".  HENT: Negative.  Negative for congestion, ear pain, hearing loss, nosebleeds, sinus pain and sore throat.   Eyes: Negative.  Negative for blurred vision and double vision.  Respiratory: Negative.  Negative for cough, hemoptysis and shortness of breath.   Cardiovascular: Negative for chest pain, palpitations and leg swelling.  Gastrointestinal: Negative for abdominal pain, blood in stool, constipation, diarrhea, heartburn, nausea and vomiting.       Dark stool and oral iron.  Genitourinary: Positive for frequency (night time). Negative for dysuria, hematuria and urgency.  Musculoskeletal: Negative.  Negative for back pain, joint pain, myalgias and neck  pain.  Skin: Negative.  Negative for rash.  Neurological: Negative.  Negative for dizziness, tingling, sensory change, speech change, focal weakness, seizures, weakness and headaches.  Endo/Heme/Allergies: Negative.  Does not bruise/bleed easily.  Psychiatric/Behavioral: Negative.  Negative for depression and memory loss. The patient is not nervous/anxious and does not have insomnia.   All other systems reviewed and are negative.  Performance status (ECOG):  1  Vitals Blood pressure (!) 182/81, pulse 65, temperature 98.5 F (36.9 C), temperature source Tympanic, resp. rate 18, height 5' 6.5" (1.689 m), weight 189 lb 0.7 oz (85.7 kg), SpO2 100 %.   Physical Exam  Constitutional: He is oriented to person, place, and time. He appears well-developed and well-nourished. No distress.  HENT:  Head: Normocephalic and atraumatic.  Mouth/Throat: Oropharynx is clear and moist. No oropharyngeal exudate.  Black cap. Short gray hair.  Mask.  Eyes: Pupils are equal, round, and reactive to light. Conjunctivae and EOM are normal. No scleral  icterus.  Oval glasses.  Brown eyes.  Neck: No JVD present.  Cardiovascular: Normal rate, regular rhythm and normal heart sounds.  No murmur heard. Pulmonary/Chest: Effort normal and breath sounds normal. No respiratory distress. He has no wheezes. He has no rales.  Abdominal: Soft. Bowel sounds are normal. He exhibits no distension. There is no abdominal tenderness. There is no rebound and no guarding.  Musculoskeletal:        General: Edema (bilater lower extremities (R>L)) present. No tenderness. Normal range of motion.     Cervical back: Normal range of motion and neck supple.  Lymphadenopathy:    He has no cervical adenopathy.    He has no axillary adenopathy.       Right: No supraclavicular adenopathy present.       Left: No supraclavicular adenopathy present.  Neurological: He is alert and oriented to person, place, and time. He has normal reflexes.  Skin: Skin is warm and dry. No rash noted. He is not diaphoretic. No erythema. No pallor.  Psychiatric: He has a normal mood and affect. His behavior is normal. Judgment and thought content normal.  Nursing note and vitals reviewed.   Appointment on 01/18/2019  Component Date Value Ref Range Status  . Sodium 01/18/2019 137  135 - 145 mmol/L Final  . Potassium 01/18/2019 3.9  3.5 - 5.1 mmol/L Final  . Chloride 01/18/2019 104  98 - 111 mmol/L Final  . CO2 01/18/2019 24  22 - 32 mmol/L Final  . Glucose, Bld 01/18/2019 131* 70 - 99 mg/dL Final  . BUN 01/18/2019 23  8 - 23 mg/dL Final  . Creatinine, Ser 01/18/2019 1.73* 0.61 - 1.24 mg/dL Final  . Calcium 01/18/2019 8.9  8.9 - 10.3 mg/dL Final  . GFR calc non Af Amer 01/18/2019 36* >60 mL/min Final  . GFR calc Af Amer 01/18/2019 41* >60 mL/min Final  . Anion gap 01/18/2019 9  5 - 15 Final   Performed at [facility], [address] [place], [place] [address]  . Iron 01/18/2019 61  45 - 182 ug/dL Final  . TIBC 01/18/2019 329  250 - 450 ug/dL Final  . Saturation Ratios  01/18/2019 19  17.9 - 39.5 % Final  . UIBC 01/18/2019 268  ug/dL Final   Performed at [facility], [address], [place],  [address]  . Ferritin 01/18/2019 42  24 - 336 ng/mL Final   Performed at [facility], [place] [place],  [address]  .  WBC 01/18/2019 5.7  4.0 - 10.5 K/uL Final  . RBC 01/18/2019 3.94* 4.22 - 5.81 MIL/uL Final  . Hemoglobin 01/18/2019 11.0* 13.0 - 17.0 g/dL Final  . HCT 01/18/2019 35.1* 39.0 - 52.0 % Final  . MCV 01/18/2019 89.1  80.0 - 100.0 fL Final  . MCH 01/18/2019 27.9  26.0 - 34.0 pg Final  . MCHC 01/18/2019 31.3  30.0 - 36.0 g/dL Final  . RDW 01/18/2019 15.2  11.5 - 15.5 % Final  . Platelets 01/18/2019 257  150 - 400 K/uL Final  . nRBC 01/18/2019 0.0  0.0 - 0.2 % Final  . Neutrophils Relative % 01/18/2019 68  % Final  . Neutro Abs 01/18/2019 3.9  1.7 - 7.7 K/uL Final  . Lymphocytes Relative 01/18/2019 18  % Final  . Lymphs Abs 01/18/2019 1.0  0.7 - 4.0 K/uL Final  . Monocytes Relative 01/18/2019 11  % Final  . Monocytes Absolute 01/18/2019 0.6  0.1 - 1.0 K/uL Final  . Eosinophils Relative 01/18/2019 1  % Final  . Eosinophils Absolute 01/18/2019 0.1  0.0 - 0.5 K/uL Final  . Basophils Relative 01/18/2019 1  % Final  . Basophils Absolute 01/18/2019 0.1  0.0 - 0.1 K/uL Final  . Immature Granulocytes 01/18/2019 1  % Final  . Abs Immature Granulocytes 01/18/2019 0.03  0.00 - 0.07 K/uL Final   Performed at [facility], [address], [place], [place] [address]    Assessment:  Tommy Heath is a 86 y.o. male with iron deficiency anemia.   He was noted to have a history of upper GI bleed secondary to gastric ulcer and duodenal AVM in 2007 and 2012.  He received PRBCs in 2007 for a hemoglobin of 6.  Diet appears good.  Work-upon 01/17/2015 revealed a hematocrit of 28.4, hemoglobin 8.8, MCV 74.6, platelets 314,000, white count 6500.  Ferritin was 7 with an iron saturation of 6% and a TIBC of 449.  Normal labs  included:  B12, folate, haptoglobin, LDH, SPEP and free light chain ratio.    EGD on 03/01/2015 revealed gastritis and a few nonbleeding angiectasias in the duodenum treated by argon plasma coagulation (APC).  Colonoscopy on 03/01/2015 revealed one diminutive polyp in the descending colon (tubular adenoma).  There was a single non-bleeding colonic angioectasia treated with APC.  There was diverticulosis in the sigmoid colon, in the descending colon and in the transverse colon.  There were internal hemorrhoids.  He has received Venofer: weekly x 4 (01/24/2015 - 02/14/2015), x 4 (08/01/2015 - 08/23/2015), x 4 (01/23/2016 - 02/13/2016), x4 (07/23/2016 - 08/14/2016), x 5 (01/21/2017 - 02/19/2017), x2 (07/22/2017 - 07/29/2017), 01/20/2018, and x 4 (07/21/2018 - 08/11/2018).  Ferritin has been followed: 7 on 01/17/2015, 60 on 03/21/2015, 14 on 07/25/2015, 16 on 01/16/2016, 23 on 07/16/2016, 9 on 01/16/2017, 19 on 07/17/2017, 12 on 01/15/2018, 17 on 07/20/2018.  He has stage IIIb/IV chronic kidney disease.  Creatinine was 1.73 (CrCl 26.96 ml/min) on 01/18/2019.  He was admitted to [facility] on 11/29/2018 - 12/02/2018 with right sided acute on chronic subdural hematoma (SDH). Head CT showed an unchanged right convexity SDH measuring approximately 2.4 cm. He underwent right-sided burr holes for drainage of SDH on 11/30/2018.  Symptomatically, he feels "ok".  Energy level is "pretty good".  He denies any bleeding.  Stool is dark on oral iron.  Plan: 1.   Review labs from 01/18/2019. 2.   Iron deficiency anemia  He has received PRBCs in the past for GI bleeding.             He has had a gastric ulcer/AVM and angioectasias treated with APC.             Diet is good.             Hematocrit 35.1.  Hemoglobin 11.0.  MCV 89.1 on 01/18/2019.              Ferritin 42 with an iron saturation of 19% (normal).             He last received Venofer on 08/11/2018.  He is currently on oral iron.   Ferritin  goal 100.             Consider capsule study.             Follow-up with gastroenterology (patient previously seen by Dr Vira Agar). 3.   Stage IIIb/IV chronic kidney disease  Creatinine 1.73 (CrCl 33.5 ml/min) on 01/18/2019.  Suspect chronic kidney disease contributing to anemia.  Continue to monitor. 4.   Please schedule appt with Dr Alice Reichert (patient of [facility]). 5.   RTC in 6 weeks for labs (CBC, ferritin). 6.   RTC in 3 months for MD assessment, labs (CBC with diff, ferritin, iron studies- day before), and +/- Venofer.  I discussed the assessment and treatment plan with the patient.  The patient was provided an opportunity to ask questions and all were answered.  The patient agreed with the plan and demonstrated an understanding of the instructions.  The patient was advised to call back if the symptoms worsen or if the condition fails to improve as anticipated.  I provided 23 minutes of face-to-face time during this this encounter and > 50% was spent counseling as documented under my assessment and plan.  An additional 10 minutes were spent reviewing Epic and Care Everywhere including notes and labs.   Lequita Asal, MD, PhD    01/19/2019, 10:49 AM  I, Selena Batten, am acting as scribe for [employer]. Mike Gip, MD, PhD.  I, Olivene Cookston C. Mike Gip, MD, have reviewed the above documentation for accuracy and completeness, and I agree with the above.

## 2019-01-18 ENCOUNTER — Inpatient Hospital Stay: Payer: Medicare Other | Attending: Hematology and Oncology

## 2019-01-18 ENCOUNTER — Other Ambulatory Visit: Payer: Self-pay

## 2019-01-18 DIAGNOSIS — I129 Hypertensive chronic kidney disease with stage 1 through stage 4 chronic kidney disease, or unspecified chronic kidney disease: Secondary | ICD-10-CM | POA: Diagnosis not present

## 2019-01-18 DIAGNOSIS — I6203 Nontraumatic chronic subdural hemorrhage: Secondary | ICD-10-CM | POA: Diagnosis not present

## 2019-01-18 DIAGNOSIS — Z803 Family history of malignant neoplasm of breast: Secondary | ICD-10-CM | POA: Insufficient documentation

## 2019-01-18 DIAGNOSIS — E1122 Type 2 diabetes mellitus with diabetic chronic kidney disease: Secondary | ICD-10-CM | POA: Insufficient documentation

## 2019-01-18 DIAGNOSIS — N184 Chronic kidney disease, stage 4 (severe): Secondary | ICD-10-CM | POA: Diagnosis not present

## 2019-01-18 DIAGNOSIS — D500 Iron deficiency anemia secondary to blood loss (chronic): Secondary | ICD-10-CM

## 2019-01-18 DIAGNOSIS — Z87891 Personal history of nicotine dependence: Secondary | ICD-10-CM | POA: Diagnosis not present

## 2019-01-18 DIAGNOSIS — D509 Iron deficiency anemia, unspecified: Secondary | ICD-10-CM | POA: Diagnosis present

## 2019-01-18 LAB — CBC WITH DIFFERENTIAL/PLATELET
Abs Immature Granulocytes: 0.03 10*3/uL (ref 0.00–0.07)
Basophils Absolute: 0.1 10*3/uL (ref 0.0–0.1)
Basophils Relative: 1 %
Eosinophils Absolute: 0.1 10*3/uL (ref 0.0–0.5)
Eosinophils Relative: 1 %
HCT: 35.1 % — ABNORMAL LOW (ref 39.0–52.0)
Hemoglobin: 11 g/dL — ABNORMAL LOW (ref 13.0–17.0)
Immature Granulocytes: 1 %
Lymphocytes Relative: 18 %
Lymphs Abs: 1 10*3/uL (ref 0.7–4.0)
MCH: 27.9 pg (ref 26.0–34.0)
MCHC: 31.3 g/dL (ref 30.0–36.0)
MCV: 89.1 fL (ref 80.0–100.0)
Monocytes Absolute: 0.6 10*3/uL (ref 0.1–1.0)
Monocytes Relative: 11 %
Neutro Abs: 3.9 10*3/uL (ref 1.7–7.7)
Neutrophils Relative %: 68 %
Platelets: 257 10*3/uL (ref 150–400)
RBC: 3.94 MIL/uL — ABNORMAL LOW (ref 4.22–5.81)
RDW: 15.2 % (ref 11.5–15.5)
WBC: 5.7 10*3/uL (ref 4.0–10.5)
nRBC: 0 % (ref 0.0–0.2)

## 2019-01-18 LAB — BASIC METABOLIC PANEL
Anion gap: 9 (ref 5–15)
BUN: 23 mg/dL (ref 8–23)
CO2: 24 mmol/L (ref 22–32)
Calcium: 8.9 mg/dL (ref 8.9–10.3)
Chloride: 104 mmol/L (ref 98–111)
Creatinine, Ser: 1.73 mg/dL — ABNORMAL HIGH (ref 0.61–1.24)
GFR calc Af Amer: 41 mL/min — ABNORMAL LOW (ref >60)
GFR calc non Af Amer: 36 mL/min — ABNORMAL LOW (ref >60)
Glucose, Bld: 131 mg/dL — ABNORMAL HIGH (ref 70–99)
Potassium: 3.9 mmol/L (ref 3.5–5.1)
Sodium: 137 mmol/L (ref 135–145)

## 2019-01-18 LAB — IRON AND TIBC
Iron: 61 ug/dL (ref 45–182)
Saturation Ratios: 19 % (ref 17.9–39.5)
TIBC: 329 ug/dL (ref 250–450)
UIBC: 268 ug/dL

## 2019-01-18 LAB — FERRITIN: Ferritin: 42 ng/mL (ref 24–336)

## 2019-01-18 NOTE — Progress Notes (Signed)
No new changes noted today. The patient name and DOB has been verified by phone today. 

## 2019-01-19 ENCOUNTER — Inpatient Hospital Stay: Payer: Medicare Other

## 2019-01-19 ENCOUNTER — Inpatient Hospital Stay (HOSPITAL_BASED_OUTPATIENT_CLINIC_OR_DEPARTMENT_OTHER): Payer: Medicare Other | Admitting: Hematology and Oncology

## 2019-01-19 ENCOUNTER — Encounter: Payer: Self-pay | Admitting: Hematology and Oncology

## 2019-01-19 VITALS — BP 182/81 | HR 65 | Temp 98.5°F | Resp 18 | Ht 66.5 in | Wt 189.0 lb

## 2019-01-19 DIAGNOSIS — N189 Chronic kidney disease, unspecified: Secondary | ICD-10-CM | POA: Diagnosis not present

## 2019-01-19 DIAGNOSIS — D631 Anemia in chronic kidney disease: Secondary | ICD-10-CM

## 2019-01-19 DIAGNOSIS — D500 Iron deficiency anemia secondary to blood loss (chronic): Secondary | ICD-10-CM | POA: Diagnosis not present

## 2019-01-19 DIAGNOSIS — D509 Iron deficiency anemia, unspecified: Secondary | ICD-10-CM | POA: Diagnosis not present

## 2019-03-01 ENCOUNTER — Other Ambulatory Visit: Payer: Self-pay

## 2019-03-02 ENCOUNTER — Inpatient Hospital Stay: Payer: Medicare Other | Attending: Hematology and Oncology

## 2019-03-02 DIAGNOSIS — D509 Iron deficiency anemia, unspecified: Secondary | ICD-10-CM | POA: Diagnosis present

## 2019-03-02 DIAGNOSIS — D631 Anemia in chronic kidney disease: Secondary | ICD-10-CM

## 2019-03-02 DIAGNOSIS — N189 Chronic kidney disease, unspecified: Secondary | ICD-10-CM

## 2019-03-02 DIAGNOSIS — D500 Iron deficiency anemia secondary to blood loss (chronic): Secondary | ICD-10-CM

## 2019-03-02 LAB — CBC
HCT: 35.3 % — ABNORMAL LOW (ref 39.0–52.0)
Hemoglobin: 11.2 g/dL — ABNORMAL LOW (ref 13.0–17.0)
MCH: 28.4 pg (ref 26.0–34.0)
MCHC: 31.7 g/dL (ref 30.0–36.0)
MCV: 89.4 fL (ref 80.0–100.0)
Platelets: 236 10*3/uL (ref 150–400)
RBC: 3.95 MIL/uL — ABNORMAL LOW (ref 4.22–5.81)
RDW: 15.5 % (ref 11.5–15.5)
WBC: 5.7 10*3/uL (ref 4.0–10.5)
nRBC: 0 % (ref 0.0–0.2)

## 2019-03-02 LAB — FERRITIN: Ferritin: 44 ng/mL (ref 24–336)

## 2019-04-19 ENCOUNTER — Other Ambulatory Visit: Payer: Self-pay

## 2019-04-19 ENCOUNTER — Inpatient Hospital Stay: Payer: Medicare Other | Attending: Hematology and Oncology

## 2019-04-19 DIAGNOSIS — D509 Iron deficiency anemia, unspecified: Secondary | ICD-10-CM | POA: Insufficient documentation

## 2019-04-19 DIAGNOSIS — D500 Iron deficiency anemia secondary to blood loss (chronic): Secondary | ICD-10-CM

## 2019-04-19 DIAGNOSIS — D631 Anemia in chronic kidney disease: Secondary | ICD-10-CM

## 2019-04-19 LAB — IRON AND TIBC
Iron: 63 ug/dL (ref 45–182)
Saturation Ratios: 19 % (ref 17.9–39.5)
TIBC: 328 ug/dL (ref 250–450)
UIBC: 265 ug/dL

## 2019-04-19 LAB — FERRITIN: Ferritin: 43 ng/mL (ref 24–336)

## 2019-04-19 LAB — CBC WITH DIFFERENTIAL/PLATELET
Abs Immature Granulocytes: 0.03 10*3/uL (ref 0.00–0.07)
Basophils Absolute: 0.1 10*3/uL (ref 0.0–0.1)
Basophils Relative: 1 %
Eosinophils Absolute: 0.1 10*3/uL (ref 0.0–0.5)
Eosinophils Relative: 2 %
HCT: 36.6 % — ABNORMAL LOW (ref 39.0–52.0)
Hemoglobin: 11.6 g/dL — ABNORMAL LOW (ref 13.0–17.0)
Immature Granulocytes: 1 %
Lymphocytes Relative: 24 %
Lymphs Abs: 1.4 10*3/uL (ref 0.7–4.0)
MCH: 28.2 pg (ref 26.0–34.0)
MCHC: 31.7 g/dL (ref 30.0–36.0)
MCV: 88.8 fL (ref 80.0–100.0)
Monocytes Absolute: 0.5 10*3/uL (ref 0.1–1.0)
Monocytes Relative: 9 %
Neutro Abs: 3.8 10*3/uL (ref 1.7–7.7)
Neutrophils Relative %: 63 %
Platelets: 242 10*3/uL (ref 150–400)
RBC: 4.12 MIL/uL — ABNORMAL LOW (ref 4.22–5.81)
RDW: 15.5 % (ref 11.5–15.5)
WBC: 6 10*3/uL (ref 4.0–10.5)
nRBC: 0 % (ref 0.0–0.2)

## 2019-04-19 NOTE — Progress Notes (Signed)
[facility]  [address], [address] [place], [place] [address] Phone: [phone]  Fax: [phone]   Clinic Day:  04/20/2019  Referring physician: [facility]  Chief Complaint: Tommy Heath is a 86 y.o. male with iron deficiency anemia who is seen for a 3 month assessment.  HPI: The patient was last seen in the hematology clinic on 01/19/2019. At that time, he felt "ok". Energy level was "pretty good".  He denied any bleeding. Stool was dark on oral iron. Hematocrit was 35.1, hemoglobin 11.0, MCV 89.1, platelets 257,000, WBC 5,700. Ferritin was 42 with an iron saturation of 19% and a TIBC of 329. Creatinine was 1.73.   We discussed consideration of a capsule study. I spoke with Tammi Klippel, PA at the [facility].  He was symptomatically doing well with stable labs.  Last EGD was in 2017 and last colonoscopy was in 2012.  Due to insurance guidelines, he would need to repeat an EGD and colonoscopy within 1 year of a capsule study to be covered.  Given his age and comorbidities (acute on chronic subdural hematoma requiring admission at [facility]) as well in is in the setting of COVID-19, endoscopic evaluation was not recommended unless he had new GI symptoms or concerns.  Labs followed: 03/02/2019: Hematocrit 35.3, hemoglobin 11.2, platelets 236,000, WBC 5,700. Ferritin 44. 04/19/2019: Hematocrit 36.6, hemoglobin 11.6, platelets 242,000, WBC 6,000. Ferritin 43 with an iron saturation of 19% and a TIBC of 328.  During the interim, the patient has been doing good. He remains on oral iron BID. I encouraged that patient to take orange juice and/or vitamin C for better absorption. He is taking oral B12. He notes frequency at night related to prostate issues. He has melena due to oral iron. He reports ankle swelling. He has no other symptoms. Patient reports having PCP care at the [place].    Past Medical History:  Diagnosis Date  . Allergic rhinitis   .  Anemia   . AVM (arteriovenous malformation) of colon   . BPH (benign prostatic hyperplasia)   . Cataract cortical, senile   . Colon polyps 02/2005, 2012   TUBULAR ADENOMA  . Diabetes mellitus type 2, uncomplicated ([place])   . Duodenal ulcer    w/ gastric ulcer  . Glaucoma   . H. pylori infection   . H. pylori infection   . History of blood product transfusion   . History of blood transfusion 02/2005   hgb was 6  . History of duodenal ulcer 02/2005   The lesion was 5 mm in largest dimension.  . Hyperlipidemia   . Hypertension   . Sleep apnea    BiPap    Past Surgical History:  Procedure Laterality Date  . COLONOSCOPY     07/2010, 09/09/2002, 10/15/2002, 02/16/2005, Adenomatous polyps, repeat 5 years, Dr Vira Agar  . COLONOSCOPY WITH PROPOFOL N/A 03/01/2015   Procedure: COLONOSCOPY WITH PROPOFOL;  Surgeon: Manya Silvas, MD;  Location: [facility] ENDOSCOPY;  Service: Endoscopy;  Laterality: N/A;  . ESOPHAGOGASTRODUODENOSCOPY     08/17/2010, 02/16/2005, no repeat per RTE  . ESOPHAGOGASTRODUODENOSCOPY (EGD) WITH PROPOFOL N/A 03/01/2015   Procedure: ESOPHAGOGASTRODUODENOSCOPY (EGD) WITH PROPOFOL;  Surgeon: Manya Silvas, MD;  Location: [facility] ENDOSCOPY;  Service: Endoscopy;  Laterality: N/A;  . ESOPHAGOGASTRODUODENOSCOPY (EGD) WITH PROPOFOL N/A 05/15/2016   Procedure: ESOPHAGOGASTRODUODENOSCOPY (EGD) WITH PROPOFOL;  Surgeon: Manya Silvas, MD;  Location: [facility] ENDOSCOPY;  Service: Endoscopy;  Laterality: N/A;    Family History  Problem Relation  Age of Onset  . Diabetes Mother   . Breast cancer Sister   . Prostate cancer Neg Hx   . Bladder Cancer Neg Hx   . Kidney cancer Neg Hx     Social History:  reports that he quit smoking about 26 years ago. His smoking use included cigarettes. He has a 40.00 pack-year smoking history. He has never used smokeless tobacco. He reports that he does not drink alcohol or use drugs. 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 [place]in [place], [place]. He denies any  exposure to toxins and radiation. The patient is alone today.  Allergies: No Known Allergies  Current Medications: Current Outpatient Medications  Medication Sig Dispense Refill  . amLODipine (NORVASC) 10 MG tablet Take 10 mg by mouth daily.     Marland Kitchen atorvastatin (LIPITOR) 80 MG tablet Take 80 mg by mouth daily.    . finasteride (PROSCAR) 5 MG tablet Take 1 tablet (5 mg total) by mouth daily. 90 tablet 3  . glipiZIDE (GLUCOTROL) 10 MG tablet Take 10 mg by mouth daily before breakfast.    . metFORMIN (GLUCOPHAGE) 1000 MG tablet Take 500 mg by mouth 2 (two) times daily with a meal.     . pioglitazone (ACTOS) 30 MG tablet Take 45 mg by mouth daily.     . ramipril (ALTACE) 10 MG capsule Take 10 mg by mouth 2 (two) times daily.     . simvastatin (ZOCOR) 40 MG tablet Take 40 mg by mouth daily.    . sitaGLIPtin (JANUVIA) 100 MG tablet Take 100 mg by mouth daily.     . tamsulosin (FLOMAX) 0.4 MG CAPS capsule Take 0.4 mg by mouth daily.     No current facility-administered medications for this visit.   Facility-Administered Medications Ordered in Other Visits  Medication Dose Route Frequency Provider Last Rate Last Admin  . iron sucrose (VENOFER) injection 200 mg  200 mg Intravenous Once Cammie Sickle, MD        Review of Systems  Constitutional: Negative.  Negative for chills, diaphoresis, fever, malaise/fatigue and weight loss (stable).       Doing good.  HENT: Negative.  Negative for congestion, ear pain, hearing loss, nosebleeds, sinus pain and sore throat.   Eyes: Negative.  Negative for blurred vision and double vision.  Respiratory: Negative.  Negative for cough, hemoptysis and shortness of breath.   Cardiovascular: Positive for leg swelling (ankles). Negative for chest pain and palpitations.  Gastrointestinal: Positive for melena (on oral iron). Negative for abdominal pain, blood in stool, constipation, diarrhea, heartburn, nausea and vomiting.  Genitourinary: Positive for  frequency (night time). Negative for dysuria, hematuria and urgency.  Musculoskeletal: Negative.  Negative for back pain, joint pain, myalgias and neck pain.  Skin: Negative.  Negative for rash.  Neurological: Negative.  Negative for dizziness, tingling, sensory change, speech change, focal weakness, seizures, weakness and headaches.  Endo/Heme/Allergies: Negative.  Does not bruise/bleed easily.  Psychiatric/Behavioral: Negative.  Negative for depression and memory loss. The patient is not nervous/anxious and does not have insomnia.   All other systems reviewed and are negative.  Performance status (ECOG): 1  Vitals Blood pressure (!) 183/72, pulse (!) 56, temperature (!) 96.1 F (35.6 C), temperature source Tympanic, height 5' 6.5" (1.689 m), weight 189 lb 6 oz (85.9 kg), SpO2 100 %.   Physical Exam  Constitutional: He is oriented to person, place, and time. He appears well-developed and well-nourished. No distress.  HENT:  Head: Normocephalic and atraumatic.  Mouth/Throat: Oropharynx  is clear and moist. No oropharyngeal exudate.  Black cap. Short gray hair.  Mask.  Eyes: Pupils are equal, round, and reactive to light. Conjunctivae and EOM are normal. No scleral icterus.  Oval glasses.  Brown eyes.  Neck: No JVD present.  Cardiovascular: Normal rate, regular rhythm and normal heart sounds.  No murmur heard. Pulmonary/Chest: Effort normal and breath sounds normal. No respiratory distress. He has no wheezes. He has no rales.  Abdominal: Soft. Bowel sounds are normal. He exhibits no distension. There is no abdominal tenderness. There is no rebound and no guarding.  Musculoskeletal:        General: Edema (ankles; 2 +) present. No tenderness. Normal range of motion.     Cervical back: Normal range of motion and neck supple.  Lymphadenopathy:    He has no cervical adenopathy.    He has no axillary adenopathy.       Right: No supraclavicular adenopathy present.       Left: No  supraclavicular adenopathy present.  Neurological: He is alert and oriented to person, place, and time. He has normal reflexes.  Skin: Skin is warm and dry. No rash noted. He is not diaphoretic. No erythema. No pallor.  Psychiatric: He has a normal mood and affect. His behavior is normal. Judgment and thought content normal.  Nursing note and vitals reviewed.   Appointment on 04/19/2019  Component Date Value Ref Range Status  . Iron 04/19/2019 63  45 - 182 ug/dL Final  . TIBC 04/19/2019 328  250 - 450 ug/dL Final  . Saturation Ratios 04/19/2019 19  17.9 - 39.5 % Final  . UIBC 04/19/2019 265  ug/dL Final   Performed at [facility], [address], [place], [place] [address]  . WBC 04/19/2019 6.0  4.0 - 10.5 K/uL Final  . RBC 04/19/2019 4.12* 4.22 - 5.81 MIL/uL Final  . Hemoglobin 04/19/2019 11.6* 13.0 - 17.0 g/dL Final  . HCT 04/19/2019 36.6* 39.0 - 52.0 % Final  . MCV 04/19/2019 88.8  80.0 - 100.0 fL Final  . MCH 04/19/2019 28.2  26.0 - 34.0 pg Final  . MCHC 04/19/2019 31.7  30.0 - 36.0 g/dL Final  . RDW 04/19/2019 15.5  11.5 - 15.5 % Final  . Platelets 04/19/2019 242  150 - 400 K/uL Final  . nRBC 04/19/2019 0.0  0.0 - 0.2 % Final  . Neutrophils Relative % 04/19/2019 63  % Final  . Neutro Abs 04/19/2019 3.8  1.7 - 7.7 K/uL Final  . Lymphocytes Relative 04/19/2019 24  % Final  . Lymphs Abs 04/19/2019 1.4  0.7 - 4.0 K/uL Final  . Monocytes Relative 04/19/2019 9  % Final  . Monocytes Absolute 04/19/2019 0.5  0.1 - 1.0 K/uL Final  . Eosinophils Relative 04/19/2019 2  % Final  . Eosinophils Absolute 04/19/2019 0.1  0.0 - 0.5 K/uL Final  . Basophils Relative 04/19/2019 1  % Final  . Basophils Absolute 04/19/2019 0.1  0.0 - 0.1 K/uL Final  . Immature Granulocytes 04/19/2019 1  % Final  . Abs Immature Granulocytes 04/19/2019 0.03  0.00 - 0.07 K/uL Final   Performed at [facility], [address], [place], [place] [address]  . Ferritin 04/19/2019 43  24 - 336  ng/mL Final   Performed at [facility], [address] [place], [place] [address]    Assessment:  Meer Reindl is a 86 y.o. male withiron deficiency anemia. He was noted to have a history of upper  GI bleed secondary to gastric ulcer and duodenal AVM in 2007 and 2012. He received PRBCsin 2007 for a hemoglobin of 6. Dietappears good.  Work-upon 01/10/2017revealed a hematocrit of 28.4, hemoglobin 8.8, MCV 74.6, platelets 314,000, white count 6500. Ferritin was 7 with an iron saturation of 6% and a TIBC of 449. Normal labsincluded: B12, folate, haptoglobin, LDH, SPEP and free light chain ratio.   EGD on 03/01/2015 revealedgastritis and a few nonbleeding angiectasiasin the duodenum treated by argon plasma coagulation (APC).Colonoscopy on 03/01/2015 revealed one diminutive polyp in the descending colon(tubular adenoma). There was a single non-bleeding colonic angioectasiatreated with APC.There was diverticulosis in the sigmoid colon, in the descending colon and in the transverse colon.There were internal hemorrhoids.  He has received Venofer: weekly x 4 (01/24/2015 - 02/14/2015), x 4 (08/01/2015 - 08/23/2015), x 4 (01/23/2016 - 02/13/2016), x4 (07/23/2016 - 08/14/2016), x 5 (01/21/2017 - 02/19/2017), x2 (07/22/2017 - 07/29/2017), 01/20/2018, and x 4 (07/21/2018 - 08/11/2018).  Ferritinhas beenfollowed: 7 on 01/17/2015, 60 on 03/21/2015, 14 on 07/25/2015, 16 on 01/16/2016, 23 on 07/16/2016, 9 on 01/16/2017, 19 on 07/17/2017, 12 on 01/15/2018, 17 on 07/20/2018.  He has stage IIIb/IV chronic kidney disease. Creatinine was 1.73 (CrCl 26.96 ml/min) on 01/18/2019.  He was admitted to [facility] on 11/29/2018 - 12/02/2018 with right sided acute on chronic subdural hematoma (SDH). Head CT showed an unchanged right convexity SDH measuring approximately 2.4 cm. He underwent right-sided burr holes for drainage of SDH on 11/30/2018.  Symptomatically, he feels good.  He denies  any bleeding.  He notes dark stools on oral iron.  Exam is stable.  Plan: 1.   Review labs from 04/19/2019. 2. Iron deficiency anemia He has received PRBCs in the past for GI bleeding. He has had a gastric ulcer/AVM and angioectasias treated with APC. Diet is good. Hematocrit 36.6. Hemoglobin 11.6. MCV 88.8 on 04/19/2019.             Ferritin 43 with aniron saturation of 19% (normal). He last received Venofer on 08/11/2018.             He remains on oral iron.                         Ferritin goal 100. Capsule study deferred secondary to insurance issues, co-morbidities, and COVID-19.   He would require repeat endoscopies within 1 year of VCE.  Continue to monitor. 3.Stage IIIb/IV chronic kidney disease             Creatinine 1.73 (CrCl 33.5 ml/min) on 01/18/2019.             Suspect some component of chronic kidney disease contributing to anemia.             Continue to monitor. 4.   RTC in 3 months for labs (CBC, ferritin). 5.   RTC in 6 months for MD assessment, labs (CBC with diff, ferritin, iron studies- day before), and +/- Venofer.  I discussed the assessment and treatment plan with the patient.  The patient was provided an opportunity to ask questions and all were answered.  The patient agreed with the plan and demonstrated an understanding of the instructions.  The patient was advised to call back if the symptoms worsen or if the condition fails to improve as anticipated.   Lequita Asal, MD, PhD    04/20/2019, 10:31 AM  I, Selena Batten, am acting as scribe for [employer]. Mike Gip, MD, PhD.  I, Lenna Sciara  Elmarie Mainland, MD, have reviewed the above documentation for accuracy and completeness, and I agree with the above.

## 2019-04-20 ENCOUNTER — Inpatient Hospital Stay: Payer: Medicare Other

## 2019-04-20 ENCOUNTER — Inpatient Hospital Stay (HOSPITAL_BASED_OUTPATIENT_CLINIC_OR_DEPARTMENT_OTHER): Payer: Medicare Other | Admitting: Hematology and Oncology

## 2019-04-20 ENCOUNTER — Encounter: Payer: Self-pay | Admitting: Hematology and Oncology

## 2019-04-20 VITALS — BP 183/72 | HR 56 | Temp 96.1°F | Ht 66.5 in | Wt 189.4 lb

## 2019-04-20 DIAGNOSIS — N1832 Chronic kidney disease, stage 3b: Secondary | ICD-10-CM | POA: Diagnosis not present

## 2019-04-20 DIAGNOSIS — D500 Iron deficiency anemia secondary to blood loss (chronic): Secondary | ICD-10-CM | POA: Diagnosis not present

## 2019-04-20 DIAGNOSIS — D509 Iron deficiency anemia, unspecified: Secondary | ICD-10-CM | POA: Diagnosis not present

## 2019-04-20 DIAGNOSIS — D631 Anemia in chronic kidney disease: Secondary | ICD-10-CM | POA: Diagnosis not present

## 2019-04-20 DIAGNOSIS — N183 Chronic kidney disease, stage 3 unspecified: Secondary | ICD-10-CM | POA: Insufficient documentation

## 2019-04-20 NOTE — Progress Notes (Signed)
Patient reports he has just taking his blood pressure medication 30 min.

## 2019-07-05 ENCOUNTER — Ambulatory Visit
Admission: EM | Admit: 2019-07-05 | Discharge: 2019-07-05 | Disposition: A | Discharge status: Home / Self Care | Payer: Medicare Other | Attending: Family Medicine | Admitting: Family Medicine

## 2019-07-05 ENCOUNTER — Other Ambulatory Visit: Payer: Self-pay

## 2019-07-05 DIAGNOSIS — M545 Low back pain, unspecified: Secondary | ICD-10-CM

## 2019-07-05 MED ORDER — TRAMADOL HCL 50 MG PO TABS: 50.0000 mg | ORAL_TABLET | Freq: Two times a day (BID) | ORAL | 0 refills | Status: DC | PRN

## 2019-07-05 NOTE — ED Provider Notes (Signed)
[facility]    CSN: [id number] Arrival date & time: 07/05/19  1207      History   Chief Complaint Chief Complaint  Patient presents with  . Back Pain   HPI  86 year old male presents with back pain.  1 week history of intermittent back pain.  Seems to be worse when he sits down in a chair for a bit of time.  Also seems to have trouble getting to stand and when he walks at certain times.  No pain at this time.  No radicular symptoms.  No other associated symptoms.  No other complaints.  Past Medical History:  Diagnosis Date  . Allergic rhinitis   . Anemia   . AVM (arteriovenous malformation) of colon   . BPH (benign prostatic hyperplasia)   . Cataract cortical, senile   . Colon polyps 02/2005, 2012   TUBULAR ADENOMA  . Diabetes mellitus type 2, uncomplicated ([place])   . Duodenal ulcer    w/ gastric ulcer  . Glaucoma   . H. pylori infection   . H. pylori infection   . History of blood product transfusion   . History of blood transfusion 02/2005   hgb was 6  . History of duodenal ulcer 02/2005   The lesion was 5 mm in largest dimension.  . Hyperlipidemia   . Hypertension   . Sleep apnea    BiPap    Patient Active Problem List   Diagnosis Date Noted  . Anemia in stage 3 chronic kidney disease 04/20/2019  . 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 Angiodysplasia of colon without hemorrhage 07/04/2015  . GERD without esophagitis 07/04/2015  . Absolute anemia 01/17/2015  . Benign fibroma of prostate 01/17/2015  . Type 2 diabetes mellitus ([place]) 01/17/2015  . HLD (hyperlipidemia) 01/17/2015  . BP (high blood pressure) 01/17/2015  . Apnea, sleep 01/17/2015  . Iron deficiency anemia due to chronic blood loss 01/17/2015  . Enlarged prostate without lower urinary tract symptoms (luts) 01/17/2015    Past Surgical History:  Procedure Laterality Date  . COLONOSCOPY     07/2010, 09/09/2002, 10/15/2002, 02/16/2005, Adenomatous polyps, repeat 5 years, Dr Vira Agar  .  COLONOSCOPY WITH PROPOFOL N/A 03/01/2015   Procedure: COLONOSCOPY WITH PROPOFOL;  Surgeon: Manya Silvas, MD;  Location: [facility] ENDOSCOPY;  Service: Endoscopy;  Laterality: N/A;  . ESOPHAGOGASTRODUODENOSCOPY     08/17/2010, 02/16/2005, no repeat per RTE  . ESOPHAGOGASTRODUODENOSCOPY (EGD) WITH PROPOFOL N/A 03/01/2015   Procedure: ESOPHAGOGASTRODUODENOSCOPY (EGD) WITH PROPOFOL;  Surgeon: Manya Silvas, MD;  Location: [facility] ENDOSCOPY;  Service: Endoscopy;  Laterality: N/A;  . ESOPHAGOGASTRODUODENOSCOPY (EGD) WITH PROPOFOL N/A 05/15/2016   Procedure: ESOPHAGOGASTRODUODENOSCOPY (EGD) WITH PROPOFOL;  Surgeon: Manya Silvas, MD;  Location: [facility] ENDOSCOPY;  Service: Endoscopy;  Laterality: N/A;       Home Medications    Prior to Admission medications   Medication Sig Start Date End Date Taking? Authorizing Provider  amLODipine (NORVASC) 10 MG tablet Take 10 mg by mouth daily.  07/27/14   [provider]  atorvastatin (LIPITOR) 80 MG tablet Take 80 mg by mouth daily.    [provider]  finasteride (PROSCAR) 5 MG tablet Take 1 tablet (5 mg total) by mouth daily. 01/30/18   Hollice Espy, MD  glipiZIDE (GLUCOTROL) 10 MG tablet Take 10 mg by mouth daily before breakfast.    [provider]  metFORMIN (GLUCOPHAGE) 1000 MG tablet Take 500 mg by mouth 2 (two) times daily with a meal.  12/27/14  [provider]  pioglitazone (ACTOS) 30 MG tablet Take 45 mg by mouth daily.     [provider]  ramipril (ALTACE) 10 MG capsule Take 10 mg by mouth 2 (two) times daily.     [provider]  simvastatin (ZOCOR) 40 MG tablet Take 40 mg by mouth daily.    [provider]  sitaGLIPtin (JANUVIA) 100 MG tablet Take 100 mg by mouth daily.  06/28/14   [provider]  tamsulosin (FLOMAX) 0.4 MG CAPS capsule Take 0.4 mg by mouth daily.    [provider]  traMADol (ULTRAM) 50 MG tablet Take 1 tablet (50 mg total) by mouth every 12  (twelve) hours as needed for moderate pain or severe pain. 07/05/19   Coral Spikes, DO    Family History Family History  Problem Relation Age of Onset  . Diabetes Mother   . Breast cancer Sister   . Prostate cancer Neg Hx   . Bladder Cancer Neg Hx   . Kidney cancer Neg Hx     Social History Social History   Tobacco Use  . Smoking status: Former Smoker    Packs/day: 1.00    Years: 40.00    Pack years: 40.00    Types: Cigarettes    Quit date: 01/07/1993    Years since quitting: 26.5  . Smokeless tobacco: Never Used  Vaping Use  . Vaping Use: Never used  Substance Use Topics  . Alcohol use: No    Alcohol/week: 0.0 standard drinks  . Drug use: No     Allergies   Patient has no known allergies.   Review of Systems Review of Systems  Musculoskeletal: Positive for back pain.   Physical Exam Triage Vital Signs ED Triage Vitals  Enc Vitals Group     BP 07/05/19 1246 (!) 179/73     Pulse Rate 07/05/19 1246 (!) 57     Resp 07/05/19 1246 16     Temp 07/05/19 1246 97.9 F (36.6 C)     Temp Source 07/05/19 1246 Oral     SpO2 07/05/19 1246 100 %     Weight 07/05/19 1244 182 lb (82.6 kg)     Height 07/05/19 1244 5' 6.5" (1.689 m)     Head Circumference --      Peak Flow --      Pain Score 07/05/19 1243 0     Pain Loc --      Pain Edu? --      Excl. in [place]? --    Updated Vital Signs BP (!) 179/73 (BP Location: Left Arm)   Pulse (!) 57   Temp 97.9 F (36.6 C) (Oral)   Resp 16   Ht 5' 6.5" (1.689 m)   Wt 82.6 kg   SpO2 100%   BMI 28.94 kg/m   Visual Acuity Right Eye Distance:   Left Eye Distance:   Bilateral Distance:    Right Eye Near:   Left Eye Near:    Bilateral Near:     Physical Exam Vitals and nursing note reviewed.  Constitutional:      General: He is not in acute distress.    Appearance: Normal appearance. He is not ill-appearing.  HENT:     Head: Normocephalic and atraumatic.  Eyes:     General:        Right eye: No discharge.         Left eye: No discharge.     Conjunctiva/sclera: Conjunctivae normal.  Cardiovascular:  Rate and Rhythm: Normal rate and regular rhythm.     Heart sounds: No murmur heard.   Pulmonary:     Effort: Pulmonary effort is normal.     Breath sounds: Normal breath sounds. No wheezing, rhonchi or rales.  Musculoskeletal:     Comments: Lumbar spine -no discrete areas of tenderness on exam.  Neurological:     Mental Status: He is alert.  Psychiatric:        Mood and Affect: Mood normal.        Behavior: Behavior normal.    UC Treatments / Results  Labs (all labs ordered are listed, but only abnormal results are displayed) Labs Reviewed - No data to display  EKG   Radiology No results found.  Procedures Procedures (including critical care time)  Medications Ordered in UC Medications - No data to display  Initial Impression / Assessment and Plan / UC Course  I have reviewed the triage vital signs and the nursing notes.  Pertinent labs & imaging results that were available during my care of the patient were reviewed by me and considered in my medical decision making (see chart for details).    86 year old male presents with intermittent back pain.  Patient doing well at this time.  Tramadol as needed.  Supportive care.  Final Clinical Impressions(s) / UC Diagnoses   Final diagnoses:  Acute bilateral low back pain without sciatica   Discharge Instructions   None    ED Prescriptions    Medication Sig Dispense Auth. Provider   traMADol (ULTRAM) 50 MG tablet Take 1 tablet (50 mg total) by mouth every 12 (twelve) hours as needed for moderate pain or severe pain. 10 tablet Thersa Salt G, DO     I have reviewed the PDMP during this encounter.   Coral Spikes, [place] 07/05/19 2139

## 2019-07-05 NOTE — ED Triage Notes (Signed)
Pt states for about a week he's been having low back pain. Hard to stand and walk after sitting in certain chairs. Does better in straight back chairs but not well in recliners and sofas. No pain in triage currently

## 2019-07-20 ENCOUNTER — Inpatient Hospital Stay: Payer: Medicare Other | Attending: Nurse Practitioner

## 2019-07-20 ENCOUNTER — Other Ambulatory Visit: Payer: Self-pay

## 2019-07-20 DIAGNOSIS — Z79899 Other long term (current) drug therapy: Secondary | ICD-10-CM | POA: Diagnosis not present

## 2019-07-20 DIAGNOSIS — N1832 Chronic kidney disease, stage 3b: Secondary | ICD-10-CM | POA: Insufficient documentation

## 2019-07-20 DIAGNOSIS — D509 Iron deficiency anemia, unspecified: Secondary | ICD-10-CM | POA: Insufficient documentation

## 2019-07-20 LAB — CBC
HCT: 33.9 % — ABNORMAL LOW (ref 39.0–52.0)
Hemoglobin: 10.9 g/dL — ABNORMAL LOW (ref 13.0–17.0)
MCH: 27.9 pg (ref 26.0–34.0)
MCHC: 32.2 g/dL (ref 30.0–36.0)
MCV: 86.9 fL (ref 80.0–100.0)
Platelets: 251 10*3/uL (ref 150–400)
RBC: 3.9 MIL/uL — ABNORMAL LOW (ref 4.22–5.81)
RDW: 15.4 % (ref 11.5–15.5)
WBC: 5.3 10*3/uL (ref 4.0–10.5)
nRBC: 0 % (ref 0.0–0.2)

## 2019-07-20 LAB — FERRITIN: Ferritin: 39 ng/mL (ref 24–336)

## 2019-10-18 ENCOUNTER — Inpatient Hospital Stay: Payer: Medicare Other | Attending: Hematology and Oncology

## 2019-10-18 ENCOUNTER — Other Ambulatory Visit: Payer: Medicare Other

## 2019-10-18 ENCOUNTER — Other Ambulatory Visit: Payer: Self-pay

## 2019-10-18 DIAGNOSIS — Z803 Family history of malignant neoplasm of breast: Secondary | ICD-10-CM | POA: Insufficient documentation

## 2019-10-18 DIAGNOSIS — D509 Iron deficiency anemia, unspecified: Secondary | ICD-10-CM | POA: Insufficient documentation

## 2019-10-18 DIAGNOSIS — N184 Chronic kidney disease, stage 4 (severe): Secondary | ICD-10-CM | POA: Insufficient documentation

## 2019-10-18 DIAGNOSIS — N1832 Chronic kidney disease, stage 3b: Secondary | ICD-10-CM

## 2019-10-18 DIAGNOSIS — Z87891 Personal history of nicotine dependence: Secondary | ICD-10-CM | POA: Diagnosis not present

## 2019-10-18 LAB — CBC
HCT: 35.3 % — ABNORMAL LOW (ref 39.0–52.0)
Hemoglobin: 11.1 g/dL — ABNORMAL LOW (ref 13.0–17.0)
MCH: 27.9 pg (ref 26.0–34.0)
MCHC: 31.4 g/dL (ref 30.0–36.0)
MCV: 88.7 fL (ref 80.0–100.0)
Platelets: 267 10*3/uL (ref 150–400)
RBC: 3.98 MIL/uL — ABNORMAL LOW (ref 4.22–5.81)
RDW: 15.7 % — ABNORMAL HIGH (ref 11.5–15.5)
WBC: 6.2 10*3/uL (ref 4.0–10.5)
nRBC: 0 % (ref 0.0–0.2)

## 2019-10-18 LAB — IRON AND TIBC
Iron: 70 ug/dL (ref 45–182)
Saturation Ratios: 22 % (ref 17.9–39.5)
TIBC: 319 ug/dL (ref 250–450)
UIBC: 249 ug/dL

## 2019-10-18 LAB — FERRITIN: Ferritin: 37 ng/mL (ref 24–336)

## 2019-10-18 NOTE — Progress Notes (Signed)
[facility]  [address], [address] [place], [place] [address] Phone: [phone]  Fax: [phone]   Clinic Day:  10/19/2019  Referring physician: [facility]  Chief Complaint: Tommy Heath is a 86 y.o. male with iron deficiency anemia who is seen for 6 month assessment.  HPI: The patient was last seen in the hematology clinic on 04/20/2019. At that time, he felt good.  He denied any bleeding.  He noted dark stools on oral iron.  Exam was stable. Hematocrit was 36.6, hemoglobin 11.6, MCV 88.8, platelets 242,000, WBC 6,000. Ferritin was 43 with an iron saturation of 19% and a TIBC of 328.  Labs on 07/20/2019 revealed a hematocrit of 33.9, hemoglobin 10.9, MCV 86.9, platelets 251,000, WBC 5,300. Ferritin was 39.  During the interim, he has been "ok".  He takes oral iron BID but does not take it with orange juice or Vitamin C. He used compression socks for his ankles and it resolved. He still has nocturia.  The patient checks his blood pressure at home and notes that it is higher in the morning than at night. He usually has a systolic BP of 947 at nighttime.  The patient has never seen a nephrologist.   Past Medical History:  Diagnosis Date  . Allergic rhinitis   . Anemia   . AVM (arteriovenous malformation) of colon   . BPH (benign prostatic hyperplasia)   . Cataract cortical, senile   . Colon polyps 02/2005, 2012   TUBULAR ADENOMA  . Diabetes mellitus type 2, uncomplicated ([place])   . Duodenal ulcer    w/ gastric ulcer  . Glaucoma   . H. pylori infection   . H. pylori infection   . History of blood product transfusion   . History of blood transfusion 02/2005   hgb was 6  . History of duodenal ulcer 02/2005   The lesion was 5 mm in largest dimension.  . Hyperlipidemia   . Hypertension   . Sleep apnea    BiPap    Past Surgical History:  Procedure Laterality Date  . COLONOSCOPY     07/2010, 09/09/2002, 10/15/2002, 02/16/2005, Adenomatous  polyps, repeat 5 years, Dr Vira Agar  . COLONOSCOPY WITH PROPOFOL N/A 03/01/2015   Procedure: COLONOSCOPY WITH PROPOFOL;  Surgeon: Manya Silvas, MD;  Location: [facility] ENDOSCOPY;  Service: Endoscopy;  Laterality: N/A;  . ESOPHAGOGASTRODUODENOSCOPY     08/17/2010, 02/16/2005, no repeat per RTE  . ESOPHAGOGASTRODUODENOSCOPY (EGD) WITH PROPOFOL N/A 03/01/2015   Procedure: ESOPHAGOGASTRODUODENOSCOPY (EGD) WITH PROPOFOL;  Surgeon: Manya Silvas, MD;  Location: [facility] ENDOSCOPY;  Service: Endoscopy;  Laterality: N/A;  . ESOPHAGOGASTRODUODENOSCOPY (EGD) WITH PROPOFOL N/A 05/15/2016   Procedure: ESOPHAGOGASTRODUODENOSCOPY (EGD) WITH PROPOFOL;  Surgeon: Manya Silvas, MD;  Location: [facility] ENDOSCOPY;  Service: Endoscopy;  Laterality: N/A;    Family History  Problem Relation Age of Onset  . Diabetes Mother   . Breast cancer Sister   . Prostate cancer Neg Hx   . Bladder Cancer Neg Hx   . Kidney cancer Neg Hx     Social History:  reports that he quit smoking about 26 years ago. His smoking use included cigarettes. He has a 40.00 pack-year smoking history. He has never used smokeless tobacco. He reports that he does not drink alcohol and does not use drugs. 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 He [place]in [place], [place]. He denies any exposure to toxins and radiation. The patient is alone today.  Allergies: No Known Allergies  Current Medications:  Current Outpatient Medications  Medication Sig Dispense Refill  . amLODipine (NORVASC) 10 MG tablet Take 10 mg by mouth daily.     Marland Kitchen atorvastatin (LIPITOR) 80 MG tablet Take 80 mg by mouth daily.    . finasteride (PROSCAR) 5 MG tablet Take 1 tablet (5 mg total) by mouth daily. 90 tablet 3  . glipiZIDE (GLUCOTROL) 10 MG tablet Take 10 mg by mouth daily before breakfast.    . metFORMIN (GLUCOPHAGE) 1000 MG tablet Take 500 mg by mouth 2 (two) times daily with a meal.     . pioglitazone (ACTOS) 30 MG tablet Take 45 mg by mouth daily.     . ramipril (ALTACE) 10 MG  capsule Take 10 mg by mouth 2 (two) times daily.     . simvastatin (ZOCOR) 40 MG tablet Take 40 mg by mouth daily.    . sitaGLIPtin (JANUVIA) 100 MG tablet Take 100 mg by mouth daily.     . tamsulosin (FLOMAX) 0.4 MG CAPS capsule Take 0.4 mg by mouth daily.    . traMADol (ULTRAM) 50 MG tablet Take 1 tablet (50 mg total) by mouth every 12 (twelve) hours as needed for moderate pain or severe pain. 10 tablet 0   No current facility-administered medications for this visit.   Facility-Administered Medications Ordered in Other Visits  Medication Dose Route Frequency Provider Last Rate Last Admin  . iron sucrose (VENOFER) injection 200 mg  200 mg Intravenous Once Cammie Sickle, MD        Review of Systems  Constitutional: Positive for weight loss (2 lbs). Negative for chills, diaphoresis, fever and malaise/fatigue.       Feels "ok".  HENT: Negative.  Negative for congestion, ear discharge, ear pain, hearing loss, nosebleeds, sinus pain, sore throat and tinnitus.   Eyes: Negative.  Negative for blurred vision.  Respiratory: Negative.  Negative for cough, hemoptysis, sputum production and shortness of breath.   Cardiovascular: Negative.  Negative for chest pain, palpitations and leg swelling.  Gastrointestinal: Positive for melena (on oral iron). Negative for abdominal pain, blood in stool, constipation, diarrhea, heartburn, nausea and vomiting.  Genitourinary: Positive for frequency (nocturia). Negative for dysuria, hematuria and urgency.  Musculoskeletal: Negative.  Negative for back pain, joint pain, myalgias and neck pain.  Skin: Negative.  Negative for itching and rash.  Neurological: Negative.  Negative for dizziness, tingling, sensory change, weakness and headaches.  Endo/Heme/Allergies: Negative.  Does not bruise/bleed easily.  Psychiatric/Behavioral: Negative.  Negative for depression and memory loss. The patient is not nervous/anxious and does not have insomnia.   All other  systems reviewed and are negative.  Performance status (ECOG): 0-1  Vitals Blood pressure (!) 179/69, pulse 61, temperature 97.7 F (36.5 C), temperature source Tympanic, resp. rate 18, height 5' 6.5" (1.689 m), weight 187 lb 2.7 oz (84.9 kg), SpO2 100 %.   Physical Exam Vitals and nursing note reviewed.  Constitutional:      General: He is not in acute distress.    Appearance: He is well-developed. He is not diaphoretic.  HENT:     Head: Normocephalic and atraumatic.     Comments: Wearing a cap.    Mouth/Throat:     Mouth: Mucous membranes are moist.     Pharynx: Oropharynx is clear. No oropharyngeal exudate.  Eyes:     General: No scleral icterus.    Conjunctiva/sclera: Conjunctivae normal.     Pupils: Pupils are equal, round, and reactive to light.     Comments: Glasses.  Brown eyes.  Neck:     Vascular: No JVD.  Cardiovascular:     Rate and Rhythm: Normal rate and regular rhythm.     Heart sounds: Normal heart sounds. No murmur heard.   Pulmonary:     Effort: Pulmonary effort is normal. No respiratory distress.     Breath sounds: Normal breath sounds. No wheezing or rales.  Abdominal:     General: Bowel sounds are normal. There is no distension.     Palpations: Abdomen is soft. There is no hepatomegaly or splenomegaly.     Tenderness: There is no abdominal tenderness. There is no guarding or rebound.  Musculoskeletal:        General: No tenderness. Normal range of motion.     Cervical back: Normal range of motion and neck supple.  Lymphadenopathy:     Head:     Right side of head: No preauricular, posterior auricular or occipital adenopathy.     Left side of head: No preauricular or posterior auricular adenopathy.     Cervical: No cervical adenopathy.     Upper Body:     Right upper body: No supraclavicular or axillary adenopathy.     Left upper body: No supraclavicular or axillary adenopathy.     Lower Body: No right inguinal adenopathy. No left inguinal  adenopathy.  Skin:    General: Skin is warm and dry.     Coloration: Skin is not pale.     Findings: No erythema or rash.  Neurological:     Mental Status: He is alert and oriented to person, place, and time.     Deep Tendon Reflexes: Reflexes are normal and symmetric.  Psychiatric:        Behavior: Behavior normal.        Thought Content: Thought content normal.        Judgment: Judgment normal.    Appointment on 10/18/2019  Component Date Value Ref Range Status  . Iron 10/18/2019 70  45 - 182 ug/dL Final  . TIBC 10/18/2019 319  250 - 450 ug/dL Final  . Saturation Ratios 10/18/2019 22  17.9 - 39.5 % Final  . UIBC 10/18/2019 249  ug/dL Final   Performed at [facility], [address] [place], [place] [address]  . Ferritin 10/18/2019 37  24 - 336 ng/mL Final   Performed at [facility], [address] [place], [place] [address]  . WBC 10/18/2019 6.2  4.0 - 10.5 K/uL Final  . RBC 10/18/2019 3.98* 4.22 - 5.81 MIL/uL Final  . Hemoglobin 10/18/2019 11.1* 13.0 - 17.0 g/dL Final  . HCT 10/18/2019 35.3* 39 - 52 % Final  . MCV 10/18/2019 88.7  80.0 - 100.0 fL Final  . MCH 10/18/2019 27.9  26.0 - 34.0 pg Final  . MCHC 10/18/2019 31.4  30.0 - 36.0 g/dL Final  . RDW 10/18/2019 15.7* 11.5 - 15.5 % Final  . Platelets 10/18/2019 267  150 - 400 K/uL Final  . nRBC 10/18/2019 0.0  0.0 - 0.2 % Final   Performed at [facility], [address], [place], [place] [address]    Assessment:  Tommy Heath is a 86 y.o. male withiron deficiency anemia. He was noted to have a history of upper GI bleed secondary to gastric ulcer and duodenal AVM in 2007 and 2012. He received PRBCsin 2007 for a hemoglobin of 6. Dietappears good.  Work-upon 01/10/2017revealed a hematocrit of 28.4, hemoglobin 8.8, MCV 74.6, platelets 314,000, white count 6500.  Ferritin was 7 with an iron saturation of 6% and a TIBC of 449. Normal labsincluded: B12, folate, haptoglobin, LDH, SPEP  and free light chain ratio.   EGD on 03/01/2015 revealedgastritis and a few nonbleeding angiectasiasin the duodenum treated by argon plasma coagulation (APC).Colonoscopy on 03/01/2015 revealed one diminutive polyp in the descending colon(tubular adenoma). There was a single non-bleeding colonic angioectasiatreated with APC.There was diverticulosis in the sigmoid colon, in the descending colon and in the transverse colon.There were internal hemorrhoids.  He has received Venofer: weekly x 4 (01/24/2015 - 02/14/2015), x 4 (08/01/2015 - 08/23/2015), x 4 (01/23/2016 - 02/13/2016), x4 (07/23/2016 - 08/14/2016), x 5 (01/21/2017 - 02/19/2017), x2 (07/22/2017 - 07/29/2017), 01/20/2018, and x 4 (07/21/2018 - 08/11/2018).  Ferritinhas beenfollowed: 7 on 01/17/2015, 60 on 03/21/2015, 14 on 07/25/2015, 16 on 01/16/2016, 23 on 07/16/2016, 9 on 01/16/2017, 19 on 07/17/2017, 12 on 01/15/2018, 17 on 07/20/2018.  He has stage IIIb/IV chronic kidney disease. Creatinine was 1.73 (CrCl 26.96 ml/min) on 01/18/2019.  He was admitted to [facility] on 11/29/2018 - 12/02/2018 with right sided acute on chronic subdural hematoma (SDH). Head CT showed an unchanged right convexity SDH measuring approximately 2.4 cm. He underwent right-sided burr holes for drainage of SDH on 11/30/2018.  Symptomatically, he feels "ok".  He denies any bleeding.  Exam is stable.  Plan: 1.   Review labs from 10/18/2019. 2. Iron deficiency anemia He has received PRBCs in the past for GI bleeding. He has had a gastric ulcer/AVM and angioectasias treated with APC. Diet remains good. Hematocrit 36.6. Hemoglobin 11.6. MCV 88.8 on 04/19/2019.             Ferritin 43 with aniron saturation of 19% (normal). Hematocrit 35.3.  Hemoglobin 11.1.  MCV 88.7 on 10/18/2019.   Ferritin 37 with an iron saturation of 22%.             He is on oral iron.                          Ferritin goal 100. Capsule study deferred secondary to insurance issues, co-morbidities, and COVID-19.   He would require repeat endoscopies within 1 year of VCE.  Continue to monitor. 3.Stage IIIb/IV chronic kidney disease             Creatinine 1.73 (CrCl 33.5 ml/min) on 01/18/2019.             Suspect some component of chronic renal disease contributing to anemia.             Continue to monitor. 4.   RTC in 3 months for labs (CBC, Cr, ferritin). 5.   RTC in 6 months for MD assessment, labs (CBC with diff, ferritin, iron studies- day before), and +/- Venofer.  I discussed the assessment and treatment plan with the patient.  The patient was provided an opportunity to ask questions and all were answered.  The patient agreed with the plan and demonstrated an understanding of the instructions.  The patient was advised to call back if the symptoms worsen or if the condition fails to improve as anticipated.   Lequita Asal, MD, PhD    10/19/2019, 10:25 AM  I, Mirian Mo Tufford, am acting as Education administrator for [employer]. Mike Gip, MD, PhD.  I, Kaci Dillie C. Mike Gip, MD, have reviewed the above documentation for accuracy and completeness, and I agree with the above.

## 2019-10-19 ENCOUNTER — Inpatient Hospital Stay: Payer: Medicare Other

## 2019-10-19 ENCOUNTER — Inpatient Hospital Stay (HOSPITAL_BASED_OUTPATIENT_CLINIC_OR_DEPARTMENT_OTHER): Payer: Medicare Other | Admitting: Hematology and Oncology

## 2019-10-19 ENCOUNTER — Encounter: Payer: Self-pay | Admitting: Hematology and Oncology

## 2019-10-19 VITALS — BP 179/69 | HR 61 | Temp 97.7°F | Resp 18 | Ht 66.5 in | Wt 187.2 lb

## 2019-10-19 DIAGNOSIS — D500 Iron deficiency anemia secondary to blood loss (chronic): Secondary | ICD-10-CM | POA: Diagnosis not present

## 2019-10-19 DIAGNOSIS — D631 Anemia in chronic kidney disease: Secondary | ICD-10-CM

## 2019-10-19 DIAGNOSIS — D509 Iron deficiency anemia, unspecified: Secondary | ICD-10-CM | POA: Diagnosis not present

## 2019-10-19 DIAGNOSIS — N1832 Chronic kidney disease, stage 3b: Secondary | ICD-10-CM

## 2019-10-19 NOTE — Progress Notes (Signed)
No new changes noted today 

## 2020-01-17 ENCOUNTER — Other Ambulatory Visit: Payer: Self-pay

## 2020-01-17 ENCOUNTER — Inpatient Hospital Stay: Payer: Medicare Other | Attending: Hematology and Oncology

## 2020-01-17 DIAGNOSIS — N1832 Chronic kidney disease, stage 3b: Secondary | ICD-10-CM | POA: Diagnosis present

## 2020-01-17 DIAGNOSIS — D631 Anemia in chronic kidney disease: Secondary | ICD-10-CM | POA: Diagnosis present

## 2020-01-17 LAB — CBC
HCT: 34 % — ABNORMAL LOW (ref 39.0–52.0)
Hemoglobin: 11 g/dL — ABNORMAL LOW (ref 13.0–17.0)
MCH: 28.1 pg (ref 26.0–34.0)
MCHC: 32.4 g/dL (ref 30.0–36.0)
MCV: 86.7 fL (ref 80.0–100.0)
Platelets: 297 10*3/uL (ref 150–400)
RBC: 3.92 MIL/uL — ABNORMAL LOW (ref 4.22–5.81)
RDW: 15.7 % — ABNORMAL HIGH (ref 11.5–15.5)
WBC: 6.4 10*3/uL (ref 4.0–10.5)
nRBC: 0 % (ref 0.0–0.2)

## 2020-01-17 LAB — FERRITIN: Ferritin: 32 ng/mL (ref 24–336)

## 2020-02-01 IMAGING — CT CT HEAD W/O CM
3 series · 14 of 46 positions shown, 16 images · non-contrast
Comparison: None.

CLINICAL DATA: Altered level of consciousness

EXAM:
CT HEAD WITHOUT CONTRAST
TECHNIQUE: Contiguous axial images were obtained from the base of the skull
through the vertex without intravenous contrast.

[Series 2: head wo · axial · 0.44mm/px · z∈[-102,+18]mm · 8 of 29 slices shown, 10 images]
[im 3/29  brain]
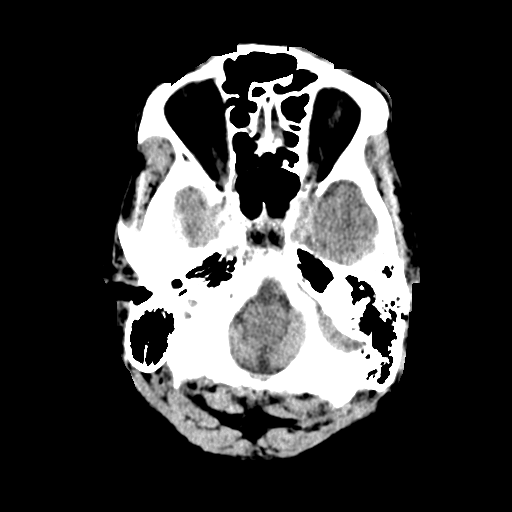
[im 3/29  bone]
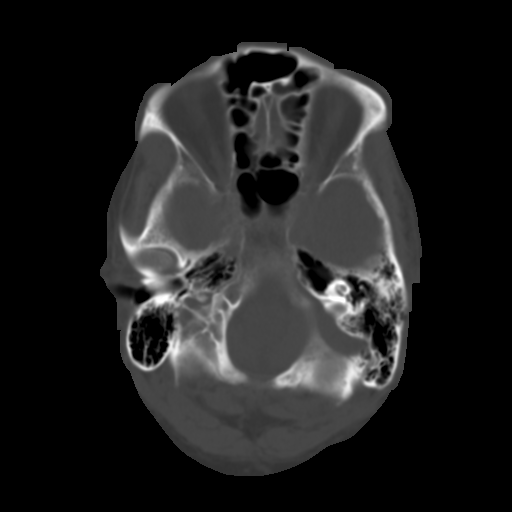
[im 7/29  brain]
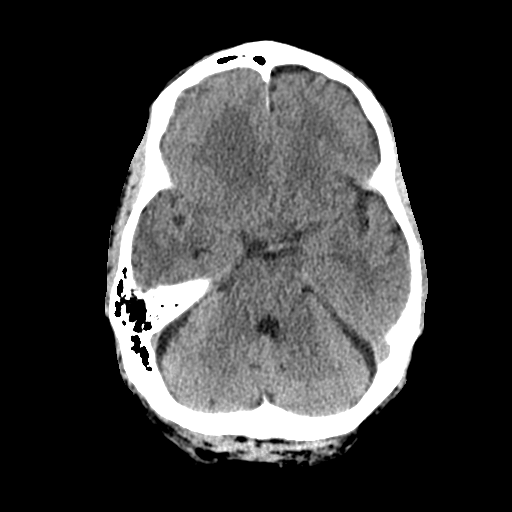
[im 10/29  brain]
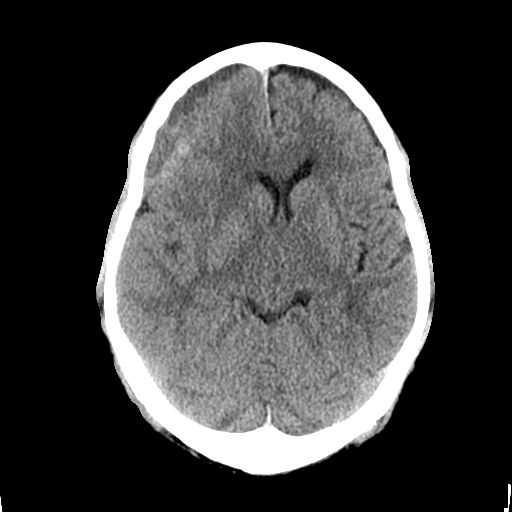
[im 13/29  brain]
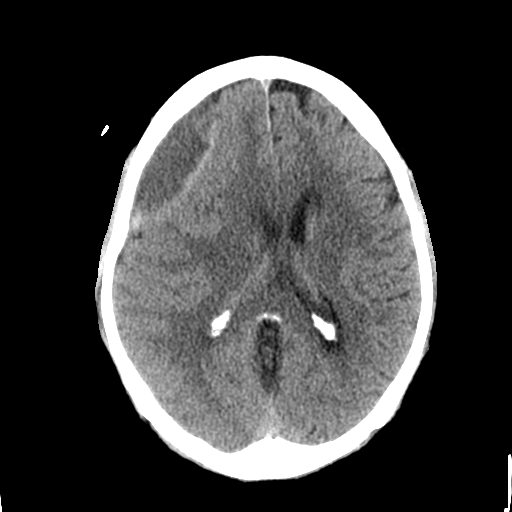
[im 17/29  brain]
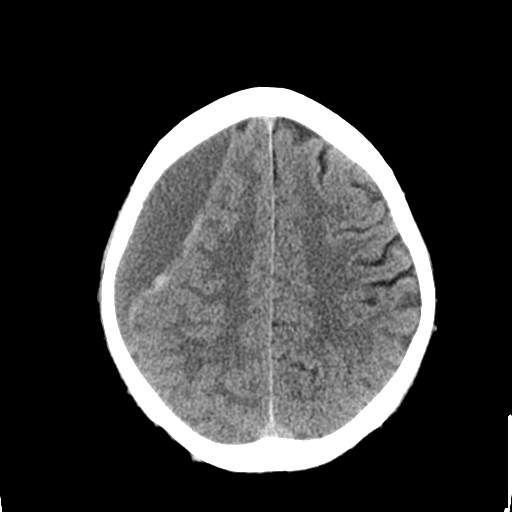
[im 17/29  bone]
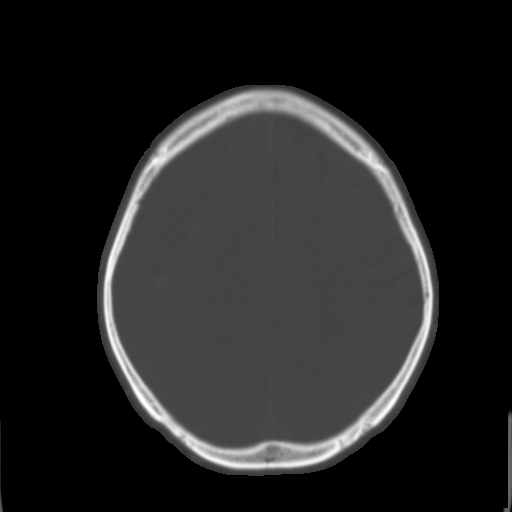
[im 20/29  brain]
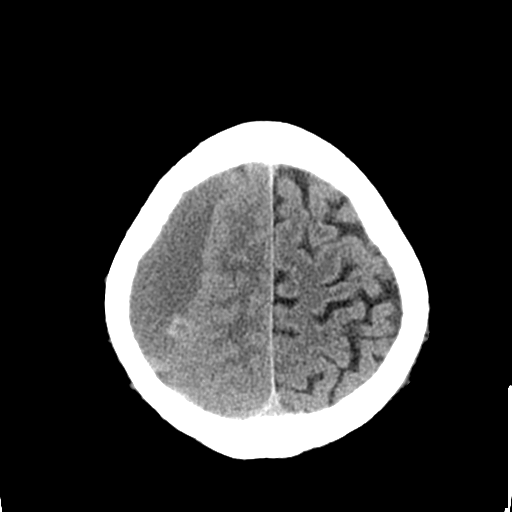
[im 23/29  brain]
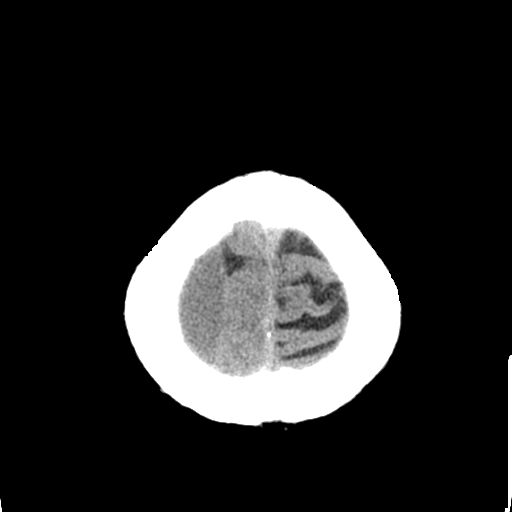
[im 27/29  brain]
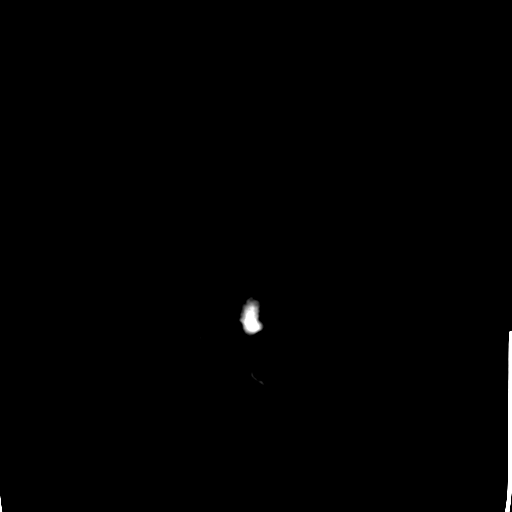

[Series 4: coronal soft tissue · coronal · 0.29mm/px · 3 of 65 slices shown]
[im 22/65  brain]
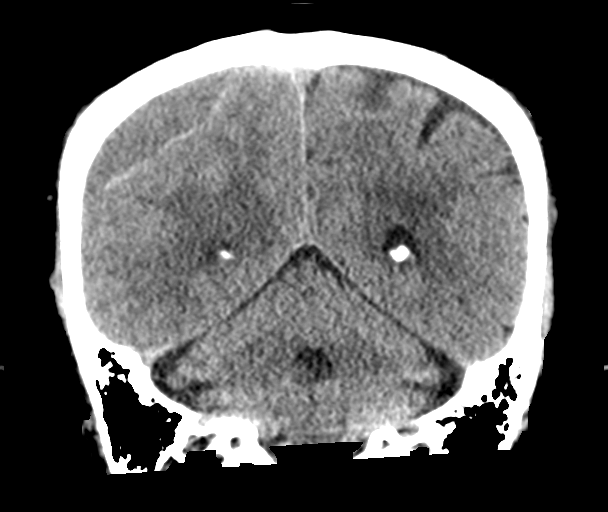
[im 29/65  brain]
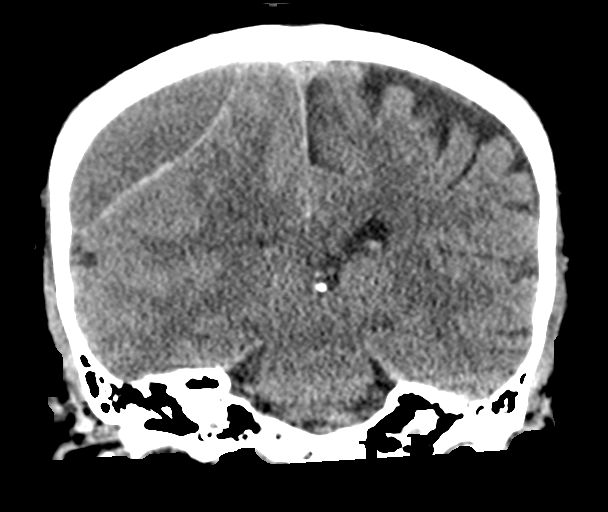
[im 36/65  brain]
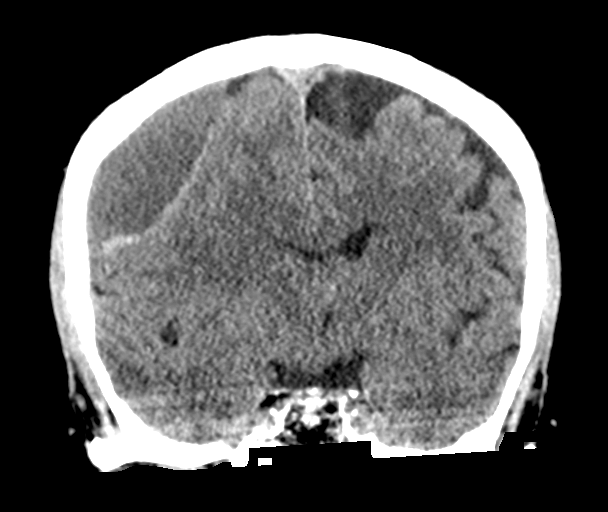

[Series 5: sagittal soft tissue · sagittal · 0.29mm/px · 3 of 56 slices shown]
[im 19/56  brain]
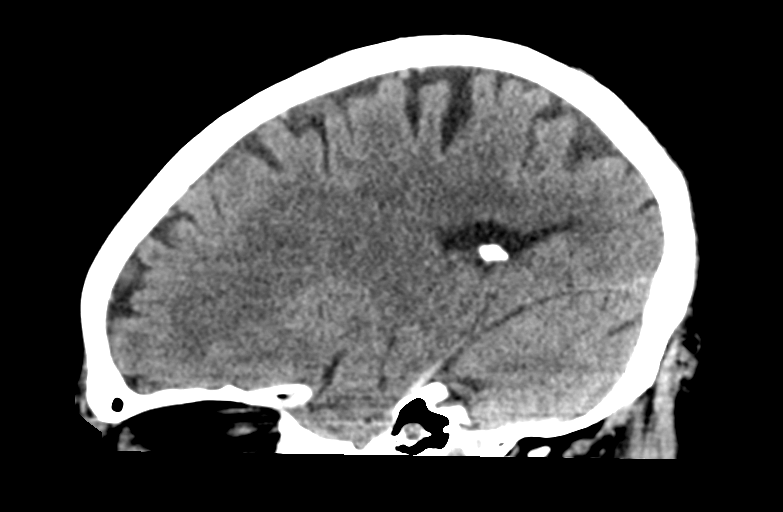
[im 28/56  brain]
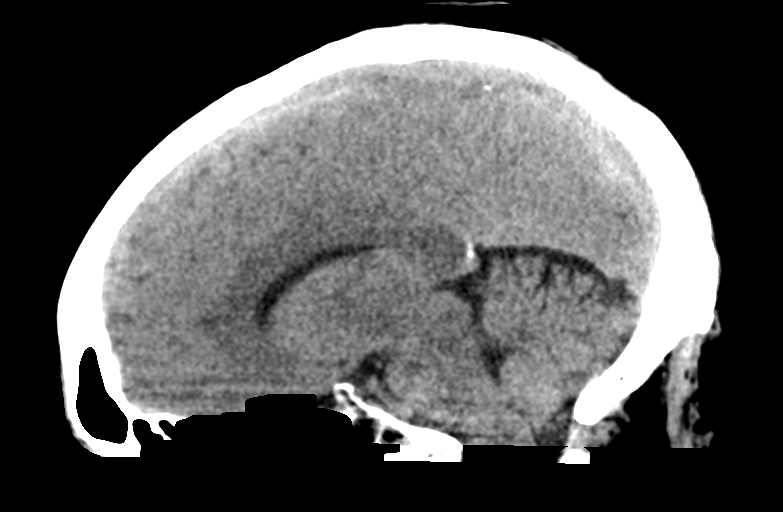
[im 37/56  brain]
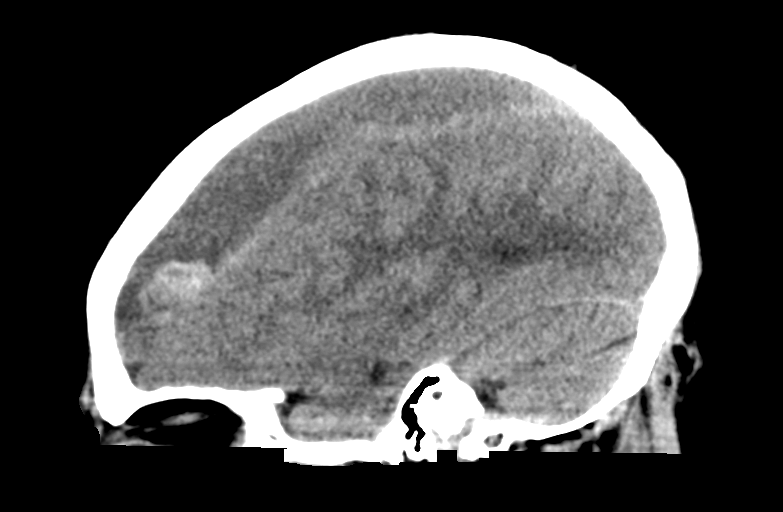

[14 of 46 positions shown; findings below may reference images not displayed]

FINDINGS: Brain: A mixed density subdural fluid collection overlies the right
cerebral convexity measuring up to 2.3 cm in greatest thickness
likely represents an acute on subacute to chronic subdural hematoma.
This exerts mass effect on the right cerebral hemisphere with 7 mm
right to left midline shift at the foramina [place]. The right
lateral ventricle in the third ventricle are compressed due to mass
effect. The basilar cisterns are patent. There is no hydrocephalus.

Vascular: There are vascular calcifications in the carotid siphons.

Skull: Normal. Negative for fracture or focal lesion.

Sinuses/Orbits: There is mild left ethmoid sinus disease.

Other: None.
IMPRESSION: Right cerebral convexity mixed density subdural fluid collection
measuring up to 2.3 cm in greatest thickness likely representing an
acute on subacute to chronic subdural hematoma. This exerts mass
effect on the right cerebral hemisphere with 7 mm right to left
midline shift.

Critical Value/emergent results were called by telephone at the time
of interpretation on 11/29/2018 at [DATE] to Caveda
GATUEL , who verbally acknowledged these results.

## 2020-04-17 ENCOUNTER — Other Ambulatory Visit: Payer: Self-pay

## 2020-04-17 ENCOUNTER — Other Ambulatory Visit: Payer: Medicare Other

## 2020-04-17 ENCOUNTER — Inpatient Hospital Stay: Payer: Medicare Other | Attending: Hematology and Oncology

## 2020-04-17 DIAGNOSIS — Z79899 Other long term (current) drug therapy: Secondary | ICD-10-CM | POA: Insufficient documentation

## 2020-04-17 DIAGNOSIS — N1832 Chronic kidney disease, stage 3b: Secondary | ICD-10-CM

## 2020-04-17 DIAGNOSIS — D500 Iron deficiency anemia secondary to blood loss (chronic): Secondary | ICD-10-CM

## 2020-04-17 DIAGNOSIS — D509 Iron deficiency anemia, unspecified: Secondary | ICD-10-CM | POA: Insufficient documentation

## 2020-04-17 DIAGNOSIS — D631 Anemia in chronic kidney disease: Secondary | ICD-10-CM

## 2020-04-17 LAB — CBC WITH DIFFERENTIAL/PLATELET
Abs Immature Granulocytes: 0.03 10*3/uL (ref 0.00–0.07)
Basophils Absolute: 0.1 10*3/uL (ref 0.0–0.1)
Basophils Relative: 1 %
Eosinophils Absolute: 0.1 10*3/uL (ref 0.0–0.5)
Eosinophils Relative: 2 %
HCT: 33.4 % — ABNORMAL LOW (ref 39.0–52.0)
Hemoglobin: 10.7 g/dL — ABNORMAL LOW (ref 13.0–17.0)
Immature Granulocytes: 1 %
Lymphocytes Relative: 16 %
Lymphs Abs: 1 10*3/uL (ref 0.7–4.0)
MCH: 27.8 pg (ref 26.0–34.0)
MCHC: 32 g/dL (ref 30.0–36.0)
MCV: 86.8 fL (ref 80.0–100.0)
Monocytes Absolute: 0.6 10*3/uL (ref 0.1–1.0)
Monocytes Relative: 9 %
Neutro Abs: 4.2 10*3/uL (ref 1.7–7.7)
Neutrophils Relative %: 71 %
Platelets: 266 10*3/uL (ref 150–400)
RBC: 3.85 MIL/uL — ABNORMAL LOW (ref 4.22–5.81)
RDW: 15.6 % — ABNORMAL HIGH (ref 11.5–15.5)
WBC: 5.9 10*3/uL (ref 4.0–10.5)
nRBC: 0 % (ref 0.0–0.2)

## 2020-04-17 LAB — IRON AND TIBC
Iron: 51 ug/dL (ref 45–182)
Saturation Ratios: 15 % — ABNORMAL LOW (ref 17.9–39.5)
TIBC: 339 ug/dL (ref 250–450)
UIBC: 288 ug/dL

## 2020-04-17 LAB — FERRITIN: Ferritin: 27 ng/mL (ref 24–336)

## 2020-04-17 NOTE — Progress Notes (Signed)
[facility]  [address], [address] [place], [place] [address] Phone: [phone]  Fax: [phone]   Clinic Day:  04/18/20  Referring physician: [facility]  Chief Complaint: Tommy Heath is a 87 y.o. male with iron deficiency anemia who is seen for 6 month assessment.  HPI: The patient was last seen in the hematology clinic on 10/19/2019. At that time, he felt "ok".  He denied any bleeding.  Exam was stable. Hematocrit was 35.3, hemoglobin 11.1, MCV 88.7, platelets 267,000, WBC 6,200. Ferritin was 37 with an iron saturation of 22% and a TIBC of 319.  Labs on 01/17/2020 revealed a hematocrit of 34.0, hemoglobin 11.0, MCV 86.7, platelets 297,000, WBC 6,400. Ferritin was 32.  During the interim, he has been "okay." His diet has been good. He takes oral iron BID which makes his stool dark. He denies constipation and bleeding of any kind. His urinary frequency is stable.   Past Medical History:  Diagnosis Date  . Allergic rhinitis   . Anemia   . AVM (arteriovenous malformation) of colon   . BPH (benign prostatic hyperplasia)   . Cataract cortical, senile   . Colon polyps 02/2005, 2012   TUBULAR ADENOMA  . Diabetes mellitus type 2, uncomplicated ([place])   . Duodenal ulcer    w/ gastric ulcer  . Glaucoma   . H. pylori infection   . H. pylori infection   . History of blood product transfusion   . History of blood transfusion 02/2005   hgb was 6  . History of duodenal ulcer 02/2005   The lesion was 5 mm in largest dimension.  . Hyperlipidemia   . Hypertension   . Sleep apnea    BiPap    Past Surgical History:  Procedure Laterality Date  . COLONOSCOPY     07/2010, 09/09/2002, 10/15/2002, 02/16/2005, Adenomatous polyps, repeat 5 years, Dr Vira Agar  . COLONOSCOPY WITH PROPOFOL N/A 03/01/2015   Procedure: COLONOSCOPY WITH PROPOFOL;  Surgeon: Manya Silvas, MD;  Location: [facility] ENDOSCOPY;  Service: Endoscopy;  Laterality: N/A;  .  ESOPHAGOGASTRODUODENOSCOPY     08/17/2010, 02/16/2005, no repeat per RTE  . ESOPHAGOGASTRODUODENOSCOPY (EGD) WITH PROPOFOL N/A 03/01/2015   Procedure: ESOPHAGOGASTRODUODENOSCOPY (EGD) WITH PROPOFOL;  Surgeon: Manya Silvas, MD;  Location: [facility] ENDOSCOPY;  Service: Endoscopy;  Laterality: N/A;  . ESOPHAGOGASTRODUODENOSCOPY (EGD) WITH PROPOFOL N/A 05/15/2016   Procedure: ESOPHAGOGASTRODUODENOSCOPY (EGD) WITH PROPOFOL;  Surgeon: Manya Silvas, MD;  Location: [facility] ENDOSCOPY;  Service: Endoscopy;  Laterality: N/A;    Family History  Problem Relation Age of Onset  . Diabetes Mother   . Breast cancer Sister   . Prostate cancer Neg Hx   . Bladder Cancer Neg Hx   . Kidney cancer Neg Hx     Social History:  reports that he quit smoking about 27 years ago. His smoking use included cigarettes. He has a 40.00 pack-year smoking history. He has never used smokeless tobacco. He reports that he does not drink alcohol and does not use drugs. 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 He [place]in [place], [place]. He denies any exposure to toxins and radiation. The patient is alone today.  Allergies: No Known Allergies  Current Medications: Current Outpatient Medications  Medication Sig Dispense Refill  . amLODipine (NORVASC) 10 MG tablet Take 10 mg by mouth daily.     . cyanocobalamin 1000 MCG tablet Take 1 tablet by mouth every morning.    . ferrous sulfate 325 (65 FE) MG tablet Take 1  tablet by mouth 3 (three) times daily.    . finasteride (PROSCAR) 5 MG tablet Take 1 tablet (5 mg total) by mouth daily. 90 tablet 3  . glipiZIDE (GLUCOTROL) 10 MG tablet Take 10 mg by mouth daily before breakfast.    . metFORMIN (GLUCOPHAGE) 1000 MG tablet Take 500 mg by mouth 2 (two) times daily with a meal.     . pioglitazone (ACTOS) 30 MG tablet Take 45 mg by mouth daily.     . ramipril (ALTACE) 10 MG capsule Take 10 mg by mouth 2 (two) times daily.     . sitaGLIPtin (JANUVIA) 100 MG tablet Take 100 mg by mouth daily.     .  tamsulosin (FLOMAX) 0.4 MG CAPS capsule Take 0.4 mg by mouth daily.    Marland Kitchen atorvastatin (LIPITOR) 80 MG tablet Take 80 mg by mouth daily. (Patient not taking: Reported on 04/18/2020)    . latanoprost (XALATAN) 0.005 % ophthalmic solution     . simvastatin (ZOCOR) 40 MG tablet Take 40 mg by mouth daily. (Patient not taking: Reported on 04/18/2020)     No current facility-administered medications for this visit.   Facility-Administered Medications Ordered in Other Visits  Medication Dose Route Frequency Provider Last Rate Last Admin  . iron sucrose (VENOFER) injection 200 mg  200 mg Intravenous Once Cammie Sickle, MD        Review of Systems  Constitutional: Positive for weight loss (1 lb). Negative for chills, diaphoresis, fever and malaise/fatigue.       Feels "okay."  HENT: Negative.  Negative for congestion, ear discharge, ear pain, hearing loss, nosebleeds, sinus pain, sore throat and tinnitus.   Eyes: Negative.  Negative for blurred vision.  Respiratory: Negative.  Negative for cough, hemoptysis, sputum production and shortness of breath.   Cardiovascular: Negative.  Negative for chest pain, palpitations and leg swelling.  Gastrointestinal: Positive for melena (on oral iron). Negative for abdominal pain, blood in stool, constipation, diarrhea, heartburn, nausea and vomiting.       Diet is good.  Genitourinary: Positive for frequency (nocturia). Negative for dysuria, hematuria and urgency.  Musculoskeletal: Negative.  Negative for back pain, joint pain, myalgias and neck pain.  Skin: Negative.  Negative for itching and rash.  Neurological: Negative.  Negative for dizziness, tingling, sensory change, weakness and headaches.  Endo/Heme/Allergies: Negative.  Does not bruise/bleed easily.  Psychiatric/Behavioral: Negative.  Negative for depression and memory loss. The patient is not nervous/anxious and does not have insomnia.   All other systems reviewed and are negative.  Performance  status (ECOG): 0  Vitals Blood pressure (!) 146/69, pulse (!) 56, temperature (!) 96.9 F (36.1 C), temperature source Tympanic, resp. rate 16, height 5' 6.5" (1.689 m), weight 186 lb 11.7 oz (84.7 kg), SpO2 100 %.   Physical Exam Vitals and nursing note reviewed.  Constitutional:      General: He is not in acute distress.    Appearance: He is well-developed. He is not diaphoretic.  HENT:     Head: Normocephalic and atraumatic.     Mouth/Throat:     Mouth: Mucous membranes are moist.     Pharynx: Oropharynx is clear. No oropharyngeal exudate.  Eyes:     General: No scleral icterus.    Conjunctiva/sclera: Conjunctivae normal.     Pupils: Pupils are equal, round, and reactive to light.     Comments: Glasses.  Brown eyes.  Neck:     Vascular: No JVD.  Cardiovascular:  Rate and Rhythm: Normal rate and regular rhythm.     Heart sounds: Normal heart sounds. No murmur heard.   Pulmonary:     Effort: Pulmonary effort is normal. No respiratory distress.     Breath sounds: Normal breath sounds. No wheezing or rales.  Chest:  Breasts:     Right: No axillary adenopathy or supraclavicular adenopathy.     Left: No axillary adenopathy or supraclavicular adenopathy.    Abdominal:     General: Bowel sounds are normal. There is no distension.     Palpations: Abdomen is soft. There is no hepatomegaly or splenomegaly.     Tenderness: There is no abdominal tenderness. There is no guarding or rebound.  Musculoskeletal:        General: No tenderness. Normal range of motion.     Cervical back: Normal range of motion and neck supple.     Right lower leg: Edema (1+) present.     Left lower leg: Edema (1+) present.  Lymphadenopathy:     Head:     Right side of head: No preauricular, posterior auricular or occipital adenopathy.     Left side of head: No preauricular or posterior auricular adenopathy.     Cervical: No cervical adenopathy.     Upper Body:     Right upper body: No  supraclavicular or axillary adenopathy.     Left upper body: No supraclavicular or axillary adenopathy.     Lower Body: No right inguinal adenopathy. No left inguinal adenopathy.  Skin:    General: Skin is warm and dry.     Coloration: Skin is not pale.     Findings: No erythema or rash.  Neurological:     Mental Status: He is alert and oriented to person, place, and time.     Deep Tendon Reflexes: Reflexes are normal and symmetric.  Psychiatric:        Behavior: Behavior normal.        Thought Content: Thought content normal.        Judgment: Judgment normal.    Appointment on 04/17/2020  Component Date Value Ref Range Status  . Iron 04/17/2020 51  45 - 182 ug/dL Final  . TIBC 04/17/2020 339  250 - 450 ug/dL Final  . Saturation Ratios 04/17/2020 15* 17.9 - 39.5 % Final  . UIBC 04/17/2020 288  ug/dL Final   Performed at [facility], [address], [place], [place] [address]  . Ferritin 04/17/2020 27  24 - 336 ng/mL Final   Performed at [facility], [address] [place], [place] [address]  . WBC 04/17/2020 5.9  4.0 - 10.5 K/uL Final  . RBC 04/17/2020 3.85* 4.22 - 5.81 MIL/uL Final  . Hemoglobin 04/17/2020 10.7* 13.0 - 17.0 g/dL Final  . HCT 04/17/2020 33.4* 39.0 - 52.0 % Final  . MCV 04/17/2020 86.8  80.0 - 100.0 fL Final  . MCH 04/17/2020 27.8  26.0 - 34.0 pg Final  . MCHC 04/17/2020 32.0  30.0 - 36.0 g/dL Final  . RDW 04/17/2020 15.6* 11.5 - 15.5 % Final  . Platelets 04/17/2020 266  150 - 400 K/uL Final  . nRBC 04/17/2020 0.0  0.0 - 0.2 % Final  . Neutrophils Relative % 04/17/2020 71  % Final  . Neutro Abs 04/17/2020 4.2  1.7 - 7.7 K/uL Final  . Lymphocytes Relative 04/17/2020 16  % Final  . Lymphs Abs 04/17/2020 1.0  0.7 - 4.0 K/uL Final  . Monocytes Relative 04/17/2020 9  %  Final  . Monocytes Absolute 04/17/2020 0.6  0.1 - 1.0 K/uL Final  . Eosinophils Relative 04/17/2020 2  % Final  . Eosinophils Absolute 04/17/2020 0.1  0.0 - 0.5 K/uL Final  .  Basophils Relative 04/17/2020 1  % Final  . Basophils Absolute 04/17/2020 0.1  0.0 - 0.1 K/uL Final  . Immature Granulocytes 04/17/2020 1  % Final  . Abs Immature Granulocytes 04/17/2020 0.03  0.00 - 0.07 K/uL Final   Performed at [facility], [address] [place], [place] [address]    Assessment:  Koy Lamp is a 87 y.o. male withiron deficiency anemia. He was noted to have a history of upper GI bleed secondary to gastric ulcer and duodenal AVM in 2007 and 2012. He received PRBCsin 2007 for a hemoglobin of 6. Dietappears good.  Work-upon 01/10/2017revealed a hematocrit of 28.4, hemoglobin 8.8, MCV 74.6, platelets 314,000, white count 6500. Ferritin was 7 with an iron saturation of 6% and a TIBC of 449. Normal labsincluded: B12, folate, haptoglobin, LDH, SPEP and free light chain ratio.   EGD on 03/01/2015 revealedgastritis and a few nonbleeding angiectasiasin the duodenum treated by argon plasma coagulation (APC).Colonoscopy on 03/01/2015 revealed one diminutive polyp in the descending colon(tubular adenoma). There was a single non-bleeding colonic angioectasiatreated with APC.There was diverticulosis in the sigmoid colon, in the descending colon and in the transverse colon.There were internal hemorrhoids.  He has received Venofer: weekly x 4 (01/24/2015 - 02/14/2015), x 4 (08/01/2015 - 08/23/2015), x 4 (01/23/2016 - 02/13/2016), x4 (07/23/2016 - 08/14/2016), x 5 (01/21/2017 - 02/19/2017), x2 (07/22/2017 - 07/29/2017), 01/20/2018, and x 4 (07/21/2018 - 08/11/2018).  Ferritinhas beenfollowed: 7 on 01/17/2015, 60 on 03/21/2015, 14 on 07/25/2015, 16 on 01/16/2016, 23 on 07/16/2016, 9 on 01/16/2017, 19 on 07/17/2017, 12 on 01/15/2018, 17 on 07/20/2018, 39 on 07/20/2019, 37 on 10/18/2019, 32 on 01/17/2020, and 27 on 04/17/2020.  He has stage IIIb/IV chronic kidney disease. Creatinine was 1.73 (CrCl 26.96 ml/min) on 01/18/2019.  He was admitted to [facility]  on 11/29/2018 - 12/02/2018 with right sided acute on chronic subdural hematoma (SDH). Head CT showed an unchanged right convexity SDH measuring approximately 2.4 cm. He underwent right-sided burr holes for drainage of SDH on 11/30/2018.  Symptomatically, he feels "ok." His diet has been good. He takes oral iron BID; stool is dark. He denies constipation and bleeding of any kind. Exam is stable.  Plan: 1.   Review labs from 04/17/2020 2. Iron deficiency anemia He has received PRBCs in the past for GI bleeding. He has had a gastric ulcer/AVM and angioectasias treated with APC. Diet is good. Hematocrit 33.4. Hemoglobin 10.7. MCV 86.8 on 04/17/2020.             Ferritin 27 with aniron saturation of 15% (low).             He continues oral iron BID.                         Ferritin goal 100. Capsule study deferred secondary to insurance issues, co-morbidities, and COVID-19.   He would require repeat endoscopies within 1 year of VCE.   Send note to GI.  Discuss plan for weekly Venofer x 3 as ferritin drifting down and iron saturation low.  Continue to monitor. 3.Stage IIIb/IV chronic kidney disease             Creatinine 1.73 (CrCl 33.5 ml/min) on 01/18/2019.  He has some component of anemia of chronic renal disease.              Continue to monitor. 4.   Venofer today and weekly x 1 (total 2). 5.   RTC in 2 months for labs (CBC, BMP, ferritin, iron studies). 6.   RTC in 4 months for MD assessment, labs (CBC, ferritin, iron studies- day before) and +/- Venofer.  I discussed the assessment and treatment plan with the patient.  The patient was provided an opportunity to ask questions and all were answered.  The patient agreed with the plan and demonstrated an understanding of the instructions.  The patient was advised to call back if the symptoms worsen or if the condition fails to improve as  anticipated.   Lequita Asal, MD, PhD 04/18/2020, 5:00 PM  I, Mirian Mo Tufford, am acting as Education administrator for [employer]. Mike Gip, MD, PhD.  I, Latoyna Hird C. Mike Gip, MD, have reviewed the above documentation for accuracy and completeness, and I agree with the above.

## 2020-04-18 ENCOUNTER — Other Ambulatory Visit: Payer: Self-pay

## 2020-04-18 ENCOUNTER — Inpatient Hospital Stay (HOSPITAL_BASED_OUTPATIENT_CLINIC_OR_DEPARTMENT_OTHER): Payer: Medicare Other | Admitting: Hematology and Oncology

## 2020-04-18 ENCOUNTER — Encounter: Payer: Self-pay | Admitting: Hematology and Oncology

## 2020-04-18 ENCOUNTER — Inpatient Hospital Stay: Payer: Medicare Other

## 2020-04-18 VITALS — BP 142/60 | HR 58 | Temp 98.5°F | Resp 20

## 2020-04-18 VITALS — BP 146/69 | HR 56 | Temp 96.9°F | Resp 16 | Ht 66.5 in | Wt 186.7 lb

## 2020-04-18 DIAGNOSIS — N1832 Chronic kidney disease, stage 3b: Secondary | ICD-10-CM | POA: Diagnosis not present

## 2020-04-18 DIAGNOSIS — D500 Iron deficiency anemia secondary to blood loss (chronic): Secondary | ICD-10-CM

## 2020-04-18 DIAGNOSIS — D509 Iron deficiency anemia, unspecified: Secondary | ICD-10-CM | POA: Diagnosis not present

## 2020-04-18 DIAGNOSIS — D631 Anemia in chronic kidney disease: Secondary | ICD-10-CM | POA: Diagnosis not present

## 2020-04-18 MED ORDER — IRON SUCROSE 20 MG/ML IV SOLN
200.0000 mg | Freq: Once | INTRAVENOUS | Status: AC
  Administered 2020-04-18: 200 mg via INTRAVENOUS
  Filled 2020-04-18: qty 10

## 2020-04-18 MED ORDER — SODIUM CHLORIDE 0.9 % IV SOLN
Freq: Once | INTRAVENOUS | Status: AC
  Filled 2020-04-18: qty 250

## 2020-04-18 MED ORDER — SODIUM CHLORIDE 0.9 % IV SOLN: 200.0000 mg | Freq: Once | INTRAVENOUS | Status: DC

## 2020-04-26 ENCOUNTER — Inpatient Hospital Stay: Payer: Medicare Other

## 2020-05-01 ENCOUNTER — Ambulatory Visit: Payer: Medicare Other

## 2020-05-02 ENCOUNTER — Inpatient Hospital Stay: Payer: Medicare Other

## 2020-05-02 ENCOUNTER — Other Ambulatory Visit: Payer: Self-pay

## 2020-05-02 VITALS — BP 128/63 | HR 52 | Temp 98.4°F | Resp 18

## 2020-05-02 DIAGNOSIS — D509 Iron deficiency anemia, unspecified: Secondary | ICD-10-CM | POA: Diagnosis not present

## 2020-05-02 DIAGNOSIS — D500 Iron deficiency anemia secondary to blood loss (chronic): Secondary | ICD-10-CM

## 2020-05-02 MED ORDER — SODIUM CHLORIDE 0.9 % IV SOLN: 200.0000 mg | Freq: Once | INTRAVENOUS | Status: DC

## 2020-05-02 MED ORDER — SODIUM CHLORIDE 0.9 % IV SOLN
Freq: Once | INTRAVENOUS | Status: AC
  Filled 2020-05-02: qty 250

## 2020-05-02 MED ORDER — IRON SUCROSE 20 MG/ML IV SOLN
200.0000 mg | Freq: Once | INTRAVENOUS | Status: AC
  Administered 2020-05-02: 200 mg via INTRAVENOUS
  Filled 2020-05-02: qty 10

## 2020-06-21 ENCOUNTER — Other Ambulatory Visit: Payer: Self-pay

## 2020-06-21 ENCOUNTER — Inpatient Hospital Stay: Payer: Medicare Other | Attending: Oncology

## 2020-06-21 DIAGNOSIS — D500 Iron deficiency anemia secondary to blood loss (chronic): Secondary | ICD-10-CM | POA: Diagnosis present

## 2020-06-21 LAB — CBC WITH DIFFERENTIAL/PLATELET
Abs Immature Granulocytes: 0.01 10*3/uL (ref 0.00–0.07)
Basophils Absolute: 0.1 10*3/uL (ref 0.0–0.1)
Basophils Relative: 1 %
Eosinophils Absolute: 0.1 10*3/uL (ref 0.0–0.5)
Eosinophils Relative: 2 %
HCT: 33.4 % — ABNORMAL LOW (ref 39.0–52.0)
Hemoglobin: 10.7 g/dL — ABNORMAL LOW (ref 13.0–17.0)
Immature Granulocytes: 0 %
Lymphocytes Relative: 19 %
Lymphs Abs: 1 10*3/uL (ref 0.7–4.0)
MCH: 28.1 pg (ref 26.0–34.0)
MCHC: 32 g/dL (ref 30.0–36.0)
MCV: 87.7 fL (ref 80.0–100.0)
Monocytes Absolute: 0.5 10*3/uL (ref 0.1–1.0)
Monocytes Relative: 9 %
Neutro Abs: 3.9 10*3/uL (ref 1.7–7.7)
Neutrophils Relative %: 69 %
Platelets: 266 10*3/uL (ref 150–400)
RBC: 3.81 MIL/uL — ABNORMAL LOW (ref 4.22–5.81)
RDW: 15.7 % — ABNORMAL HIGH (ref 11.5–15.5)
WBC: 5.6 10*3/uL (ref 4.0–10.5)
nRBC: 0 % (ref 0.0–0.2)

## 2020-06-21 LAB — BASIC METABOLIC PANEL
Anion gap: 7 (ref 5–15)
BUN: 25 mg/dL — ABNORMAL HIGH (ref 8–23)
CO2: 25 mmol/L (ref 22–32)
Calcium: 8.8 mg/dL — ABNORMAL LOW (ref 8.9–10.3)
Chloride: 106 mmol/L (ref 98–111)
Creatinine, Ser: 1.77 mg/dL — ABNORMAL HIGH (ref 0.61–1.24)
GFR, Estimated: 37 mL/min — ABNORMAL LOW (ref >60)
Glucose, Bld: 115 mg/dL — ABNORMAL HIGH (ref 70–99)
Potassium: 3.3 mmol/L — ABNORMAL LOW (ref 3.5–5.1)
Sodium: 138 mmol/L (ref 135–145)

## 2020-06-22 LAB — IRON AND TIBC
Iron: 45 ug/dL (ref 45–182)
Saturation Ratios: 16 % — ABNORMAL LOW (ref 17.9–39.5)
TIBC: 290 ug/dL (ref 250–450)
UIBC: 245 ug/dL

## 2020-06-22 LAB — FERRITIN: Ferritin: 55 ng/mL (ref 24–336)

## 2020-08-17 ENCOUNTER — Other Ambulatory Visit: Payer: Medicare Other

## 2020-08-21 ENCOUNTER — Ambulatory Visit: Payer: Medicare Other

## 2020-08-21 ENCOUNTER — Ambulatory Visit: Payer: Medicare Other | Admitting: Hematology and Oncology

## 2020-08-22 ENCOUNTER — Inpatient Hospital Stay: Payer: Medicare Other | Attending: Internal Medicine

## 2020-08-22 ENCOUNTER — Other Ambulatory Visit: Payer: Self-pay

## 2020-08-22 DIAGNOSIS — D500 Iron deficiency anemia secondary to blood loss (chronic): Secondary | ICD-10-CM

## 2020-08-22 DIAGNOSIS — N184 Chronic kidney disease, stage 4 (severe): Secondary | ICD-10-CM | POA: Diagnosis not present

## 2020-08-22 DIAGNOSIS — D509 Iron deficiency anemia, unspecified: Secondary | ICD-10-CM | POA: Insufficient documentation

## 2020-08-22 DIAGNOSIS — D631 Anemia in chronic kidney disease: Secondary | ICD-10-CM | POA: Diagnosis not present

## 2020-08-22 DIAGNOSIS — Z87891 Personal history of nicotine dependence: Secondary | ICD-10-CM | POA: Diagnosis not present

## 2020-08-22 LAB — CBC WITH DIFFERENTIAL/PLATELET
Abs Immature Granulocytes: 0.01 10*3/uL (ref 0.00–0.07)
Basophils Absolute: 0.1 10*3/uL (ref 0.0–0.1)
Basophils Relative: 1 %
Eosinophils Absolute: 0.1 10*3/uL (ref 0.0–0.5)
Eosinophils Relative: 3 %
HCT: 33.7 % — ABNORMAL LOW (ref 39.0–52.0)
Hemoglobin: 10.9 g/dL — ABNORMAL LOW (ref 13.0–17.0)
Immature Granulocytes: 0 %
Lymphocytes Relative: 20 %
Lymphs Abs: 1.1 10*3/uL (ref 0.7–4.0)
MCH: 28.4 pg (ref 26.0–34.0)
MCHC: 32.3 g/dL (ref 30.0–36.0)
MCV: 87.8 fL (ref 80.0–100.0)
Monocytes Absolute: 0.6 10*3/uL (ref 0.1–1.0)
Monocytes Relative: 11 %
Neutro Abs: 3.7 10*3/uL (ref 1.7–7.7)
Neutrophils Relative %: 65 %
Platelets: 254 10*3/uL (ref 150–400)
RBC: 3.84 MIL/uL — ABNORMAL LOW (ref 4.22–5.81)
RDW: 15.5 % (ref 11.5–15.5)
WBC: 5.6 10*3/uL (ref 4.0–10.5)
nRBC: 0 % (ref 0.0–0.2)

## 2020-08-22 LAB — IRON AND TIBC
Iron: 61 ug/dL (ref 45–182)
Saturation Ratios: 18 % (ref 17.9–39.5)
TIBC: 339 ug/dL (ref 250–450)
UIBC: 278 ug/dL

## 2020-08-22 LAB — FERRITIN: Ferritin: 42 ng/mL (ref 24–336)

## 2020-08-23 ENCOUNTER — Inpatient Hospital Stay: Payer: Medicare Other

## 2020-08-23 ENCOUNTER — Inpatient Hospital Stay (HOSPITAL_BASED_OUTPATIENT_CLINIC_OR_DEPARTMENT_OTHER): Payer: Medicare Other | Admitting: Oncology

## 2020-08-23 ENCOUNTER — Encounter: Payer: Self-pay | Admitting: Oncology

## 2020-08-23 VITALS — BP 151/63 | HR 56 | Temp 98.3°F | Resp 16 | Ht 66.5 in | Wt 184.7 lb

## 2020-08-23 DIAGNOSIS — D509 Iron deficiency anemia, unspecified: Secondary | ICD-10-CM

## 2020-08-23 NOTE — Progress Notes (Signed)
Hematology/Oncology Consult note [facility]  [phone] [phone]  Patient Care Team: Sindy Guadeloupe, MD as PCP - General (Oncology) Marval Regal, NP as Nurse Practitioner (Nurse Practitioner) Efrain Sella, MD as Consulting Physician (Gastroenterology)   Name of the patient: Tommy Heath  [id number]  Mar 17, 1933   Date of visit: 08/23/20  Diagnosis-anemia likely multifactorial secondary to iron deficiency and anemia of chronic kidney disease  Chief complaint/ Reason for visit-routine follow-up of anemia  Heme/Onc history: Patient is a 87 year old male who is transferring his care from Dr. Mike Gip to me.  He has a history of iron deficiency anemia secondary to gastric ulcer and duodenal AVM in 2007 2012.  Also noted to have nonbleeding colonic angiectasia in 2017 treated with APC.  He has received IV iron in the past.  He also has stage III/stage IV chronic kidney disease.  Interval history-patient reports doing well for his age.  He remains independent of ADLs and IADLs.  Denies any specific complaints at this time.  He is taking oral iron and therefore stools are dark.  Denies any blood loss in his stool or urine  ECOG PS- 1 Pain scale- 0   Review of systems- Review of Systems  Constitutional:  Negative for chills, fever, malaise/fatigue and weight loss.  HENT:  Negative for congestion, ear discharge and nosebleeds.   Eyes:  Negative for blurred vision.  Respiratory:  Negative for cough, hemoptysis, sputum production, shortness of breath and wheezing.   Cardiovascular:  Negative for chest pain, palpitations, orthopnea and claudication.  Gastrointestinal:  Negative for abdominal pain, blood in stool, constipation, diarrhea, heartburn, melena, nausea and vomiting.  Genitourinary:  Negative for dysuria, flank pain, frequency, hematuria and urgency.  Musculoskeletal:  Negative for back pain, joint pain and myalgias.  Skin:   Negative for rash.  Neurological:  Negative for dizziness, tingling, focal weakness, seizures, weakness and headaches.  Endo/Heme/Allergies:  Does not bruise/bleed easily.  Psychiatric/Behavioral:  Negative for depression and suicidal ideas. The patient does not have insomnia.      No Known Allergies   Past Medical History:  Diagnosis Date   Allergic rhinitis    Anemia    AVM (arteriovenous malformation) of colon    BPH (benign prostatic hyperplasia)    Cataract cortical, senile    Colon polyps 02/2005, 2012   TUBULAR ADENOMA   Diabetes mellitus type 2, uncomplicated ([facility])    Duodenal ulcer    w/ gastric ulcer   Glaucoma    H. pylori infection    H. pylori infection    History of blood product transfusion    History of blood transfusion 02/2005   hgb was 6   History of duodenal ulcer 02/2005   The lesion was 5 mm in largest dimension.   Hyperlipidemia    Hypertension    Sleep apnea    BiPap     Past Surgical History:  Procedure Laterality Date   COLONOSCOPY     07/2010, 09/09/2002, 10/15/2002, 02/16/2005, Adenomatous polyps, repeat 5 years, Dr Vira Agar   COLONOSCOPY WITH PROPOFOL N/A 03/01/2015   Procedure: COLONOSCOPY WITH PROPOFOL;  Surgeon: Manya Silvas, MD;  Location: [place];  Service: Endoscopy;  Laterality: N/A;   ESOPHAGOGASTRODUODENOSCOPY     08/17/2010, 02/16/2005, no repeat per RTE   ESOPHAGOGASTRODUODENOSCOPY (EGD) WITH PROPOFOL N/A 03/01/2015   Procedure: ESOPHAGOGASTRODUODENOSCOPY (EGD) WITH PROPOFOL;  Surgeon: Manya Silvas, MD;  Location: [facility] ENDOSCOPY;  Service: Endoscopy;  Laterality: N/A;  ESOPHAGOGASTRODUODENOSCOPY (EGD) WITH PROPOFOL N/A 05/15/2016   Procedure: ESOPHAGOGASTRODUODENOSCOPY (EGD) WITH PROPOFOL;  Surgeon: Manya Silvas, MD;  Location[facility] ENDOSCOPY;  Service: Endoscopy;  Laterality: N/A;    Social History   Socioeconomic History   Marital status: Single    Spouse name: Not on file   Number of children: Not on file   Years  of education: Not on file   Highest education level: Not on file  Occupational History   Not on file  Tobacco Use   Smoking status: Former    Packs/day: 1.00    Years: 40.00    Pack years: 40.00    Types: Cigarettes    Quit date: 01/07/1993    Years since quitting: 27.6   Smokeless tobacco: Never  Vaping Use   Vaping Use: Never used  Substance and Sexual Activity   Alcohol use: No    Alcohol/week: 0.0 standard drinks   Drug use: No   Sexual activity: Never  Other Topics Concern   Not on file  Social History Narrative   Not on file   Social Determinants of Health   Financial Resource Strain: Not on file  Food Insecurity: Not on file  Transportation Needs: Not on file  Physical Activity: Not on file  Stress: Not on file  Social Connections: Not on file  Intimate Partner Violence: Not on file    Family History  Problem Relation Age of Onset   Diabetes Mother    Breast cancer Sister    Prostate cancer Neg Hx    Bladder Cancer Neg Hx    Kidney cancer Neg Hx      Current Outpatient Medications:    amLODipine (NORVASC) 10 MG tablet, Take 10 mg by mouth daily. , Disp: , Rfl:    cyanocobalamin 1000 MCG tablet, Take 1 tablet by mouth every morning., Disp: , Rfl:    ferrous sulfate 325 (65 FE) MG tablet, Take 1 tablet by mouth 2 (two) times daily with a meal., Disp: , Rfl:    finasteride (PROSCAR) 5 MG tablet, Take 1 tablet (5 mg total) by mouth daily., Disp: 90 tablet, Rfl: 3   glipiZIDE (GLUCOTROL) 10 MG tablet, Take 10 mg by mouth daily before breakfast., Disp: , Rfl:    latanoprost (XALATAN) 0.005 % ophthalmic solution, Place 1 drop into both eyes at bedtime., Disp: , Rfl:    metFORMIN (GLUCOPHAGE) 1000 MG tablet, Take 500 mg by mouth 2 (two) times daily with a meal. , Disp: , Rfl:    pioglitazone (ACTOS) 30 MG tablet, Take 45 mg by mouth daily. , Disp: , Rfl:    ramipril (ALTACE) 10 MG capsule, Take 10 mg by mouth daily., Disp: , Rfl:    sitaGLIPtin (JANUVIA) 100 MG  tablet, Take 100 mg by mouth daily. , Disp: , Rfl:    tamsulosin (FLOMAX) 0.4 MG CAPS capsule, Take 0.4 mg by mouth daily., Disp: , Rfl:  No current facility-administered medications for this visit.  Facility-Administered Medications Ordered in Other Visits:    iron sucrose (VENOFER) injection 200 mg, 200 mg, Intravenous, Once, Cammie Sickle, MD  Physical exam:  Vitals:   08/23/20 1316 08/23/20 1322  BP:  (!) 151/63  Pulse:  (!) 56  Resp:  16  Temp:  98.3 F (36.8 C)  TempSrc:  Oral  Weight: 184 lb 11.9 oz (83.8 kg) 184 lb 11.9 oz (83.8 kg)  Height:  5' 6.5" (1.689 m)   Physical Exam Cardiovascular:     Rate  and Rhythm: Normal rate and regular rhythm.     Heart sounds: Normal heart sounds.  Pulmonary:     Effort: Pulmonary effort is normal.     Breath sounds: Normal breath sounds.  Abdominal:     General: Bowel sounds are normal.     Palpations: Abdomen is soft.  Skin:    General: Skin is warm and dry.  Neurological:     Mental Status: He is alert and oriented to person, place, and time.     CMP Latest Ref Rng & Units 06/21/2020  Glucose 70 - 99 mg/dL 115(H)  BUN 8 - 23 mg/dL 25(H)  Creatinine 0.61 - 1.24 mg/dL 1.77(H)  Sodium 135 - 145 mmol/L 138  Potassium 3.5 - 5.1 mmol/L 3.3(L)  Chloride 98 - 111 mmol/L 106  CO2 22 - 32 mmol/L 25  Calcium 8.9 - 10.3 mg/dL 8.8(L)  Total Protein 6.5 - 8.1 g/dL -  Total Bilirubin 0.3 - 1.2 mg/dL -  Alkaline Phos 38 - 126 U/L -  AST 15 - 41 U/L -  ALT 0 - 44 U/L -   CBC Latest Ref Rng & Units 08/22/2020  WBC 4.0 - 10.5 K/uL 5.6  Hemoglobin 13.0 - 17.0 g/dL 10.9(L)  Hematocrit 39.0 - 52.0 % 33.7(L)  Platelets 150 - 400 K/uL 254    No images are attached to the encounter.  No results found.   Assessment and plan- Patient is a 87 y.o. male with history of iron deficiency anemia and anemia of chronic kidney disease here for routine follow-up  Patient's hemoglobin has remained stable between 10-11 for the last 4 years.   Today his ferritin levels are 42 with an iron saturation of 18%.  Given the stability of his hemoglobin I will hold off on giving him any IV iron at this time although his iron saturation is less than 20%.  Repeat CBC ferritin and iron studies in 3 months in 6 months and I will see him back in 6 months.  I will also check B12 and CMP in 3 months   Visit Diagnosis 1. Iron deficiency anemia, unspecified iron deficiency anemia type      Dr. Randa Evens, MD, MPH [facility] [phone] 08/23/2020 3:53 PM

## 2020-08-23 NOTE — Progress Notes (Signed)
He is doing ok, no c/o

## 2020-11-23 ENCOUNTER — Other Ambulatory Visit: Payer: Self-pay

## 2020-11-23 ENCOUNTER — Inpatient Hospital Stay: Payer: Medicare Other | Attending: Oncology

## 2020-11-23 DIAGNOSIS — D509 Iron deficiency anemia, unspecified: Secondary | ICD-10-CM | POA: Diagnosis present

## 2020-11-23 LAB — IRON AND TIBC
Iron: 67 ug/dL (ref 45–182)
Saturation Ratios: 19 % (ref 17.9–39.5)
TIBC: 350 ug/dL (ref 250–450)
UIBC: 283 ug/dL

## 2020-11-23 LAB — CBC WITH DIFFERENTIAL/PLATELET
Abs Immature Granulocytes: 0.04 10*3/uL (ref 0.00–0.07)
Basophils Absolute: 0.1 10*3/uL (ref 0.0–0.1)
Basophils Relative: 1 %
Eosinophils Absolute: 0.1 10*3/uL (ref 0.0–0.5)
Eosinophils Relative: 2 %
HCT: 33.2 % — ABNORMAL LOW (ref 39.0–52.0)
Hemoglobin: 10.5 g/dL — ABNORMAL LOW (ref 13.0–17.0)
Immature Granulocytes: 1 %
Lymphocytes Relative: 24 %
Lymphs Abs: 1.3 10*3/uL (ref 0.7–4.0)
MCH: 27.9 pg (ref 26.0–34.0)
MCHC: 31.6 g/dL (ref 30.0–36.0)
MCV: 88.3 fL (ref 80.0–100.0)
Monocytes Absolute: 0.6 10*3/uL (ref 0.1–1.0)
Monocytes Relative: 11 %
Neutro Abs: 3.3 10*3/uL (ref 1.7–7.7)
Neutrophils Relative %: 61 %
Platelets: 259 10*3/uL (ref 150–400)
RBC: 3.76 MIL/uL — ABNORMAL LOW (ref 4.22–5.81)
RDW: 15.4 % (ref 11.5–15.5)
WBC: 5.4 10*3/uL (ref 4.0–10.5)
nRBC: 0 % (ref 0.0–0.2)

## 2020-11-23 LAB — FERRITIN: Ferritin: 33 ng/mL (ref 24–336)

## 2021-02-14 ENCOUNTER — Other Ambulatory Visit: Payer: Self-pay | Admitting: *Deleted

## 2021-02-14 DIAGNOSIS — D509 Iron deficiency anemia, unspecified: Secondary | ICD-10-CM

## 2021-02-21 ENCOUNTER — Inpatient Hospital Stay: Payer: Medicare Other

## 2021-02-21 ENCOUNTER — Other Ambulatory Visit: Payer: Medicare Other

## 2021-02-21 ENCOUNTER — Ambulatory Visit: Payer: Medicare Other | Admitting: Oncology

## 2021-02-21 ENCOUNTER — Other Ambulatory Visit
Admission: RE | Admit: 2021-02-21 | Discharge: 2021-02-21 | Disposition: A | Discharge status: Home / Self Care | Payer: Medicare Other | Attending: Oncology | Admitting: Oncology

## 2021-02-21 ENCOUNTER — Inpatient Hospital Stay: Payer: Medicare Other | Admitting: Oncology

## 2021-02-21 DIAGNOSIS — D509 Iron deficiency anemia, unspecified: Secondary | ICD-10-CM | POA: Insufficient documentation

## 2021-02-21 LAB — CBC WITH DIFFERENTIAL/PLATELET
Abs Immature Granulocytes: 0.02 10*3/uL (ref 0.00–0.07)
Basophils Absolute: 0.1 10*3/uL (ref 0.0–0.1)
Basophils Relative: 1 %
Eosinophils Absolute: 0.2 10*3/uL (ref 0.0–0.5)
Eosinophils Relative: 3 %
HCT: 33.3 % — ABNORMAL LOW (ref 39.0–52.0)
Hemoglobin: 10.3 g/dL — ABNORMAL LOW (ref 13.0–17.0)
Immature Granulocytes: 0 %
Lymphocytes Relative: 23 %
Lymphs Abs: 1.4 10*3/uL (ref 0.7–4.0)
MCH: 27.2 pg (ref 26.0–34.0)
MCHC: 30.9 g/dL (ref 30.0–36.0)
MCV: 88.1 fL (ref 80.0–100.0)
Monocytes Absolute: 0.6 10*3/uL (ref 0.1–1.0)
Monocytes Relative: 10 %
Neutro Abs: 3.6 10*3/uL (ref 1.7–7.7)
Neutrophils Relative %: 63 %
Platelets: 238 10*3/uL (ref 150–400)
RBC: 3.78 MIL/uL — ABNORMAL LOW (ref 4.22–5.81)
RDW: 15.3 % (ref 11.5–15.5)
WBC: 5.8 10*3/uL (ref 4.0–10.5)
nRBC: 0 % (ref 0.0–0.2)

## 2021-02-21 LAB — IRON AND TIBC
Iron: 64 ug/dL (ref 45–182)
Saturation Ratios: 19 % (ref 17.9–39.5)
TIBC: 332 ug/dL (ref 250–450)
UIBC: 268 ug/dL

## 2021-02-21 LAB — FERRITIN: Ferritin: 29 ng/mL (ref 24–336)

## 2021-03-01 ENCOUNTER — Inpatient Hospital Stay: Payer: Medicare Other | Attending: Oncology | Admitting: Oncology

## 2021-03-01 ENCOUNTER — Encounter: Payer: Self-pay | Admitting: Oncology

## 2021-03-01 ENCOUNTER — Other Ambulatory Visit: Payer: Self-pay

## 2021-03-01 VITALS — BP 156/72 | HR 60 | Temp 97.2°F | Resp 16 | Ht 66.5 in | Wt 183.0 lb

## 2021-03-01 DIAGNOSIS — Z86010 Personal history of colonic polyps: Secondary | ICD-10-CM | POA: Insufficient documentation

## 2021-03-01 DIAGNOSIS — Z803 Family history of malignant neoplasm of breast: Secondary | ICD-10-CM | POA: Insufficient documentation

## 2021-03-01 DIAGNOSIS — D509 Iron deficiency anemia, unspecified: Secondary | ICD-10-CM | POA: Insufficient documentation

## 2021-03-01 DIAGNOSIS — D631 Anemia in chronic kidney disease: Secondary | ICD-10-CM | POA: Insufficient documentation

## 2021-03-01 DIAGNOSIS — Z87891 Personal history of nicotine dependence: Secondary | ICD-10-CM | POA: Insufficient documentation

## 2021-03-01 DIAGNOSIS — N189 Chronic kidney disease, unspecified: Secondary | ICD-10-CM | POA: Diagnosis present

## 2021-03-01 NOTE — Progress Notes (Signed)
Hematology/Oncology Consult note [facility]  Telephone:([phone] [phone]  Patient Care Team: Sindy Guadeloupe, MD as PCP - General (Oncology) Marval Regal, NP as Nurse Practitioner (Nurse Practitioner) Efrain Sella, MD as Consulting Physician (Gastroenterology)   Name of the patient: Tommy Heath Heath  [id number]  Jun 17, 1933   Date of visit: 03/01/21  Diagnosis-anemia likely multifactorial secondary to iron deficiency and anemia of chronic kidney disease  Chief complaint/ Reason for visit-routine follow-up of anemia  Heme/Onc history: Patient is a 88 year old male who is transferring his care from Dr. Mike Gip to me.  He has a history of iron deficiency anemia secondary to gastric ulcer and duodenal AVM in 2007 2012.  Also noted to have nonbleeding colonic angiectasia in 2017 treated with APC.  He has received IV iron in the past.  He also has stage III/stage IV chronic kidney disease.  Interval history-[name] reports feeling fairly well.  Reports some decreased energy levels and increasing fatigue over the past few months.  Denies any bleeding.  Last received IV Venofer on 04/18/2020 and 05/02/2020.  He is currently on iron supplements twice daily and tolerating well.  ECOG PS- 1 Pain scale- 0   Review of systems- Review of Systems  Constitutional:  Positive for malaise/fatigue. Negative for chills, fever and weight loss.  HENT:  Negative for congestion, ear pain and tinnitus.   Eyes: Negative.  Negative for blurred vision and double vision.  Respiratory: Negative.  Negative for cough, sputum production and shortness of breath.   Cardiovascular: Negative.  Negative for chest pain, palpitations and leg swelling.  Gastrointestinal: Negative.  Negative for abdominal pain, constipation, diarrhea, nausea and vomiting.  Genitourinary:  Negative for dysuria, frequency and urgency.  Musculoskeletal:  Negative for back pain and falls.  Skin:  Negative.  Negative for rash.  Neurological:  Positive for weakness. Negative for headaches.  Endo/Heme/Allergies: Negative.  Does not bruise/bleed easily.  Psychiatric/Behavioral: Negative.  Negative for depression. The patient is not nervous/anxious and does not have insomnia.      No Known Allergies   Past Medical History:  Diagnosis Date   Allergic rhinitis    Anemia    AVM (arteriovenous malformation) of colon    BPH (benign prostatic hyperplasia)    Cataract cortical, senile    Colon polyps 02/2005, 2012   TUBULAR ADENOMA   Diabetes mellitus type 2, uncomplicated (HCC)    Duodenal ulcer    w/ gastric ulcer   Glaucoma    H. pylori infection    H. pylori infection    History of blood product transfusion    History of blood transfusion 02/2005   hgb was 6   History of duodenal ulcer 02/2005   The lesion was 5 mm in largest dimension.   Hyperlipidemia    Hypertension    Sleep apnea    BiPap     Past Surgical History:  Procedure Laterality Date   COLONOSCOPY     07/2010, 09/09/2002, 10/15/2002, 02/16/2005, Adenomatous polyps, repeat 5 years, Dr Vira Agar   COLONOSCOPY WITH PROPOFOL N/A 03/01/2015   Procedure: COLONOSCOPY WITH PROPOFOL;  Surgeon: Manya Silvas, MD;  Location: [place];  Service: Endoscopy;  Laterality: N/A;   ESOPHAGOGASTRODUODENOSCOPY     08/17/2010, 02/16/2005, no repeat per RTE   ESOPHAGOGASTRODUODENOSCOPY (EGD) WITH PROPOFOL N/A 03/01/2015   Procedure: ESOPHAGOGASTRODUODENOSCOPY (EGD) WITH PROPOFOL;  Surgeon: Manya Silvas, MD;  Location: [facility] ENDOSCOPY;  Service: Endoscopy;  Laterality: N/A;   ESOPHAGOGASTRODUODENOSCOPY (  EGD) WITH PROPOFOL N/A 05/15/2016   Procedure: ESOPHAGOGASTRODUODENOSCOPY (EGD) WITH PROPOFOL;  Surgeon: Manya Silvas, MD;  Location: [facility] ENDOSCOPY;  Service: Endoscopy;  Laterality: N/A;    Social History   Socioeconomic History   Marital status: Single    Spouse name: Not on file   Number of children: Not on file    Years of education: Not on file   Highest education level: Not on file  Occupational History   Not on file  Tobacco Use   Smoking status: Former    Packs/day: 1.00    Years: 40.00    Pack years: 40.00    Types: Cigarettes    Quit date: 01/07/1993    Years since quitting: 28.1   Smokeless tobacco: Never  Vaping Use   Vaping Use: Never used  Substance and Sexual Activity   Alcohol use: No    Alcohol/week: 0.0 standard drinks   Drug use: No   Sexual activity: Never  Other Topics Concern   Not on file  Social History Narrative   Not on file   Social Determinants of Health   Financial Resource Strain: Not on file  Food Insecurity: Not on file  Transportation Needs: Not on file  Physical Activity: Not on file  Stress: Not on file  Social Connections: Not on file  Intimate Partner Violence: Not on file    Family History  Problem Relation Age of Onset   Diabetes Mother    Breast cancer Sister    Prostate cancer Neg Hx    Bladder Cancer Neg Hx    Kidney cancer Neg Hx      Current Outpatient Medications:    amLODipine (NORVASC) 10 MG tablet, Take 10 mg by mouth daily. , Disp: , Rfl:    atenolol (TENORMIN) 50 MG tablet, TAKE ONE TABLET BY MOUTH EVERY MORNING FOR BLOOD PRESSURE, Disp: , Rfl:    cyanocobalamin 1000 MCG tablet, Take 1 tablet by mouth every morning., Disp: , Rfl:    ferrous sulfate 325 (65 FE) MG tablet, Take 1 tablet by mouth 2 (two) times daily with a meal., Disp: , Rfl:    finasteride (PROSCAR) 5 MG tablet, Take 1 tablet (5 mg total) by mouth daily., Disp: 90 tablet, Rfl: 3   glipiZIDE (GLUCOTROL) 10 MG tablet, Take 10 mg by mouth daily before breakfast., Disp: , Rfl:    latanoprost (XALATAN) 0.005 % ophthalmic solution, Place 1 drop into both eyes at bedtime., Disp: , Rfl:    metFORMIN (GLUCOPHAGE) 1000 MG tablet, Take 500 mg by mouth 2 (two) times daily with a meal. , Disp: , Rfl:    pioglitazone (ACTOS) 30 MG tablet, Take 45 mg by mouth daily. , Disp: ,  Rfl:    ramipril (ALTACE) 10 MG capsule, Take 10 mg by mouth daily., Disp: , Rfl:    sitaGLIPtin (JANUVIA) 100 MG tablet, Take 100 mg by mouth daily. , Disp: , Rfl:    tamsulosin (FLOMAX) 0.4 MG CAPS capsule, Take 0.4 mg by mouth daily., Disp: , Rfl:  No current facility-administered medications for this visit.  Facility-Administered Medications Ordered in Other Visits:    iron sucrose (VENOFER) injection 200 mg, 200 mg, Intravenous, Once, Cammie Sickle, MD  Physical exam:  Vitals:   03/01/21 1424  BP: (!) 156/72  Pulse: 60  Resp: 16  Temp: (!) 97.2 F (36.2 C)  TempSrc: Tympanic  SpO2: 100%  Weight: 183 lb (83 kg)  Height: 5' 6.5" (1.689 m)   Physical  Exam Constitutional:      Appearance: Normal appearance.  HENT:     Head: Normocephalic and atraumatic.  Eyes:     Pupils: Pupils are equal, round, and reactive to light.  Cardiovascular:     Rate and Rhythm: Normal rate and regular rhythm.     Heart sounds: Normal heart sounds. No murmur heard. Pulmonary:     Effort: Pulmonary effort is normal.     Breath sounds: Normal breath sounds. No wheezing.  Abdominal:     General: Bowel sounds are normal. There is no distension.     Palpations: Abdomen is soft.     Tenderness: There is no abdominal tenderness.  Musculoskeletal:        General: Normal range of motion.     Cervical back: Normal range of motion.  Skin:    General: Skin is warm and dry.     Findings: No rash.  Neurological:     Mental Status: He is alert and oriented to person, place, and time.  Psychiatric:        Judgment: Judgment normal.     CMP Latest Ref Rng & Units 06/21/2020  Glucose 70 - 99 mg/dL 115(H)  BUN 8 - 23 mg/dL 25(H)  Creatinine 0.61 - 1.24 mg/dL 1.77(H)  Sodium 135 - 145 mmol/L 138  Potassium 3.5 - 5.1 mmol/L 3.3(L)  Chloride 98 - 111 mmol/L 106  CO2 22 - 32 mmol/L 25  Calcium 8.9 - 10.3 mg/dL 8.8(L)  Total Protein 6.5 - 8.1 g/dL -  Total Bilirubin 0.3 - 1.2 mg/dL -  Alkaline  Phos 38 - 126 U/L -  AST 15 - 41 U/L -  ALT 0 - 44 U/L -   CBC Latest Ref Rng & Units 02/21/2021  WBC 4.0 - 10.5 K/uL 5.8  Hemoglobin 13.0 - 17.0 g/dL 10.3(L)  Hematocrit 39.0 - 52.0 % 33.3(L)  Platelets 150 - 400 K/uL 238    No images are attached to the encounter.  No results found.   Assessment and plan- Patient is a 88 y.o. male with history of iron deficiency anemia and anemia of chronic kidney disease here for routine follow-up.  Reviewed labs from 02/22/2020 which show a hemoglobin of 10.3 (10.5), iron saturation 19% and ferritin of 29 (33) TIBC 332.  Discussed with patient IV iron given slight decline in ferritin and hemoglobin.  We will get him scheduled for 2 doses of IV Venofer and have him return in 4 months.  Disposition-return to clinic in 4 months for follow-up with lab work (ferritin, iron panel, vitamin B12 and CBC), MD assessment and possible IV iron.  I spent 15 minutes dedicated to the care of this patient (face-to-face and non-face-to-face) on the date of the encounter to include what is described in the assessment and plan.  Visit Diagnosis 1. Iron deficiency anemia, unspecified iron deficiency anemia type    Faythe Casa, NP 03/01/2021 3:57 PM

## 2021-03-05 ENCOUNTER — Other Ambulatory Visit: Payer: Self-pay

## 2021-03-05 ENCOUNTER — Inpatient Hospital Stay: Payer: Medicare Other

## 2021-03-05 VITALS — BP 153/72 | HR 65 | Temp 97.6°F | Resp 16

## 2021-03-05 DIAGNOSIS — D500 Iron deficiency anemia secondary to blood loss (chronic): Secondary | ICD-10-CM

## 2021-03-05 DIAGNOSIS — D509 Iron deficiency anemia, unspecified: Secondary | ICD-10-CM | POA: Diagnosis not present

## 2021-03-05 MED ORDER — SODIUM CHLORIDE 0.9 % IV SOLN: 200.0000 mg | Freq: Once | INTRAVENOUS | Status: DC

## 2021-03-05 MED ORDER — SODIUM CHLORIDE 0.9 % IV SOLN
Freq: Once | INTRAVENOUS | Status: AC
  Filled 2021-03-05: qty 250

## 2021-03-05 MED ORDER — IRON SUCROSE 20 MG/ML IV SOLN
200.0000 mg | Freq: Once | INTRAVENOUS | Status: AC
  Administered 2021-03-05: 200 mg via INTRAVENOUS
  Filled 2021-03-05: qty 10

## 2021-03-05 NOTE — Patient Instructions (Signed)

## 2021-03-12 ENCOUNTER — Other Ambulatory Visit: Payer: Self-pay

## 2021-03-12 ENCOUNTER — Inpatient Hospital Stay: Payer: Medicare Other | Attending: Oncology

## 2021-03-12 VITALS — BP 182/89 | HR 64 | Temp 96.9°F | Resp 18

## 2021-03-12 DIAGNOSIS — D509 Iron deficiency anemia, unspecified: Secondary | ICD-10-CM | POA: Insufficient documentation

## 2021-03-12 DIAGNOSIS — D500 Iron deficiency anemia secondary to blood loss (chronic): Secondary | ICD-10-CM

## 2021-03-12 MED ORDER — SODIUM CHLORIDE 0.9 % IV SOLN: 200.0000 mg | Freq: Once | INTRAVENOUS | Status: DC

## 2021-03-12 MED ORDER — SODIUM CHLORIDE 0.9 % IV SOLN
Freq: Once | INTRAVENOUS | Status: AC
  Filled 2021-03-12: qty 250

## 2021-03-12 MED ORDER — IRON SUCROSE 20 MG/ML IV SOLN
200.0000 mg | Freq: Once | INTRAVENOUS | Status: AC
  Administered 2021-03-12: 200 mg via INTRAVENOUS
  Filled 2021-03-12: qty 10

## 2021-03-12 NOTE — Patient Instructions (Signed)
[facility] [place]  Discharge Instructions: ?Thank you for choosing [facility] to provide your oncology and hematology care.  ?If you have a lab appointment with the Cancer Center, please go directly to the Cancer Center and check in at the registration area. ? ?Wear comfortable clothing and clothing appropriate for easy access to any Portacath or PICC line.  ? ?We strive to give you quality time with your provider. You may need to reschedule your appointment if you arrive late (15 or more minutes).  Arriving late affects you and other patients whose appointments are after yours.  Also, if you miss three or more appointments without notifying the office, you may be dismissed from the clinic at the provider?s discretion.    ?  ?For prescription refill requests, have your pharmacy contact our office and allow 72 hours for refills to be completed.   ? ?Today you received the following chemotherapy and/or immunotherapy agents VENOFER    ?  ?To help prevent nausea and vomiting after your treatment, we encourage you to take your nausea medication as directed. ? ?BELOW ARE SYMPTOMS THAT SHOULD BE REPORTED IMMEDIATELY: ?*FEVER GREATER THAN 100.4 F (38 ?C) OR HIGHER ?*CHILLS OR SWEATING ?*NAUSEA AND VOMITING THAT IS NOT CONTROLLED WITH YOUR NAUSEA MEDICATION ?*UNUSUAL SHORTNESS OF BREATH ?*UNUSUAL BRUISING OR BLEEDING ?*URINARY PROBLEMS (pain or burning when urinating, or frequent urination) ?*BOWEL PROBLEMS (unusual diarrhea, constipation, pain near the anus) ?TENDERNESS IN MOUTH AND THROAT WITH OR WITHOUT PRESENCE OF ULCERS (sore throat, sores in mouth, or a toothache) ?UNUSUAL RASH, SWELLING OR PAIN  ?UNUSUAL VAGINAL DISCHARGE OR ITCHING  ? ?Items with * indicate a potential emergency and should be followed up as soon as possible or go to the Emergency Department if any problems should occur. ? ?Please show the CHEMOTHERAPY ALERT CARD or IMMUNOTHERAPY ALERT CARD at check-in to the  Emergency Department and triage nurse. ? ?Should you have questions after your visit or need to cancel or reschedule your appointment, please contact [facility] [place]  [phone] and follow the prompts.  Office hours are 8:00 a.m. to 4:30 p.m. Monday - Friday. Please note that voicemails left after 4:00 p.m. may not be returned until the following business day.  We are closed weekends and major holidays. You have access to a nurse at all times for urgent questions. Please call the main number to the clinic [phone] and follow the prompts. ? ?For any non-urgent questions, you may also contact your provider using MyChart. We now offer e-Visits for anyone 18 and older to request care online for non-urgent symptoms. For details visit [email or web address]. ?  ?Also download the MyChart app! Go to the app store, search "MyChart", open the app, select [place], and log in with your MyChart username and password. ? ?Due to Covid, a mask is required upon entering the hospital/clinic. If you do not have a mask, one will be given to you upon arrival. For doctor visits, patients may have 1 support person aged 18 or older with them. For treatment visits, patients cannot have anyone with them due to current Covid guidelines and our immunocompromised population.  ? ?Iron Sucrose Injection ?What is this medication? ?IRON SUCROSE (EYE ern SOO krose) treats low levels of iron (iron deficiency anemia) in people with kidney disease. Iron is a mineral that plays an important role in making red blood cells, which carry oxygen from your lungs to the rest of your body. ?This medicine may   be used for other purposes; ask your health care provider or pharmacist if you have questions. ?COMMON BRAND NAME(S): Venofer ?What should I tell my care team before I take this medication? ?They need to know if you have any of these conditions: ?Anemia not caused by low iron levels ?Heart disease ?High levels of  iron in the blood ?Kidney disease ?Liver disease ?An unusual or allergic reaction to iron, other medications, foods, dyes, or preservatives ?Pregnant or trying to get pregnant ?Breast-feeding ?How should I use this medication? ?This medication is for infusion into a vein. It is given in a hospital or clinic setting. ?Talk to your care team about the use of this medication in children. While this medication may be prescribed for children as young as 2 years for selected conditions, precautions do apply. ?Overdosage: If you think you have taken too much of this medicine contact a poison control center or emergency room at once. ?NOTE: This medicine is only for you. Do not share this medicine with others. ?What if I miss a dose? ?It is important not to miss your dose. Call your care team if you are unable to keep an appointment. ?What may interact with this medication? ?Do not take this medication with any of the following: ?Deferoxamine ?Dimercaprol ?Other iron products ?This medication may also interact with the following: ?Chloramphenicol ?Deferasirox ?This list may not describe all possible interactions. Give your health care provider a list of all the medicines, herbs, non-prescription drugs, or dietary supplements you use. Also tell them if you smoke, drink alcohol, or use illegal drugs. Some items may interact with your medicine. ?What should I watch for while using this medication? ?Visit your care team regularly. Tell your care team if your symptoms do not start to get better or if they get worse. You may need blood work done while you are taking this medication. ?You may need to follow a special diet. Talk to your care team. Foods that contain iron include: whole grains/cereals, dried fruits, beans, or peas, leafy green vegetables, and organ meats (liver, kidney). ?What side effects may I notice from receiving this medication? ?Side effects that you should report to your care team as soon as  possible: ?Allergic reactions--skin rash, itching, hives, swelling of the face, lips, tongue, or throat ?Low blood pressure--dizziness, feeling faint or lightheaded, blurry vision ?Shortness of breath ?Side effects that usually do not require medical attention (report to your care team if they continue or are bothersome): ?Flushing ?Headache ?Joint pain ?Muscle pain ?Nausea ?Pain, redness, or irritation at injection site ?This list may not describe all possible side effects. Call your doctor for medical advice about side effects. You may report side effects to FDA at 1-800-FDA-1088. ?Where should I keep my medication? ?This medication is given in a hospital or clinic and will not be stored at home. ?NOTE: This sheet is a summary. It may not cover all possible information. If you have questions about this medicine, talk to your doctor, pharmacist, or health care provider. ?? 2022 Elsevier/Gold Standard (2020-05-19 00:00:00) ? ?

## 2021-07-02 ENCOUNTER — Inpatient Hospital Stay (HOSPITAL_BASED_OUTPATIENT_CLINIC_OR_DEPARTMENT_OTHER): Payer: Medicare Other | Admitting: Oncology

## 2021-07-02 ENCOUNTER — Inpatient Hospital Stay: Payer: Medicare Other | Attending: Oncology

## 2021-07-02 ENCOUNTER — Encounter: Payer: Self-pay | Admitting: Oncology

## 2021-07-02 ENCOUNTER — Inpatient Hospital Stay: Payer: Medicare Other

## 2021-07-02 VITALS — BP 135/62 | HR 63 | Temp 97.3°F | Resp 18 | Wt 186.6 lb

## 2021-07-02 DIAGNOSIS — N183 Chronic kidney disease, stage 3 unspecified: Secondary | ICD-10-CM | POA: Diagnosis not present

## 2021-07-02 DIAGNOSIS — D509 Iron deficiency anemia, unspecified: Secondary | ICD-10-CM | POA: Diagnosis present

## 2021-07-02 DIAGNOSIS — D631 Anemia in chronic kidney disease: Secondary | ICD-10-CM | POA: Diagnosis not present

## 2021-07-02 LAB — CBC WITH DIFFERENTIAL/PLATELET
Abs Immature Granulocytes: 0.02 10*3/uL (ref 0.00–0.07)
Basophils Absolute: 0.1 10*3/uL (ref 0.0–0.1)
Basophils Relative: 1 %
Eosinophils Absolute: 0.1 10*3/uL (ref 0.0–0.5)
Eosinophils Relative: 2 %
HCT: 34.3 % — ABNORMAL LOW (ref 39.0–52.0)
Hemoglobin: 10.9 g/dL — ABNORMAL LOW (ref 13.0–17.0)
Immature Granulocytes: 0 %
Lymphocytes Relative: 20 %
Lymphs Abs: 1.1 10*3/uL (ref 0.7–4.0)
MCH: 28.2 pg (ref 26.0–34.0)
MCHC: 31.8 g/dL (ref 30.0–36.0)
MCV: 88.6 fL (ref 80.0–100.0)
Monocytes Absolute: 0.5 10*3/uL (ref 0.1–1.0)
Monocytes Relative: 10 %
Neutro Abs: 3.7 10*3/uL (ref 1.7–7.7)
Neutrophils Relative %: 67 %
Platelets: 217 10*3/uL (ref 150–400)
RBC: 3.87 MIL/uL — ABNORMAL LOW (ref 4.22–5.81)
RDW: 15.5 % (ref 11.5–15.5)
WBC: 5.5 10*3/uL (ref 4.0–10.5)
nRBC: 0 % (ref 0.0–0.2)

## 2021-07-02 LAB — IRON AND TIBC
Iron: 63 ug/dL (ref 45–182)
Saturation Ratios: 20 % (ref 17.9–39.5)
TIBC: 322 ug/dL (ref 250–450)
UIBC: 259 ug/dL

## 2021-07-02 LAB — FERRITIN: Ferritin: 46 ng/mL (ref 24–336)

## 2021-11-01 ENCOUNTER — Inpatient Hospital Stay: Payer: Medicare Other | Attending: Oncology

## 2021-11-01 DIAGNOSIS — D509 Iron deficiency anemia, unspecified: Secondary | ICD-10-CM | POA: Insufficient documentation

## 2021-11-01 LAB — CBC WITH DIFFERENTIAL/PLATELET
Abs Immature Granulocytes: 0.03 10*3/uL (ref 0.00–0.07)
Basophils Absolute: 0.1 10*3/uL (ref 0.0–0.1)
Basophils Relative: 1 %
Eosinophils Absolute: 0.1 10*3/uL (ref 0.0–0.5)
Eosinophils Relative: 2 %
HCT: 31 % — ABNORMAL LOW (ref 39.0–52.0)
Hemoglobin: 9.9 g/dL — ABNORMAL LOW (ref 13.0–17.0)
Immature Granulocytes: 1 %
Lymphocytes Relative: 18 %
Lymphs Abs: 1 10*3/uL (ref 0.7–4.0)
MCH: 28.6 pg (ref 26.0–34.0)
MCHC: 31.9 g/dL (ref 30.0–36.0)
MCV: 89.6 fL (ref 80.0–100.0)
Monocytes Absolute: 0.6 10*3/uL (ref 0.1–1.0)
Monocytes Relative: 10 %
Neutro Abs: 3.9 10*3/uL (ref 1.7–7.7)
Neutrophils Relative %: 68 %
Platelets: 246 10*3/uL (ref 150–400)
RBC: 3.46 MIL/uL — ABNORMAL LOW (ref 4.22–5.81)
RDW: 15.2 % (ref 11.5–15.5)
WBC: 5.7 10*3/uL (ref 4.0–10.5)
nRBC: 0 % (ref 0.0–0.2)

## 2021-11-01 LAB — FERRITIN: Ferritin: 30 ng/mL (ref 24–336)

## 2021-11-01 LAB — IRON AND TIBC
Iron: 58 ug/dL (ref 45–182)
Saturation Ratios: 17 % — ABNORMAL LOW (ref 17.9–39.5)
TIBC: 333 ug/dL (ref 250–450)
UIBC: 275 ug/dL

## 2021-11-08 ENCOUNTER — Inpatient Hospital Stay: Payer: Medicare Other | Attending: Oncology

## 2021-11-08 VITALS — BP 128/59 | HR 57 | Temp 97.1°F | Resp 16

## 2021-11-08 DIAGNOSIS — D509 Iron deficiency anemia, unspecified: Secondary | ICD-10-CM | POA: Diagnosis present

## 2021-11-08 DIAGNOSIS — D500 Iron deficiency anemia secondary to blood loss (chronic): Secondary | ICD-10-CM

## 2021-11-08 MED ORDER — SODIUM CHLORIDE 0.9 % IV SOLN
200.0000 mg | Freq: Once | INTRAVENOUS | Status: AC
  Administered 2021-11-08: 200 mg via INTRAVENOUS
  Filled 2021-11-08: qty 200

## 2021-11-08 MED ORDER — SODIUM CHLORIDE 0.9 % IV SOLN
Freq: Once | INTRAVENOUS | Status: AC
  Filled 2021-11-08: qty 250

## 2021-11-15 ENCOUNTER — Inpatient Hospital Stay: Payer: Medicare Other

## 2021-11-15 VITALS — BP 139/65 | HR 65 | Temp 97.1°F | Resp 16

## 2021-11-15 DIAGNOSIS — D500 Iron deficiency anemia secondary to blood loss (chronic): Secondary | ICD-10-CM

## 2021-11-15 DIAGNOSIS — D509 Iron deficiency anemia, unspecified: Secondary | ICD-10-CM | POA: Diagnosis not present

## 2021-11-15 MED ORDER — SODIUM CHLORIDE 0.9 % IV SOLN
200.0000 mg | Freq: Once | INTRAVENOUS | Status: AC
  Administered 2021-11-15: 200 mg via INTRAVENOUS
  Filled 2021-11-15: qty 290.91

## 2021-11-15 MED ORDER — SODIUM CHLORIDE 0.9 % IV SOLN
Freq: Once | INTRAVENOUS | Status: AC
  Filled 2021-11-15: qty 250

## 2021-11-22 ENCOUNTER — Inpatient Hospital Stay: Payer: Medicare Other

## 2021-11-22 VITALS — BP 145/63 | HR 54 | Temp 97.8°F | Resp 16

## 2021-11-22 DIAGNOSIS — D500 Iron deficiency anemia secondary to blood loss (chronic): Secondary | ICD-10-CM

## 2021-11-22 DIAGNOSIS — D509 Iron deficiency anemia, unspecified: Secondary | ICD-10-CM | POA: Diagnosis not present

## 2021-11-22 MED ORDER — SODIUM CHLORIDE 0.9 % IV SOLN
Freq: Once | INTRAVENOUS | Status: AC
  Filled 2021-11-22: qty 250

## 2021-11-22 MED ORDER — SODIUM CHLORIDE 0.9 % IV SOLN
200.0000 mg | Freq: Once | INTRAVENOUS | Status: AC
  Administered 2021-11-22: 200 mg via INTRAVENOUS
  Filled 2021-11-22: qty 200

## 2021-12-06 ENCOUNTER — Inpatient Hospital Stay: Payer: Medicare Other

## 2021-12-06 VITALS — BP 138/61 | HR 57 | Temp 97.8°F | Resp 16

## 2021-12-06 DIAGNOSIS — D500 Iron deficiency anemia secondary to blood loss (chronic): Secondary | ICD-10-CM

## 2021-12-06 DIAGNOSIS — D509 Iron deficiency anemia, unspecified: Secondary | ICD-10-CM | POA: Diagnosis not present

## 2021-12-06 MED ORDER — SODIUM CHLORIDE 0.9 % IV SOLN
Freq: Once | INTRAVENOUS | Status: AC
  Filled 2021-12-06: qty 250

## 2021-12-06 MED ORDER — SODIUM CHLORIDE 0.9 % IV SOLN
200.0000 mg | Freq: Once | INTRAVENOUS | Status: AC
  Administered 2021-12-06: 200 mg via INTRAVENOUS
  Filled 2021-12-06: qty 200

## 2021-12-06 NOTE — Patient Instructions (Signed)

## 2021-12-13 ENCOUNTER — Inpatient Hospital Stay: Payer: Medicare Other | Attending: Oncology

## 2021-12-13 VITALS — BP 155/74 | HR 68 | Temp 97.0°F | Resp 17

## 2021-12-13 DIAGNOSIS — D509 Iron deficiency anemia, unspecified: Secondary | ICD-10-CM | POA: Diagnosis present

## 2021-12-13 DIAGNOSIS — D500 Iron deficiency anemia secondary to blood loss (chronic): Secondary | ICD-10-CM

## 2021-12-13 MED ORDER — SODIUM CHLORIDE 0.9 % IV SOLN
200.0000 mg | Freq: Once | INTRAVENOUS | Status: AC
  Administered 2021-12-13: 200 mg via INTRAVENOUS
  Filled 2021-12-13: qty 290.91

## 2021-12-13 MED ORDER — SODIUM CHLORIDE 0.9 % IV SOLN
Freq: Once | INTRAVENOUS | Status: AC
  Filled 2021-12-13: qty 250

## 2021-12-13 NOTE — Patient Instructions (Signed)
[facility] AT [place]  Discharge Instructions: Thank you for choosing [facility] to provide your oncology and hematology care.  If you have a lab appointment with the [facility], please go directly to the [facility] and check in at the registration area.  Wear comfortable clothing and clothing appropriate for easy access to any Portacath or PICC line.   We strive to give you quality time with your provider. You may need to reschedule your appointment if you arrive late (15 or more minutes).  Arriving late affects you and other patients whose appointments are after yours.  Also, if you miss three or more appointments without notifying the office, you may be dismissed from the clinic at the provider's discretion.      For prescription refill requests, have your pharmacy contact our office and allow 72 hours for refills to be completed.    Today you received the following chemotherapy and/or immunotherapy agents Feraheme.      To help prevent nausea and vomiting after your treatment, we encourage you to take your nausea medication as directed.  BELOW ARE SYMPTOMS THAT SHOULD BE REPORTED IMMEDIATELY: *FEVER GREATER THAN 100.4 F (38 C) OR HIGHER *CHILLS OR SWEATING *NAUSEA AND VOMITING THAT IS NOT CONTROLLED WITH YOUR NAUSEA MEDICATION *UNUSUAL SHORTNESS OF BREATH *UNUSUAL BRUISING OR BLEEDING *URINARY PROBLEMS (pain or burning when urinating, or frequent urination) *BOWEL PROBLEMS (unusual diarrhea, constipation, pain near the anus) TENDERNESS IN MOUTH AND THROAT WITH OR WITHOUT PRESENCE OF ULCERS (sore throat, sores in mouth, or a toothache) UNUSUAL RASH, SWELLING OR PAIN  UNUSUAL VAGINAL DISCHARGE OR ITCHING   Items with * indicate a potential emergency and should be followed up as soon as possible or go to the Emergency Department if any problems should occur.  Please show the CHEMOTHERAPY ALERT CARD or IMMUNOTHERAPY ALERT CARD at check-in to  the Emergency Department and triage nurse.  Should you have questions after your visit or need to cancel or reschedule your appointment, please contact [facility] [place] AT [place]  [phone] and follow the prompts.  Office hours are 8:00 a.m. to 4:30 p.m. Monday - Friday. Please note that voicemails left after 4:00 p.m. may not be returned until the following business day.  We are closed weekends and major holidays. You have access to a nurse at all times for urgent questions. Please call the main number to the clinic [phone] and follow the prompts.  For any non-urgent questions, you may also contact your provider using MyChart. We now offer e-Visits for anyone 56 and older to request care online for non-urgent symptoms. For details visit [email or web address].   Also download the MyChart app! Go to the app store, search "MyChart", open the app, select [place], and log in with your MyChart username and password.  Masks are optional in the cancer centers. If you would like for your care team to wear a mask while they are taking care of you, please let them know. For doctor visits, patients may have with them one support person who is at least 86 years old. At this time, visitors are not allowed in the infusion area.

## 2022-03-04 ENCOUNTER — Inpatient Hospital Stay (HOSPITAL_BASED_OUTPATIENT_CLINIC_OR_DEPARTMENT_OTHER): Payer: Medicare Other | Admitting: Oncology

## 2022-03-04 ENCOUNTER — Encounter: Payer: Self-pay | Admitting: Oncology

## 2022-03-04 ENCOUNTER — Inpatient Hospital Stay: Payer: Medicare Other | Attending: Oncology

## 2022-03-04 VITALS — BP 164/63 | HR 53 | Temp 96.3°F | Resp 18 | Ht 66.0 in | Wt 189.0 lb

## 2022-03-04 DIAGNOSIS — D649 Anemia, unspecified: Secondary | ICD-10-CM | POA: Diagnosis not present

## 2022-03-04 DIAGNOSIS — N184 Chronic kidney disease, stage 4 (severe): Secondary | ICD-10-CM | POA: Insufficient documentation

## 2022-03-04 DIAGNOSIS — D509 Iron deficiency anemia, unspecified: Secondary | ICD-10-CM | POA: Insufficient documentation

## 2022-03-04 LAB — CBC WITH DIFFERENTIAL/PLATELET
Abs Immature Granulocytes: 0.04 10*3/uL (ref 0.00–0.07)
Basophils Absolute: 0.1 10*3/uL (ref 0.0–0.1)
Basophils Relative: 1 %
Eosinophils Absolute: 0.1 10*3/uL (ref 0.0–0.5)
Eosinophils Relative: 2 %
HCT: 33.7 % — ABNORMAL LOW (ref 39.0–52.0)
Hemoglobin: 10.5 g/dL — ABNORMAL LOW (ref 13.0–17.0)
Immature Granulocytes: 1 %
Lymphocytes Relative: 18 %
Lymphs Abs: 1.1 10*3/uL (ref 0.7–4.0)
MCH: 28.2 pg (ref 26.0–34.0)
MCHC: 31.2 g/dL (ref 30.0–36.0)
MCV: 90.6 fL (ref 80.0–100.0)
Monocytes Absolute: 0.6 10*3/uL (ref 0.1–1.0)
Monocytes Relative: 9 %
Neutro Abs: 4.2 10*3/uL (ref 1.7–7.7)
Neutrophils Relative %: 69 %
Platelets: 234 10*3/uL (ref 150–400)
RBC: 3.72 MIL/uL — ABNORMAL LOW (ref 4.22–5.81)
RDW: 14.9 % (ref 11.5–15.5)
WBC: 6 10*3/uL (ref 4.0–10.5)
nRBC: 0 % (ref 0.0–0.2)

## 2022-03-04 LAB — IRON AND TIBC
Iron: 65 ug/dL (ref 45–182)
Saturation Ratios: 21 % (ref 17.9–39.5)
TIBC: 318 ug/dL (ref 250–450)
UIBC: 253 ug/dL

## 2022-03-04 LAB — FERRITIN: Ferritin: 121 ng/mL (ref 24–336)

## 2022-03-04 NOTE — Progress Notes (Signed)
Hematology/Oncology Consult note [facility]  [phone] [phone]  Patient Care Team: Sindy Guadeloupe, MD as PCP - General (Oncology) Marval Regal, NP as Nurse Practitioner (Nurse Practitioner) Efrain Sella, MD as Consulting Physician (Gastroenterology)   Name of the patient: Tommy Heath  [id number]  01-31-1933   Date of visit: 03/04/22  Diagnosis-  anemia likely multifactorial secondary to iron deficiency and anemia of chronic kidney disease    Chief complaint/ Reason for visit-routine follow-up of anemia  Heme/Onc history: Patient is a 89 year old male who is transferring his care from Dr. Mike Gip to me.  He has a history of iron deficiency anemia secondary to gastric ulcer and duodenal AVM in 2007 2012.  Also noted to have nonbleeding colonic angiectasia in 2017 treated with APC.  He has received IV iron in the past.  He also has stage III/stage IV chronic kidney disease.  He has not required any close so far    Interval history-patient is doing well overall for his age.  He follows up at [facility] for his regular healthcare needs.  No recent hospitalizations.  ECOG PS- 1 Pain scale- 0   Review of systems- Review of Systems  Constitutional:  Negative for chills, fever, malaise/fatigue and weight loss.  HENT:  Negative for congestion, ear discharge and nosebleeds.   Eyes:  Negative for blurred vision.  Respiratory:  Negative for cough, hemoptysis, sputum production, shortness of breath and wheezing.   Cardiovascular:  Negative for chest pain, palpitations, orthopnea and claudication.  Gastrointestinal:  Negative for abdominal pain, blood in stool, constipation, diarrhea, heartburn, melena, nausea and vomiting.  Genitourinary:  Negative for dysuria, flank pain, frequency, hematuria and urgency.  Musculoskeletal:  Negative for back pain, joint pain and myalgias.  Skin:  Negative for rash.  Neurological:  Negative for  dizziness, tingling, focal weakness, seizures, weakness and headaches.  Endo/Heme/Allergies:  Does not bruise/bleed easily.  Psychiatric/Behavioral:  Negative for depression and suicidal ideas. The patient does not have insomnia.       No Known Allergies   Past Medical History:  Diagnosis Date   Allergic rhinitis    Anemia    AVM (arteriovenous malformation) of colon    BPH (benign prostatic hyperplasia)    Cataract cortical, senile    Colon polyps 02/2005, 2012   TUBULAR ADENOMA   Diabetes mellitus type 2, uncomplicated (HCC)    Duodenal ulcer    w/ gastric ulcer   Glaucoma    H. pylori infection    H. pylori infection    History of blood product transfusion    History of blood transfusion 02/2005   hgb was 6   History of duodenal ulcer 02/2005   The lesion was 5 mm in largest dimension.   Hyperlipidemia    Hypertension    Sleep apnea    BiPap     Past Surgical History:  Procedure Laterality Date   COLONOSCOPY     07/2010, 09/09/2002, 10/15/2002, 02/16/2005, Adenomatous polyps, repeat 5 years, Dr Vira Agar   COLONOSCOPY WITH PROPOFOL N/A 03/01/2015   Procedure: COLONOSCOPY WITH PROPOFOL;  Surgeon: Manya Silvas, MD;  Location: [place];  Service: Endoscopy;  Laterality: N/A;   ESOPHAGOGASTRODUODENOSCOPY     08/17/2010, 02/16/2005, no repeat per RTE   ESOPHAGOGASTRODUODENOSCOPY (EGD) WITH PROPOFOL N/A 03/01/2015   Procedure: ESOPHAGOGASTRODUODENOSCOPY (EGD) WITH PROPOFOL;  Surgeon: Manya Silvas, MD;  Location: [facility] ENDOSCOPY;  Service: Endoscopy;  Laterality: N/A;   ESOPHAGOGASTRODUODENOSCOPY (EGD) WITH  PROPOFOL N/A 05/15/2016   Procedure: ESOPHAGOGASTRODUODENOSCOPY (EGD) WITH PROPOFOL;  Surgeon: Manya Silvas, MD;  Location: [facility] ENDOSCOPY;  Service: Endoscopy;  Laterality: N/A;    Social History   Socioeconomic History   Marital status: Single    Spouse name: Not on file   Number of children: Not on file   Years of education: Not on file   Highest education  level: Not on file  Occupational History   Not on file  Tobacco Use   Smoking status: Former    Packs/day: 1.00    Years: 40.00    Total pack years: 40.00    Types: Cigarettes    Quit date: 01/07/1993    Years since quitting: 29.1   Smokeless tobacco: Never  Vaping Use   Vaping Use: Never used  Substance and Sexual Activity   Alcohol use: No    Alcohol/week: 0.0 standard drinks of alcohol   Drug use: No   Sexual activity: Never  Other Topics Concern   Not on file  Social History Narrative   Not on file   Social Determinants of Health   Financial Resource Strain: Not on file  Food Insecurity: Not on file  Transportation Needs: Not on file  Physical Activity: Not on file  Stress: Not on file  Social Connections: Not on file  Intimate Partner Violence: Not on file    Family History  Problem Relation Age of Onset   Diabetes Mother    Breast cancer Sister    Prostate cancer Neg Hx    Bladder Cancer Neg Hx    Kidney cancer Neg Hx      Current Outpatient Medications:    amLODipine (NORVASC) 10 MG tablet, Take 10 mg by mouth daily. , Disp: , Rfl:    atenolol (TENORMIN) 50 MG tablet, TAKE ONE TABLET BY MOUTH EVERY MORNING FOR BLOOD PRESSURE, Disp: , Rfl:    cyanocobalamin 1000 MCG tablet, Take 1 tablet by mouth every morning., Disp: , Rfl:    ferrous sulfate 325 (65 FE) MG tablet, Take 1 tablet by mouth 2 (two) times daily with a meal., Disp: , Rfl:    finasteride (PROSCAR) 5 MG tablet, Take 1 tablet (5 mg total) by mouth daily., Disp: 90 tablet, Rfl: 3   glimepiride (AMARYL) 2 MG tablet, Take 1 tablet by mouth every morning., Disp: , Rfl:    glipiZIDE (GLUCOTROL) 10 MG tablet, Take 10 mg by mouth daily before breakfast., Disp: , Rfl:    latanoprost (XALATAN) 0.005 % ophthalmic solution, Place 1 drop into both eyes at bedtime., Disp: , Rfl:    metFORMIN (GLUCOPHAGE) 1000 MG tablet, Take 500 mg by mouth 2 (two) times daily with a meal. , Disp: , Rfl:    metFORMIN  (GLUCOPHAGE) 500 MG tablet, , Disp: , Rfl:    pioglitazone (ACTOS) 30 MG tablet, Take 45 mg by mouth daily. , Disp: , Rfl:    ramipril (ALTACE) 10 MG capsule, Take 10 mg by mouth daily., Disp: , Rfl:    rosuvastatin (CRESTOR) 20 MG tablet, TAKE ONE-HALF TABLET BY MOUTH AT BEDTIME FOR HIGH BLOOD CHOLESTEROL, Disp: , Rfl:    sitaGLIPtin (JANUVIA) 100 MG tablet, Take 100 mg by mouth daily. , Disp: , Rfl:    tamsulosin (FLOMAX) 0.4 MG CAPS capsule, Take 0.4 mg by mouth daily., Disp: , Rfl:  No current facility-administered medications for this visit.  Facility-Administered Medications Ordered in Other Visits:    iron sucrose (VENOFER) injection 200 mg, 200 mg,  Intravenous, Once, Cammie Sickle, MD  Physical exam:  Vitals:   03/04/22 1119  BP: (!) 164/63  Pulse: (!) 53  Resp: 18  Temp: (!) 96.3 F (35.7 C)  TempSrc: Tympanic  SpO2: 100%  Weight: 189 lb (85.7 kg)  Height: '5\' 6"'$  (1.676 m)   Physical Exam Cardiovascular:     Rate and Rhythm: Normal rate and regular rhythm.     Heart sounds: Normal heart sounds.  Pulmonary:     Effort: Pulmonary effort is normal.     Breath sounds: Normal breath sounds.  Abdominal:     General: Bowel sounds are normal.     Palpations: Abdomen is soft.  Skin:    General: Skin is warm and dry.  Neurological:     Mental Status: He is alert and oriented to person, place, and time.         Latest Ref Rng & Units 06/21/2020    2:09 PM  CMP  Glucose 70 - 99 mg/dL 115   BUN 8 - 23 mg/dL 25   Creatinine 0.61 - 1.24 mg/dL 1.77   Sodium 135 - 145 mmol/L 138   Potassium 3.5 - 5.1 mmol/L 3.3   Chloride 98 - 111 mmol/L 106   CO2 22 - 32 mmol/L 25   Calcium 8.9 - 10.3 mg/dL 8.8       Latest Ref Rng & Units 03/04/2022   11:03 AM  CBC  WBC 4.0 - 10.5 K/uL 6.0   Hemoglobin 13.0 - 17.0 g/dL 10.5   Hematocrit 39.0 - 52.0 % 33.7   Platelets 150 - 400 K/uL 234     No images are attached to the encounter.  No results found.   Assessment and  plan- Patient is a 89 y.o. male here for routine follow-up of normocytic anemia  Patient's hemoglobin has remained stable between 10-11 for the last 7 years.  Ferritin levels have remained between 30-50 but despite giving him IV iron in the past he has not had any significant improvement in his hemoglobin.  Today labs are not overtly suggestive of iron deficiency and given the stability in his hemoglobin and lack of symptoms I am holding off on any IV iron at this time.  I will repeat CBC ferritin and iron studies in 6 months in 1 year and see him back in 1 year   Visit Diagnosis 1. Iron deficiency anemia, unspecified iron deficiency anemia type   2. Normocytic anemia      Dr. Randa Evens, MD, MPH [facility] [id number] 03/04/2022 12:53 PM

## 2022-07-17 ENCOUNTER — Other Ambulatory Visit: Payer: Self-pay | Admitting: *Deleted

## 2022-07-17 ENCOUNTER — Telehealth: Payer: Self-pay | Admitting: *Deleted

## 2022-07-17 DIAGNOSIS — D509 Iron deficiency anemia, unspecified: Secondary | ICD-10-CM

## 2022-07-17 NOTE — Telephone Encounter (Signed)
Pt wants to come and get hgb to see if the hgb. Is dropping or not. Dr. Smith Robert is fine to get labs  cbc, ferritin, IIBC labs tom at 2:45 and pt is good with this

## 2022-07-18 ENCOUNTER — Inpatient Hospital Stay: Payer: Medicare Other | Attending: Oncology

## 2022-07-18 DIAGNOSIS — D509 Iron deficiency anemia, unspecified: Secondary | ICD-10-CM | POA: Diagnosis present

## 2022-07-18 LAB — FERRITIN: Ferritin: 61 ng/mL (ref 24–336)

## 2022-07-18 LAB — CBC
HCT: 30.9 % — ABNORMAL LOW (ref 39.0–52.0)
Hemoglobin: 9.7 g/dL — ABNORMAL LOW (ref 13.0–17.0)
MCH: 27.7 pg (ref 26.0–34.0)
MCHC: 31.4 g/dL (ref 30.0–36.0)
MCV: 88.3 fL (ref 80.0–100.0)
Platelets: 237 10*3/uL (ref 150–400)
RBC: 3.5 MIL/uL — ABNORMAL LOW (ref 4.22–5.81)
RDW: 15.3 % (ref 11.5–15.5)
WBC: 5.6 10*3/uL (ref 4.0–10.5)
nRBC: 0 % (ref 0.0–0.2)

## 2022-07-18 LAB — IRON AND TIBC
Iron: 55 ug/dL (ref 45–182)
Saturation Ratios: 16 % — ABNORMAL LOW (ref 17.9–39.5)
TIBC: 349 ug/dL (ref 250–450)
UIBC: 294 ug/dL

## 2022-07-23 ENCOUNTER — Telehealth: Payer: Self-pay | Admitting: *Deleted

## 2022-07-23 NOTE — Telephone Encounter (Signed)
Dr. Smith Robert wanted to check with patient to see if he wanted to have some IV iron but first we needed to check and see about his creatinine and the last time it had been checked which was in April 2024 at the [facility] and it was a 2.0 and also the [place] notes say that he does have CKD and he does have that.  Dr. Smith Robert says it is fine for him to get IV iron, patient is agreeable with this but he needs to get his appointment set up and Magin will check and see what kind of iron his insurance will approve and then make appointments and will get back to him.

## 2022-07-25 ENCOUNTER — Telehealth: Payer: Self-pay | Admitting: *Deleted

## 2022-07-25 NOTE — Telephone Encounter (Signed)
She reached out with pt. And got the schedule appts for him . Pt knows the dates

## 2022-08-01 ENCOUNTER — Inpatient Hospital Stay: Payer: Medicare Other

## 2022-08-01 VITALS — BP 138/59 | HR 51 | Temp 96.3°F | Resp 18

## 2022-08-01 DIAGNOSIS — D500 Iron deficiency anemia secondary to blood loss (chronic): Secondary | ICD-10-CM

## 2022-08-01 DIAGNOSIS — D509 Iron deficiency anemia, unspecified: Secondary | ICD-10-CM | POA: Diagnosis not present

## 2022-08-01 MED ORDER — SODIUM CHLORIDE 0.9 % IV SOLN
200.0000 mg | Freq: Once | INTRAVENOUS | Status: AC
  Administered 2022-08-01: 200 mg via INTRAVENOUS
  Filled 2022-07-31: qty 10

## 2022-08-01 MED ORDER — SODIUM CHLORIDE 0.9 % IV SOLN
Freq: Once | INTRAVENOUS | Status: AC
  Filled 2022-08-01: qty 250

## 2022-08-01 NOTE — Addendum Note (Signed)
Addended by: Ihor Austin D on: 08/01/2022 03:41 PM   Modules accepted: Orders

## 2022-08-01 NOTE — Progress Notes (Signed)
1420: Pt has been educated and understands. Pt declined to stay 30 mins after iron infusion. VSS.

## 2022-08-07 MED FILL — Iron Sucrose Inj 20 MG/ML (Fe Equiv): INTRAVENOUS | Qty: 10 | Status: AC

## 2022-08-08 ENCOUNTER — Inpatient Hospital Stay: Payer: Medicare Other | Attending: Oncology

## 2022-08-08 VITALS — BP 128/54 | HR 53 | Resp 16

## 2022-08-08 DIAGNOSIS — E611 Iron deficiency: Secondary | ICD-10-CM | POA: Insufficient documentation

## 2022-08-08 DIAGNOSIS — D500 Iron deficiency anemia secondary to blood loss (chronic): Secondary | ICD-10-CM

## 2022-08-08 DIAGNOSIS — D631 Anemia in chronic kidney disease: Secondary | ICD-10-CM | POA: Diagnosis not present

## 2022-08-08 DIAGNOSIS — Z87891 Personal history of nicotine dependence: Secondary | ICD-10-CM | POA: Insufficient documentation

## 2022-08-08 DIAGNOSIS — N184 Chronic kidney disease, stage 4 (severe): Secondary | ICD-10-CM | POA: Insufficient documentation

## 2022-08-08 MED ORDER — SODIUM CHLORIDE 0.9 % IV SOLN
Freq: Once | INTRAVENOUS | Status: AC
  Filled 2022-08-08: qty 250

## 2022-08-08 MED ORDER — SODIUM CHLORIDE 0.9 % IV SOLN
200.0000 mg | Freq: Once | INTRAVENOUS | Status: AC
  Administered 2022-08-08: 200 mg via INTRAVENOUS
  Filled 2022-08-08: qty 200

## 2022-08-08 NOTE — Patient Instructions (Signed)
[place] [facility]  Discharge Instructions: Thank you for choosing [facility] to provide your oncology and hematology care.  If you have a lab appointment with the Cancer Center, please go directly to the Cancer Center and check in at the registration area.  Wear comfortable clothing and clothing appropriate for easy access to any Portacath or PICC line.   We strive to give you quality time with your provider. You may need to reschedule your appointment if you arrive late (15 or more minutes).  Arriving late affects you and other patients whose appointments are after yours.  Also, if you miss three or more appointments without notifying the office, you may be dismissed from the clinic at the provider's discretion.      For prescription refill requests, have your pharmacy contact our office and allow 72 hours for refills to be completed.    Today you received the following chemotherapy and/or immunotherapy agents Feraheme.      To help prevent nausea and vomiting after your treatment, we encourage you to take your nausea medication as directed.  BELOW ARE SYMPTOMS THAT SHOULD BE REPORTED IMMEDIATELY: *FEVER GREATER THAN 100.4 F (38 C) OR HIGHER *CHILLS OR SWEATING *NAUSEA AND VOMITING THAT IS NOT CONTROLLED WITH YOUR NAUSEA MEDICATION *UNUSUAL SHORTNESS OF BREATH *UNUSUAL BRUISING OR BLEEDING *URINARY PROBLEMS (pain or burning when urinating, or frequent urination) *BOWEL PROBLEMS (unusual diarrhea, constipation, pain near the anus) TENDERNESS IN MOUTH AND THROAT WITH OR WITHOUT PRESENCE OF ULCERS (sore throat, sores in mouth, or a toothache) UNUSUAL RASH, SWELLING OR PAIN  UNUSUAL VAGINAL DISCHARGE OR ITCHING   Items with * indicate a potential emergency and should be followed up as soon as possible or go to the Emergency Department if any problems should occur.  Please show the CHEMOTHERAPY ALERT CARD or IMMUNOTHERAPY ALERT CARD at check-in to  the Emergency Department and triage nurse.  Should you have questions after your visit or need to cancel or reschedule your appointment, please contact [place] [facility]  [phone] and follow the prompts.  Office hours are 8:00 a.m. to 4:30 p.m. Monday - Friday. Please note that voicemails left after 4:00 p.m. may not be returned until the following business day.  We are closed weekends and major holidays. You have access to a nurse at all times for urgent questions. Please call the main number to the clinic [phone] and follow the prompts.  For any non-urgent questions, you may also contact your provider using MyChart. We now offer e-Visits for anyone 77 and older to request care online for non-urgent symptoms. For details visit [email or web address].   Also download the MyChart app! Go to the app store, search "MyChart", open the app, select [place], and log in with your MyChart username and password.  Masks are optional in the cancer centers. If you would like for your care team to wear a mask while they are taking care of you, please let them know. For doctor visits, patients may have with them one support person who is at least 87 years old. At this time, visitors are not allowed in the infusion area.

## 2022-08-08 NOTE — Progress Notes (Signed)
Pt refused 30 minute post observation period.  VSS.  No complaints.  Pt understands risks.

## 2022-08-12 ENCOUNTER — Other Ambulatory Visit: Payer: Self-pay | Admitting: *Deleted

## 2022-08-12 DIAGNOSIS — D509 Iron deficiency anemia, unspecified: Secondary | ICD-10-CM

## 2022-08-13 ENCOUNTER — Encounter: Payer: Self-pay | Admitting: Oncology

## 2022-08-13 ENCOUNTER — Inpatient Hospital Stay: Payer: Medicare Other

## 2022-08-13 ENCOUNTER — Inpatient Hospital Stay: Payer: Medicare Other | Admitting: Oncology

## 2022-08-13 VITALS — BP 165/68 | HR 61 | Temp 96.6°F | Resp 18 | Ht 66.0 in | Wt 182.7 lb

## 2022-08-13 DIAGNOSIS — D509 Iron deficiency anemia, unspecified: Secondary | ICD-10-CM

## 2022-08-13 DIAGNOSIS — E611 Iron deficiency: Secondary | ICD-10-CM | POA: Diagnosis not present

## 2022-08-13 LAB — BASIC METABOLIC PANEL
Anion gap: 9 (ref 5–15)
BUN: 34 mg/dL — ABNORMAL HIGH (ref 8–23)
CO2: 23 mmol/L (ref 22–32)
Calcium: 8.8 mg/dL — ABNORMAL LOW (ref 8.9–10.3)
Chloride: 107 mmol/L (ref 98–111)
Creatinine, Ser: 2.19 mg/dL — ABNORMAL HIGH (ref 0.61–1.24)
GFR, Estimated: 29 mL/min — ABNORMAL LOW (ref >60)
Glucose, Bld: 160 mg/dL — ABNORMAL HIGH (ref 70–99)
Potassium: 3.4 mmol/L — ABNORMAL LOW (ref 3.5–5.1)
Sodium: 139 mmol/L (ref 135–145)

## 2022-08-13 LAB — CBC
HCT: 30.1 % — ABNORMAL LOW (ref 39.0–52.0)
Hemoglobin: 9.4 g/dL — ABNORMAL LOW (ref 13.0–17.0)
MCH: 27.6 pg (ref 26.0–34.0)
MCHC: 31.2 g/dL (ref 30.0–36.0)
MCV: 88.3 fL (ref 80.0–100.0)
Platelets: 222 10*3/uL (ref 150–400)
RBC: 3.41 MIL/uL — ABNORMAL LOW (ref 4.22–5.81)
RDW: 16.6 % — ABNORMAL HIGH (ref 11.5–15.5)
WBC: 5.4 10*3/uL (ref 4.0–10.5)
nRBC: 0 % (ref 0.0–0.2)

## 2022-08-13 LAB — IRON AND TIBC
Iron: 61 ug/dL (ref 45–182)
Saturation Ratios: 20 % (ref 17.9–39.5)
TIBC: 308 ug/dL (ref 250–450)
UIBC: 247 ug/dL

## 2022-08-13 LAB — FERRITIN: Ferritin: 195 ng/mL (ref 24–336)

## 2022-08-13 NOTE — Progress Notes (Signed)
Hematology/Oncology Consult note [facility]  [phone] Fax:([phone]  Patient Care Team: Creig Hines, MD as PCP - General (Oncology) Theadore Nan, NP as Nurse Practitioner (Nurse Practitioner) Stanton Kidney, MD as Consulting Physician (Gastroenterology)   Name of the patient: Tommy Heath  [id number]  [birth year]   Date of visit: 08/13/22  Diagnosis-  anemia likely multifactorial secondary to iron deficiency and anemia of chronic kidney disease   Chief complaint/ Reason for visit-routine follow-up of anemia  Heme/Onc history: Patient is a 89 year old male who is transferring his care from Dr. Merlene Pulling to me.  He has a history of iron deficiency anemia secondary to gastric ulcer and duodenal AVM in 2007 2012.  Also noted to have nonbleeding colonic angiectasia in 2017 treated with APC.  He has received IV iron in the past.  He also has stage III/stage IV chronic kidney disease.  He has not required any EPO so far   Interval history-patient reports feeling better after he has received 2 doses of Venofer.  He would like to come less often if possible.  Denies any blood loss in his stool or urine.  ECOG PS- 1 Pain scale- 0   Review of systems- Review of Systems  Constitutional:  Positive for malaise/fatigue. Negative for chills, fever and weight loss.  HENT:  Negative for congestion, ear discharge and nosebleeds.   Eyes:  Negative for blurred vision.  Respiratory:  Negative for cough, hemoptysis, sputum production, shortness of breath and wheezing.   Cardiovascular:  Negative for chest pain, palpitations, orthopnea and claudication.  Gastrointestinal:  Negative for abdominal pain, blood in stool, constipation, diarrhea, heartburn, melena, nausea and vomiting.  Genitourinary:  Negative for dysuria, flank pain, frequency, hematuria and urgency.  Musculoskeletal:  Negative for back pain, joint pain and myalgias.  Skin:  Negative  for rash.  Neurological:  Negative for dizziness, tingling, focal weakness, seizures, weakness and headaches.  Endo/Heme/Allergies:  Does not bruise/bleed easily.  Psychiatric/Behavioral:  Negative for depression and suicidal ideas. The patient does not have insomnia.       No Known Allergies   Past Medical History:  Diagnosis Date   Allergic rhinitis    Anemia    AVM (arteriovenous malformation) of colon    BPH (benign prostatic hyperplasia)    Cataract cortical, senile    Colon polyps 02/2005, 2012   TUBULAR ADENOMA   Diabetes mellitus type 2, uncomplicated (HCC)    Duodenal ulcer    w/ gastric ulcer   Glaucoma    H. pylori infection    H. pylori infection    History of blood product transfusion    History of blood transfusion 02/2005   hgb was 6   History of duodenal ulcer 02/2005   The lesion was 5 mm in largest dimension.   Hyperlipidemia    Hypertension    Sleep apnea    BiPap     Past Surgical History:  Procedure Laterality Date   COLONOSCOPY     07/2010, 09/09/2002, 10/15/2002, 02/16/2005, Adenomatous polyps, repeat 5 years, Dr Mechele Collin   COLONOSCOPY WITH PROPOFOL N/A 03/01/2015   Procedure: COLONOSCOPY WITH PROPOFOL;  Surgeon: Scot Jun, MD;  Location: [facility] ENDOSCOPY;  Service: Endoscopy;  Laterality: N/A;   ESOPHAGOGASTRODUODENOSCOPY     08/17/2010, 02/16/2005, no repeat per RTE   ESOPHAGOGASTRODUODENOSCOPY (EGD) WITH PROPOFOL N/A 03/01/2015   Procedure: ESOPHAGOGASTRODUODENOSCOPY (EGD) WITH PROPOFOL;  Surgeon: Scot Jun, MD;  Location: [facility] ENDOSCOPY;  Service: Endoscopy;  Laterality: N/A;   ESOPHAGOGASTRODUODENOSCOPY (EGD) WITH PROPOFOL N/A 05/15/2016   Procedure: ESOPHAGOGASTRODUODENOSCOPY (EGD) WITH PROPOFOL;  Surgeon: Scot Jun, MD;  Location: [facility] END[facility]COPY;  Service: Endoscopy;  Laterality: N/A;    Social History   Socioeconomic History   Marital status: Single    Spouse name: Not on file   Number of children: Not on file   Years of  education: Not on file   Highest education level: Not on file  Occupational History   Not on file  Tobacco Use   Smoking status: Former    Current packs/day: 0.00    Average packs/day: 1 pack/day for 40.0 years (40.0 ttl pk-yrs)    Types: Cigarettes    Start date: 01/07/1953    Quit date: 01/07/1993    Years since quitting: 29.6   Smokeless tobacco: Never  Vaping Use   Vaping status: Never Used  Substance and Sexual Activity   Alcohol use: No    Alcohol/week: 0.0 standard drinks of alcohol   Drug use: No   Sexual activity: Never  Other Topics Concern   Not on file  Social History Narrative   Not on file   Social Determinants of Health   Financial Resource Strain: Low Risk  (11/20/2016)   Received from [facility], [facility]   Overall Financial Resource Strain (CARDIA)    Difficulty of Paying Living Expenses: Not very hard  Food Insecurity: No Food Insecurity (11/20/2016)   Received from [facility], [facility]   Hunger Vital Sign    Worried About Running Out of Food in the Last Year: Never true    Ran Out of Food in the Last Year: Never true  Transportation Needs: No Transportation Needs (11/20/2016)   Received from [facility], [facility]   PRAPARE - Transportation    Lack of Transportation (Medical): No    Lack of Transportation (Non-Medical): No  Physical Activity: Insufficiently Active (11/20/2016)   Received from [facility], [facility]   Exercise Vital Sign    Days of Exercise per Week: 1 day    Minutes of Exercise per Session: 30 min  Stress: No Stress Concern Present (11/20/2016)   Received from [facility], [facility]   [employer] of Occupational Health - Occupational Stress Questionnaire    Feeling of Stress : Not at all  Social Connections: Moderately Integrated  (11/20/2016)   Received from [facility], [facility]   Social Connection and Isolation Panel [NHANES]    Frequency of Communication with Friends and Family: More than three times a week    Frequency of Social Gatherings with Friends and Family: More than three times a week    Attends Religious Services: More than 4 times per year    Active Member of [employer] or Organizations: Yes    Attends Engineer, structural: More than 4 times per year    Marital Status: Divorced  Catering manager Violence: Not on file    Family History  Problem Relation Age of Onset   Diabetes Mother    Breast cancer Sister    Prostate cancer Neg Hx    Bladder Cancer Neg Hx    Kidney cancer Neg Hx      Current Outpatient Medications:    amLODipine (NORVASC) 10 MG tablet, Take 10 mg by mouth daily. , Disp: , Rfl:  atenolol (TENORMIN) 50 MG tablet, TAKE ONE TABLET BY MOUTH EVERY MORNING FOR BLOOD PRESSURE, Disp: , Rfl:    cyanocobalamin 1000 MCG tablet, Take 1 tablet by mouth every morning., Disp: , Rfl:    ferrous sulfate 325 (65 FE) MG tablet, Take 1 tablet by mouth 2 (two) times daily with a meal., Disp: , Rfl:    finasteride (PROSCAR) 5 MG tablet, Take 1 tablet (5 mg total) by mouth daily., Disp: 90 tablet, Rfl: 3   glimepiride (AMARYL) 2 MG tablet, Take 1 tablet by mouth every morning., Disp: , Rfl:    glipiZIDE (GLUCOTROL) 10 MG tablet, Take 10 mg by mouth daily before breakfast., Disp: , Rfl:    latanoprost (XALATAN) 0.005 % ophthalmic solution, Place 1 drop into both eyes at bedtime., Disp: , Rfl:    metFORMIN (GLUCOPHAGE) 1000 MG tablet, Take 500 mg by mouth 2 (two) times daily with a meal. , Disp: , Rfl:    metFORMIN (GLUCOPHAGE) 500 MG tablet, , Disp: , Rfl:    pioglitazone (ACTOS) 30 MG tablet, Take 45 mg by mouth daily. , Disp: , Rfl:    ramipril (ALTACE) 10 MG capsule, Take 10 mg by mouth daily., Disp: , Rfl:    rosuvastatin (CRESTOR) 20 MG tablet, TAKE  ONE-HALF TABLET BY MOUTH AT BEDTIME FOR HIGH BLOOD CHOLESTEROL, Disp: , Rfl:    sitaGLIPtin (JANUVIA) 100 MG tablet, Take 100 mg by mouth daily. , Disp: , Rfl:    tamsulosin (FLOMAX) 0.4 MG CAPS capsule, Take 0.4 mg by mouth daily., Disp: , Rfl:  No current facility-administered medications for this visit.  Facility-Administered Medications Ordered in Other Visits:    iron sucrose (VENOFER) injection 200 mg, 200 mg, Intravenous, Once, Earna Coder, MD  Physical exam:  Vitals:   08/13/22 1110 08/13/22 1113  BP: (!) 162/65 (!) 165/68  Pulse: (!) 51 61  Resp: 18   Temp: (!) 96.6 F (35.9 C)   TempSrc: Tympanic   SpO2: 100%   Weight: 182 lb 11.2 oz (82.9 kg)   Height: 5\' 6"  (1.676 m)    Physical Exam Cardiovascular:     Rate and Rhythm: Normal rate and regular rhythm.     Heart sounds: Normal heart sounds.  Pulmonary:     Effort: Pulmonary effort is normal.     Breath sounds: Normal breath sounds.  Abdominal:     General: Bowel sounds are normal.     Palpations: Abdomen is soft.  Skin:    General: Skin is warm and dry.  Neurological:     Mental Status: He is alert and oriented to person, place, and time.         Latest Ref Rng & Units 08/13/2022   10:55 AM  CMP  Glucose 70 - 99 mg/dL 161   BUN 8 - 23 mg/dL 34   Creatinine 0.96 - 1.24 mg/dL 0.45   Sodium 409 - 811 mmol/L 139   Potassium 3.5 - 5.1 mmol/L 3.4   Chloride 98 - 111 mmol/L 107   CO2 22 - 32 mmol/L 23   Calcium 8.9 - 10.3 mg/dL 8.8       Latest Ref Rng & Units 08/13/2022   10:55 AM  CBC  WBC 4.0 - 10.5 K/uL 5.4   Hemoglobin 13.0 - 17.0 g/dL 9.4   Hematocrit 91.4 - 52.0 % 30.1   Platelets 150 - 400 K/uL 222      Assessment and plan- Patient is a 89 y.o. male here for  routine follow-up of anemia of chronic kidney disease  Patient's baseline hemoglobin runs between 10-11 and has drifted down to the nines in the last 1 month.  Iron studies from July were indicative of iron deficiency given that  his ferritin levels were less than 100 with an iron saturation of less than 20%.  He has received 2 doses of Venofer so far and I will plan to give him 1 more dose of Feraheme at this time.  Repeat CBC ferritin and iron studies in 3 in 6 months and I will see him back in 6 months.   Visit Diagnosis 1. Iron deficiency anemia, unspecified iron deficiency anemia type      Dr. Owens Shark, MD, MPH [facility] [phone] 08/13/2022 1:06 PM

## 2022-08-15 ENCOUNTER — Inpatient Hospital Stay: Payer: Medicare Other

## 2022-08-15 VITALS — BP 132/54 | HR 54 | Temp 97.9°F | Resp 18

## 2022-08-15 DIAGNOSIS — D500 Iron deficiency anemia secondary to blood loss (chronic): Secondary | ICD-10-CM

## 2022-08-15 DIAGNOSIS — E611 Iron deficiency: Secondary | ICD-10-CM | POA: Diagnosis not present

## 2022-08-15 MED ORDER — SODIUM CHLORIDE 0.9 % IV SOLN
Freq: Once | INTRAVENOUS | Status: AC
  Filled 2022-08-15: qty 250

## 2022-08-15 MED ORDER — SODIUM CHLORIDE 0.9 % IV SOLN
510.0000 mg | Freq: Once | INTRAVENOUS | Status: AC
  Administered 2022-08-15: 510 mg via INTRAVENOUS
  Filled 2022-08-15: qty 510

## 2022-09-02 ENCOUNTER — Inpatient Hospital Stay: Payer: Medicare Other

## 2022-09-02 DIAGNOSIS — D509 Iron deficiency anemia, unspecified: Secondary | ICD-10-CM

## 2022-09-02 DIAGNOSIS — E611 Iron deficiency: Secondary | ICD-10-CM | POA: Diagnosis not present

## 2022-09-02 LAB — FERRITIN: Ferritin: 359 ng/mL — ABNORMAL HIGH (ref 24–336)

## 2022-09-02 LAB — CBC
HCT: 31.3 % — ABNORMAL LOW (ref 39.0–52.0)
Hemoglobin: 9.7 g/dL — ABNORMAL LOW (ref 13.0–17.0)
MCH: 28.1 pg (ref 26.0–34.0)
MCHC: 31 g/dL (ref 30.0–36.0)
MCV: 90.7 fL (ref 80.0–100.0)
Platelets: 202 10*3/uL (ref 150–400)
RBC: 3.45 MIL/uL — ABNORMAL LOW (ref 4.22–5.81)
RDW: 17.4 % — ABNORMAL HIGH (ref 11.5–15.5)
WBC: 5.9 10*3/uL (ref 4.0–10.5)
nRBC: 0 % (ref 0.0–0.2)

## 2022-09-02 LAB — IRON AND TIBC
Iron: 70 ug/dL (ref 45–182)
Saturation Ratios: 25 % (ref 17.9–39.5)
TIBC: 283 ug/dL (ref 250–450)
UIBC: 213 ug/dL

## 2022-11-13 ENCOUNTER — Inpatient Hospital Stay: Payer: Medicare Other | Attending: Oncology

## 2022-11-13 DIAGNOSIS — D509 Iron deficiency anemia, unspecified: Secondary | ICD-10-CM | POA: Diagnosis present

## 2022-11-13 LAB — FERRITIN: Ferritin: 104 ng/mL (ref 24–336)

## 2022-11-13 LAB — CBC
HCT: 27.3 % — ABNORMAL LOW (ref 39.0–52.0)
Hemoglobin: 8.6 g/dL — ABNORMAL LOW (ref 13.0–17.0)
MCH: 29 pg (ref 26.0–34.0)
MCHC: 31.5 g/dL (ref 30.0–36.0)
MCV: 91.9 fL (ref 80.0–100.0)
Platelets: 261 10*3/uL (ref 150–400)
RBC: 2.97 MIL/uL — ABNORMAL LOW (ref 4.22–5.81)
RDW: 15.6 % — ABNORMAL HIGH (ref 11.5–15.5)
WBC: 6.4 10*3/uL (ref 4.0–10.5)
nRBC: 0 % (ref 0.0–0.2)

## 2022-11-13 LAB — IRON AND TIBC
Iron: 51 ug/dL (ref 45–182)
Saturation Ratios: 16 % — ABNORMAL LOW (ref 17.9–39.5)
TIBC: 314 ug/dL (ref 250–450)
UIBC: 263 ug/dL

## 2022-11-15 ENCOUNTER — Telehealth: Payer: Self-pay | Admitting: *Deleted

## 2022-11-15 NOTE — Telephone Encounter (Signed)
Called the patient and said that [employer] to offer iv iron given low iron saturation and ckd . I spoke to patient that Dr.Rao would like to try IV iron to see if it will help to get hgb to a better number and usually the IV iron gives pt's feel better. Pt. Says that it does work because IV iron before. I told him the scheduler will call on Monday and get appt set up and he is good with that. He had venofer before

## 2022-11-21 ENCOUNTER — Inpatient Hospital Stay: Payer: Medicare Other

## 2022-11-21 VITALS — BP 142/65 | HR 51 | Temp 96.0°F | Resp 18

## 2022-11-21 DIAGNOSIS — D509 Iron deficiency anemia, unspecified: Secondary | ICD-10-CM | POA: Diagnosis not present

## 2022-11-21 DIAGNOSIS — D500 Iron deficiency anemia secondary to blood loss (chronic): Secondary | ICD-10-CM

## 2022-11-21 MED ORDER — SODIUM CHLORIDE 0.9 % IV SOLN
510.0000 mg | Freq: Once | INTRAVENOUS | Status: AC
  Administered 2022-11-21: 510 mg via INTRAVENOUS
  Filled 2022-11-21: qty 510

## 2022-11-21 MED ORDER — SODIUM CHLORIDE 0.9 % IV SOLN
Freq: Once | INTRAVENOUS | Status: AC
  Filled 2022-11-21: qty 250

## 2022-11-28 ENCOUNTER — Other Ambulatory Visit: Payer: Self-pay | Admitting: Oncology

## 2022-11-28 ENCOUNTER — Inpatient Hospital Stay: Payer: Medicare Other

## 2022-11-28 VITALS — BP 143/71 | HR 65 | Resp 18

## 2022-11-28 DIAGNOSIS — D500 Iron deficiency anemia secondary to blood loss (chronic): Secondary | ICD-10-CM

## 2022-11-28 DIAGNOSIS — D509 Iron deficiency anemia, unspecified: Secondary | ICD-10-CM | POA: Diagnosis not present

## 2022-11-28 MED ORDER — SODIUM CHLORIDE 0.9 % IV SOLN
510.0000 mg | INTRAVENOUS | Status: DC
Start: 2022-11-28 — End: 2022-11-28
  Administered 2022-11-28: 510 mg via INTRAVENOUS
  Filled 2022-11-28: qty 510

## 2022-11-28 MED ORDER — SODIUM CHLORIDE 0.9 % IV SOLN
Freq: Once | INTRAVENOUS | Status: AC
  Filled 2022-11-28: qty 250

## 2023-02-11 ENCOUNTER — Inpatient Hospital Stay (HOSPITAL_BASED_OUTPATIENT_CLINIC_OR_DEPARTMENT_OTHER): Payer: Medicare Other | Admitting: Oncology

## 2023-02-11 ENCOUNTER — Inpatient Hospital Stay: Payer: Medicare Other | Attending: Oncology

## 2023-02-11 ENCOUNTER — Encounter: Payer: Self-pay | Admitting: Oncology

## 2023-02-11 VITALS — BP 148/65 | HR 51 | Temp 96.6°F | Resp 19 | Wt 186.8 lb

## 2023-02-11 DIAGNOSIS — D500 Iron deficiency anemia secondary to blood loss (chronic): Secondary | ICD-10-CM | POA: Insufficient documentation

## 2023-02-11 DIAGNOSIS — N183 Chronic kidney disease, stage 3 unspecified: Secondary | ICD-10-CM

## 2023-02-11 DIAGNOSIS — D509 Iron deficiency anemia, unspecified: Secondary | ICD-10-CM

## 2023-02-11 DIAGNOSIS — D631 Anemia in chronic kidney disease: Secondary | ICD-10-CM

## 2023-02-11 DIAGNOSIS — Z803 Family history of malignant neoplasm of breast: Secondary | ICD-10-CM | POA: Insufficient documentation

## 2023-02-11 DIAGNOSIS — K254 Chronic or unspecified gastric ulcer with hemorrhage: Secondary | ICD-10-CM | POA: Insufficient documentation

## 2023-02-11 DIAGNOSIS — Z87891 Personal history of nicotine dependence: Secondary | ICD-10-CM | POA: Insufficient documentation

## 2023-02-11 LAB — CBC
HCT: 30.4 % — ABNORMAL LOW (ref 39.0–52.0)
Hemoglobin: 9.6 g/dL — ABNORMAL LOW (ref 13.0–17.0)
MCH: 28.5 pg (ref 26.0–34.0)
MCHC: 31.6 g/dL (ref 30.0–36.0)
MCV: 90.2 fL (ref 80.0–100.0)
Platelets: 190 10*3/uL (ref 150–400)
RBC: 3.37 MIL/uL — ABNORMAL LOW (ref 4.22–5.81)
RDW: 15.4 % (ref 11.5–15.5)
WBC: 5.9 10*3/uL (ref 4.0–10.5)
nRBC: 0 % (ref 0.0–0.2)

## 2023-02-11 LAB — IRON AND TIBC
Iron: 58 ug/dL (ref 45–182)
Saturation Ratios: 19 % (ref 17.9–39.5)
TIBC: 312 ug/dL (ref 250–450)
UIBC: 254 ug/dL

## 2023-02-11 LAB — FERRITIN: Ferritin: 129 ng/mL (ref 24–336)

## 2023-02-11 NOTE — Progress Notes (Signed)
 Hematology/Oncology Consult note [facility]  Telephone:([phone] [phone]  Patient Care Team: Melanee Annah BROCKS, MD as PCP - General (Oncology) Moishe Suzen LABOR, NP as Nurse Practitioner (Nurse Practitioner) Aundria Ladell POUR, MD as Consulting Physician (Gastroenterology)   Name of the patient: Tommy Heath  [id number]  08-11-1933   Date of visit: 02/11/23  Diagnosis-anemia of chronic kidney disease along with component of iron  deficiency  Chief complaint/ Reason for visit-routine follow-up of anemia  Heme/Onc history:  Patient is a 89 year old male who has a history of 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  Also noted to have nonbleeding colonic angiectasia in 2017 treated with APC.  He has received IV iron  in the past.  He also has stage III/stage IV chronic kidney disease.  He has not required any EPO so far   Interval history-he is feeling well overall but is concerned about co-pay associated with his IV iron  infusions.  ECOG PS- 1 Pain scale- 0   Review of systems- Review of Systems  Constitutional:  Negative for chills, fever, malaise/fatigue and weight loss.  HENT:  Negative for congestion, ear discharge and nosebleeds.   Eyes:  Negative for blurred vision.  Respiratory:  Negative for cough, hemoptysis, sputum production, shortness of breath and wheezing.   Cardiovascular:  Negative for chest pain, palpitations, orthopnea and claudication.  Gastrointestinal:  Negative for abdominal pain, blood in stool, constipation, diarrhea, heartburn, melena, nausea and vomiting.  Genitourinary:  Negative for dysuria, flank pain, frequency, hematuria and urgency.  Musculoskeletal:  Negative for back pain, joint pain and myalgias.  Skin:  Negative for rash.  Neurological:  Negative for dizziness, tingling, focal weakness, seizures, weakness and headaches.  Endo/Heme/Allergies:  Does not bruise/bleed  easily.  Psychiatric/Behavioral:  Negative for depression and suicidal ideas. The patient does not have insomnia.       No Known Allergies   Past Medical History:  Diagnosis Date   Allergic rhinitis    Anemia    AVM (arteriovenous malformation) of colon    BPH (benign prostatic hyperplasia)    Cataract cortical, senile    Colon polyps 02/2005, 2012   TUBULAR ADENOMA   Diabetes mellitus type 2, uncomplicated (HCC)    Duodenal ulcer    w/ gastric ulcer   Glaucoma    H. pylori infection    H. pylori infection    History of blood product transfusion    History of blood transfusion 02/2005   hgb was 6   History of duodenal ulcer 02/2005   The lesion was 5 mm in largest dimension.   Hyperlipidemia    Hypertension    Sleep apnea    BiPap     Past Surgical History:  Procedure Laterality Date   COLONOSCOPY     07/2010, 09/09/2002, 10/15/2002, 02/16/2005, Adenomatous polyps, repeat 5 years, Dr Viktoria   COLONOSCOPY WITH PROPOFOL  N/A 03/01/2015   Procedure: COLONOSCOPY WITH PROPOFOL ;  Surgeon: Lamar ONEIDA Viktoria, MD;  Location: [facility] ENDOSCOPY;  Service: Endoscopy;  Laterality: N/A;   ESOPHAGOGASTRODUODENOSCOPY     08/17/2010, 02/16/2005, no repeat per RTE   ESOPHAGOGASTRODUODENOSCOPY (EGD) WITH PROPOFOL  N/A 03/01/2015   Procedure: ESOPHAGOGASTRODUODENOSCOPY (EGD) WITH PROPOFOL ;  Surgeon: Lamar ONEIDA Viktoria, MD;  Location: [facility] ENDOSCOPY;  Service: Endoscopy;  Laterality: N/A;   ESOPHAGOGASTRODUODENOSCOPY (EGD) WITH PROPOFOL  N/A 05/15/2016   Procedure: ESOPHAGOGASTRODUODENOSCOPY (EGD) WITH PROPOFOL ;  Surgeon: Viktoria Lamar ONEIDA, MD;  Location: [facility] ENDOSCOPY;  Service: Endoscopy;  Laterality:  N/A;    Social History   Socioeconomic History   Marital status: Single    Spouse name: Not on file   Number of children: Not on file   Years of education: Not on file   Highest education level: Not on file  Occupational History   Not on file  Tobacco Use   Smoking status: Former    Current  packs/day: 0.00    Average packs/day: 1 pack/day for 40.0 years (40.0 ttl pk-yrs)    Types: Cigarettes    Start date: 01/07/1953    Quit date: 01/07/1993    Years since quitting: 30.1   Smokeless tobacco: Never  Vaping Use   Vaping status: Never Used  Substance and Sexual Activity   Alcohol use: No    Alcohol/week: 0.0 standard drinks of alcohol   Drug use: No   Sexual activity: Never  Other Topics Concern   Not on file  Social History Narrative   Not on file   Social Drivers of Health   Financial Resource Strain: Low Risk  (11/20/2016)   Received from [facility], [facility]   Overall Financial Resource Strain (CARDIA)    Difficulty of Paying Living Expenses: Not very hard  Food Insecurity: No Food Insecurity (11/20/2016)   Received from [facility], [facility]   Hunger Vital Sign    Worried About Running Out of Food in the Last Year: Never true    Ran Out of Food in the Last Year: Never true  Transportation Needs: No Transportation Needs (11/20/2016)   Received from [facility], [facility]   PRAPARE - Transportation    Lack of Transportation (Medical): No    Lack of Transportation (Non-Medical): No  Physical Activity: Insufficiently Active (11/20/2016)   Received from [facility], [facility]   Exercise Vital Sign    Days of Exercise per Week: 1 day    Minutes of Exercise per Session: 30 min  Stress: No Stress Concern Present (11/20/2016)   Received from [facility], [facility]   [employer] of Occupational Health - Occupational Stress Questionnaire    Feeling of Stress : Not at all  Social Connections: Moderately Integrated (11/20/2016)   Received from [facility], [facility]   Social Connection and Isolation Panel [NHANES]    Frequency of  Communication with Friends and Family: More than three times a week    Frequency of Social Gatherings with Friends and Family: More than three times a week    Attends Religious Services: More than 4 times per year    Active Member of [employer] or Organizations: Yes    Attends Engineer, Structural: More than 4 times per year    Marital Status: Divorced  Catering Manager Violence: Not on file    Family History  Problem Relation Age of Onset   Diabetes Mother    Breast cancer Sister    Prostate cancer Neg Hx    Bladder Cancer Neg Hx    Kidney cancer Neg Hx      Current Outpatient Medications:    Cholecalciferol 50 MCG (2000 UT) TABS, Take by mouth., Disp: , Rfl:    trospium (SANCTURA) 20 MG tablet, Take by mouth., Disp: , Rfl:    amLODipine (NORVASC) 10 MG tablet, Take 10 mg by mouth daily. , Disp: , Rfl:    atenolol (TENORMIN) 50  MG tablet, TAKE ONE TABLET BY MOUTH EVERY MORNING FOR BLOOD PRESSURE, Disp: , Rfl:    cyanocobalamin 1000 MCG tablet, Take 1 tablet by mouth every morning., Disp: , Rfl:    ferrous sulfate 325 (65 FE) MG tablet, Take 1 tablet by mouth 2 (two) times daily with a meal., Disp: , Rfl:    finasteride  (PROSCAR ) 5 MG tablet, Take 1 tablet (5 mg total) by mouth daily., Disp: 90 tablet, Rfl: 3   glimepiride (AMARYL) 2 MG tablet, Take 1 tablet by mouth every morning., Disp: , Rfl:    glipiZIDE (GLUCOTROL) 10 MG tablet, Take 10 mg by mouth daily before breakfast., Disp: , Rfl:    latanoprost (XALATAN) 0.005 % ophthalmic solution, Place 1 drop into both eyes at bedtime., Disp: , Rfl:    metFORMIN (GLUCOPHAGE) 1000 MG tablet, Take 500 mg by mouth 2 (two) times daily with a meal. , Disp: , Rfl:    metFORMIN (GLUCOPHAGE) 500 MG tablet, , Disp: , Rfl:    pioglitazone (ACTOS) 30 MG tablet, Take 45 mg by mouth daily. , Disp: , Rfl:    ramipril (ALTACE) 10 MG capsule, Take 10 mg by mouth daily., Disp: , Rfl:    rosuvastatin (CRESTOR) 20 MG tablet, TAKE ONE-HALF TABLET BY  MOUTH AT BEDTIME FOR HIGH BLOOD CHOLESTEROL, Disp: , Rfl:    sitaGLIPtin (JANUVIA) 100 MG tablet, Take 100 mg by mouth daily. , Disp: , Rfl:    tamsulosin  (FLOMAX ) 0.4 MG CAPS capsule, Take 0.4 mg by mouth daily., Disp: , Rfl:  No current facility-administered medications for this visit.  Facility-Administered Medications Ordered in Other Visits:    iron  sucrose (VENOFER ) injection 200 mg, 200 mg, Intravenous, Once, Brahmanday, Govinda R, MD  Physical exam:  Vitals:   02/11/23 0916  BP: (!) 148/65  Pulse: (!) 51  Resp: 19  Temp: (!) 96.6 F (35.9 C)  TempSrc: Tympanic  SpO2: 99%  Weight: 186 lb 12.8 oz (84.7 kg)   Physical Exam Cardiovascular:     Rate and Rhythm: Normal rate and regular rhythm.     Heart sounds: Normal heart sounds.  Pulmonary:     Effort: Pulmonary effort is normal.  Skin:    General: Skin is warm and dry.  Neurological:     Mental Status: He is alert and oriented to person, place, and time.         Latest Ref Rng & Units 08/13/2022   10:55 AM  CMP  Glucose 70 - 99 mg/dL 839   BUN 8 - 23 mg/dL 34   Creatinine 9.38 - 1.24 mg/dL 7.80   Sodium 864 - 854 mmol/L 139   Potassium 3.5 - 5.1 mmol/L 3.4   Chloride 98 - 111 mmol/L 107   CO2 22 - 32 mmol/L 23   Calcium 8.9 - 10.3 mg/dL 8.8       Latest Ref Rng & Units 02/11/2023    8:51 AM  CBC  WBC 4.0 - 10.5 K/uL 5.9   Hemoglobin 13.0 - 17.0 g/dL 9.6   Hematocrit 60.9 - 52.0 % 30.4   Platelets 150 - 400 K/uL 190     No images are attached to the encounter.  No results found.   Assessment and plan- Patient is a 89 y.o. male here for routine follow-up of anemia of chronic kidney disease  Patients hemoglobin presently is 9.6 which is improved as compared to 8.4 before after receiving IV iron .  Ferritin levels are normal at 129 with an  iron  saturation of 19%.  He does not require any IV iron  at this time.  CBC ferritin and iron  studies in 3 and 6 months and I will see him back in 6 months   Visit  Diagnosis 1. Iron  deficiency anemia due to chronic blood loss   2. Anemia of chronic kidney failure, stage 3 (moderate) (HCC)      Dr. Annah Skene, MD, MPH [facility] [phone] 02/11/2023 12:46 PM

## 2023-02-14 ENCOUNTER — Ambulatory Visit: Payer: Medicare Other | Admitting: Oncology

## 2023-02-14 ENCOUNTER — Other Ambulatory Visit: Payer: Medicare Other

## 2023-03-01 ENCOUNTER — Encounter: Payer: Self-pay | Admitting: Emergency Medicine

## 2023-03-01 ENCOUNTER — Ambulatory Visit
Admission: EM | Admit: 2023-03-01 | Discharge: 2023-03-01 | Disposition: A | Discharge status: Home / Self Care | Payer: Medicare Other | Attending: Emergency Medicine | Admitting: Emergency Medicine

## 2023-03-01 DIAGNOSIS — M62838 Other muscle spasm: Secondary | ICD-10-CM | POA: Diagnosis not present

## 2023-03-01 MED ORDER — BACLOFEN 10 MG PO TABS: 10.0000 mg | ORAL_TABLET | Freq: Three times a day (TID) | ORAL | 0 refills | Status: AC

## 2023-03-01 MED ORDER — IBUPROFEN 600 MG PO TABS: 600.0000 mg | ORAL_TABLET | Freq: Four times a day (QID) | ORAL | 0 refills | Status: AC | PRN

## 2023-03-01 NOTE — Discharge Instructions (Addendum)
Take the ibuprofen, 600 mg every 6 hours with food, on a schedule for the next 48 hours and then as needed.  Take the baclofen, 10 mg every 8 hours, on a schedule for the next 48 hours and then as needed.  Apply moist heat to your neck for 30 minutes at a time 2-3 times a day to improve blood flow to the area and help remove the lactic acid causing the spasm.  Follow the neck exercises given at discharge.  Return for reevaluation for any new or worsening symptoms.  

## 2023-03-01 NOTE — ED Triage Notes (Signed)
 Pt c/o stiff neck. Started about 3 days ago. No known injury. He states the pain is in the back of the neck and hurts to turn his head from side to side.

## 2023-03-01 NOTE — ED Provider Notes (Signed)
 [facility]    CSN: [id number] Arrival date & time: 03/01/23  1237      History   Chief Complaint Chief Complaint  Patient presents with   Torticollis    HPI Tommy Heath is a 89 y.o. male.   HPI  89 year old male with past medical history significant for hyperlipidemia, type 2 diabetes, benign fibroma of prostate, sleep apnea, and iron deficiency anemia presents for evaluation of neck stiffness that started 3 days ago.  He thinks it is because he sleeps in awkward positions in his chair.  He denies any injury.  He also denies any numbness, tingling, weakness in his extremities.  Past Medical History:  Diagnosis Date   Allergic rhinitis    Anemia    AVM (arteriovenous malformation) of colon    BPH (benign prostatic hyperplasia)    Cataract cortical, senile    Colon polyps 02/2005, 2012   TUBULAR ADENOMA   Diabetes mellitus type 2, uncomplicated (HCC)    Duodenal ulcer    w/ gastric ulcer   Glaucoma    H. pylori infection    H. pylori infection    History of blood product transfusion    History of blood transfusion 02/2005   hgb was 6   History of duodenal ulcer 02/2005   The lesion was 5 mm in largest dimension.   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   Angiodysplasia of colon without hemorrhage 07/04/2015   GERD without esophagitis 07/04/2015   Absolute anemia 01/17/2015   Benign fibroma of prostate 01/17/2015   Type 2 diabetes mellitus (HCC) 01/17/2015   HLD (hyperlipidemia) 01/17/2015   BP (high blood pressure) 01/17/2015   Apnea, sleep 01/17/2015   Iron deficiency anemia due to chronic blood loss 01/17/2015   Enlarged prostate without lower urinary tract symptoms (luts) 01/17/2015    Past Surgical History:  Procedure Laterality Date   COLONOSCOPY     07/2010, 09/09/2002,  10/15/2002, 02/16/2005, Adenomatous polyps, repeat 5 years, Dr Mechele Collin   COLONOSCOPY WITH PROPOFOL N/A 03/01/2015   Procedure: COLONOSCOPY WITH PROPOFOL;  Surgeon: Scot Jun, MD;  Location: [facility] ENDOSCOPY;  Service: Endoscopy;  Laterality: N/A;   ESOPHAGOGASTRODUODENOSCOPY     08/17/2010, 02/16/2005, no repeat per RTE   ESOPHAGOGASTRODUODENOSCOPY (EGD) WITH PROPOFOL N/A 03/01/2015   Procedure: ESOPHAGOGASTRODUODENOSCOPY (EGD) WITH PROPOFOL;  Surgeon: Scot Jun, MD;  Location: [facility] ENDOSCOPY;  Service: Endoscopy;  Laterality: N/A;   ESOPHAGOGASTRODUODENOSCOPY (EGD) WITH PROPOFOL N/A 05/15/2016   Procedure: ESOPHAGOGASTRODUODENOSCOPY (EGD) WITH PROPOFOL;  Surgeon: Scot Jun, MD;  Location: [facility] ENDOSCOPY;  Service: Endoscopy;  Laterality: N/A;       Home Medications    Prior to Admission medications   Medication Sig Start Date End Date Taking? Authorizing Provider  amLODipine (NORVASC) 10 MG tablet Take 10 mg by mouth daily.  07/27/14  Yes [provider]  atenolol (TENORMIN) 50 MG tablet TAKE ONE TABLET BY MOUTH EVERY MORNING FOR BLOOD PRESSURE 05/01/20  Yes [provider]  baclofen (LIORESAL) 10 MG tablet Take 1 tablet (10 mg total) by mouth 3 (three) times daily. 03/01/23  Yes Becky Augusta, NP  Cholecalciferol 50 MCG (2000 UT) TABS Take by mouth. 03/22/22  Yes [provider]  cyanocobalamin 1000 MCG tablet Take 1 tablet by mouth every  morning. 08/16/15  Yes [provider]  ferrous sulfate 325 (65 FE) MG tablet Take 1 tablet by mouth 2 (two) times daily with a meal. 08/16/15  Yes [provider]  finasteride (PROSCAR) 5 MG tablet Take 1 tablet (5 mg total) by mouth daily. 01/30/18  Yes Vanna Scotland, MD  glimepiride (AMARYL) 2 MG tablet Take 1 tablet by mouth every morning. 05/01/20  Yes [provider]  glipiZIDE (GLUCOTROL) 10 MG tablet Take 10 mg by mouth daily before breakfast.   Yes [provider]  ibuprofen  (ADVIL) 600 MG tablet Take 1 tablet (600 mg total) by mouth every 6 (six) hours as needed. 03/01/23  Yes Becky Augusta, NP  latanoprost (XALATAN) 0.005 % ophthalmic solution Place 1 drop into both eyes at bedtime. 04/17/20  Yes [provider]  metFORMIN (GLUCOPHAGE) 1000 MG tablet Take 500 mg by mouth 2 (two) times daily with a meal.  12/27/14  Yes [provider]  metFORMIN (GLUCOPHAGE) 500 MG tablet  05/01/20  Yes [provider]  pioglitazone (ACTOS) 30 MG tablet Take 45 mg by mouth daily.    Yes [provider]  ramipril (ALTACE) 10 MG capsule Take 10 mg by mouth daily.   Yes [provider]  rosuvastatin (CRESTOR) 20 MG tablet TAKE ONE-HALF TABLET BY MOUTH AT BEDTIME FOR HIGH BLOOD CHOLESTEROL 01/18/21  Yes [provider]  sitaGLIPtin (JANUVIA) 100 MG tablet Take 100 mg by mouth daily.  06/28/14  Yes [provider]  tamsulosin (FLOMAX) 0.4 MG CAPS capsule Take 0.4 mg by mouth daily.   Yes [provider]  trospium (SANCTURA) 20 MG tablet Take by mouth. 05/02/22  Yes [provider]    Family History Family History  Problem Relation Age of Onset   Diabetes Mother    Breast cancer Sister    Prostate cancer Neg Hx    Bladder Cancer Neg Hx    Kidney cancer Neg Hx     Social History Social History   Tobacco Use   Smoking status: Former    Current packs/day: 0.00    Average packs/day: 1 pack/day for 40.0 years (40.0 ttl pk-yrs)    Types: Cigarettes    Start date: 01/07/1953    Quit date: 01/07/1993    Years since quitting: 30.1   Smokeless tobacco: Never  Vaping Use   Vaping status: Never Used  Substance Use Topics   Alcohol use: No    Alcohol/week: 0.0 standard drinks of alcohol   Drug use: No     Allergies   Patient has no known allergies.   Review of Systems Review of Systems  Musculoskeletal:  Positive for neck stiffness. Negative for neck pain.  Neurological:  Negative for weakness and  numbness.     Physical Exam Triage Vital Signs ED Triage Vitals  Encounter Vitals Group     BP 03/01/23 1424 (!) 175/82     Systolic BP Percentile --      Diastolic BP Percentile --      Pulse Rate 03/01/23 1424 (!) 51     Resp 03/01/23 1424 16     Temp 03/01/23 1424 98.2 F (36.8 C)     Temp Source 03/01/23 1424 Oral     SpO2 03/01/23 1424 95 %     Weight 03/01/23 1423 186 lb 11.7 oz (84.7 kg)     Height 03/01/23 1423 5\' 6"  (1.676 m)     Head Circumference --      Peak  Flow --      Pain Score 03/01/23 1421 6     Pain Loc --      Pain Education --      Exclude from Growth Chart --    No data found.  Updated Vital Signs BP (!) 175/82 (BP Location: Right Arm)   Pulse (!) 51   Temp 98.2 F (36.8 C) (Oral)   Resp 16   Ht 5\' 6"  (1.676 m)   Wt 186 lb 11.7 oz (84.7 kg)   SpO2 95%   BMI 30.14 kg/m   Visual Acuity Right Eye Distance:   Left Eye Distance:   Bilateral Distance:    Right Eye Near:   Left Eye Near:    Bilateral Near:     Physical Exam Vitals and nursing note reviewed.  Constitutional:      Appearance: Normal appearance. He is not ill-appearing.  Musculoskeletal:        General: Tenderness present. No swelling or signs of injury.  Skin:    General: Skin is warm and dry.     Capillary Refill: Capillary refill takes less than 2 seconds.  Neurological:     General: No focal deficit present.     Mental Status: He is alert and oriented to person, place, and time.      UC Treatments / Results  Labs (all labs ordered are listed, but only abnormal results are displayed) Labs Reviewed - No data to display  EKG   Radiology No results found.  Procedures Procedures (including critical care time)  Medications Ordered in UC Medications - No data to display  Initial Impression / Assessment and Plan / UC Course  I have reviewed the triage vital signs and the nursing notes.  Pertinent labs & imaging results that were available during my care of  the patient were reviewed by me and considered in my medical decision making (see chart for details).   Patient is a nontoxic-appearing 89 year old male presenting for evaluation of 3 days worth of neck stiffness as outlined HPI above.  In the exam room his neck is in normal axial alignment and he has no spinous process tenderness or step-off.  He does have limited lateral rotation and flexion of his neck secondary to pain but he has full extension.  The patient does have palpable muscle tension and spasm in his bilateral cervical paraspinous region.  I suspect most likely that he is incurred neck spasm as result of sleeping in awkward position.  I will discharge him home with a diagnosis of neck spasm and treat him with 60 mg ibuprofen every 6 hours with food along with baclofen 10 mg every 8 hours to help with muscle spasm and tension.  I will also give him home physical therapy exercises to perform.  We discussed using moist heat to improve blood flow to the muscle group and help flush the lactic acid and aid in healing.  Return precautions reviewed.   Final Clinical Impressions(s) / UC Diagnoses   Final diagnoses:  Muscle spasms of neck     Discharge Instructions      Take the ibuprofen, 600 mg every 6 hours with food, on a schedule for the next 48 hours and then as needed.  Take the baclofen, 10 mg every 8 hours, on a schedule for the next 48 hours and then as needed.  Apply moist heat to your neck for 30 minutes at a time 2-3 times a day to improve blood flow to the  area and help remove the lactic acid causing the spasm.  Follow the neck exercises given at discharge.  Return for reevaluation for any new or worsening symptoms.      ED Prescriptions     Medication Sig Dispense Auth. Provider   ibuprofen (ADVIL) 600 MG tablet Take 1 tablet (600 mg total) by mouth every 6 (six) hours as needed. 30 tablet Becky Augusta, NP   baclofen (LIORESAL) 10 MG tablet Take 1 tablet (10 mg total)  by mouth 3 (three) times daily. 30 each Becky Augusta, NP      PDMP not reviewed this encounter.   Becky Augusta, NP 03/01/23 1451

## 2023-03-05 ENCOUNTER — Other Ambulatory Visit: Payer: Medicare Other

## 2023-03-05 ENCOUNTER — Ambulatory Visit: Payer: Medicare Other | Admitting: Oncology

## 2023-05-12 ENCOUNTER — Inpatient Hospital Stay: Payer: Medicare Other | Attending: Oncology

## 2023-07-18 ENCOUNTER — Ambulatory Visit
Admission: EM | Admit: 2023-07-18 | Discharge: 2023-07-18 | Disposition: A | Discharge status: Home / Self Care | Attending: Family Medicine | Admitting: Family Medicine

## 2023-07-18 ENCOUNTER — Encounter: Payer: Self-pay | Admitting: Emergency Medicine

## 2023-07-18 DIAGNOSIS — N179 Acute kidney failure, unspecified: Secondary | ICD-10-CM | POA: Diagnosis present

## 2023-07-18 DIAGNOSIS — D631 Anemia in chronic kidney disease: Secondary | ICD-10-CM | POA: Diagnosis present

## 2023-07-18 DIAGNOSIS — N189 Chronic kidney disease, unspecified: Secondary | ICD-10-CM | POA: Diagnosis present

## 2023-07-18 DIAGNOSIS — R739 Hyperglycemia, unspecified: Secondary | ICD-10-CM | POA: Diagnosis present

## 2023-07-18 LAB — CBC WITH DIFFERENTIAL/PLATELET
Abs Immature Granulocytes: 0.03 K/uL (ref 0.00–0.07)
Basophils Absolute: 0 K/uL (ref 0.0–0.1)
Basophils Relative: 1 %
Eosinophils Absolute: 0.1 K/uL (ref 0.0–0.5)
Eosinophils Relative: 2 %
HCT: 28.3 % — ABNORMAL LOW (ref 39.0–52.0)
Hemoglobin: 9.1 g/dL — ABNORMAL LOW (ref 13.0–17.0)
Immature Granulocytes: 1 %
Lymphocytes Relative: 16 %
Lymphs Abs: 0.9 K/uL (ref 0.7–4.0)
MCH: 27.2 pg (ref 26.0–34.0)
MCHC: 32.2 g/dL (ref 30.0–36.0)
MCV: 84.5 fL (ref 80.0–100.0)
Monocytes Absolute: 0.5 K/uL (ref 0.1–1.0)
Monocytes Relative: 8 %
Neutro Abs: 4.2 K/uL (ref 1.7–7.7)
Neutrophils Relative %: 72 %
Platelets: 205 K/uL (ref 150–400)
RBC: 3.35 MIL/uL — ABNORMAL LOW (ref 4.22–5.81)
RDW: 15.3 % (ref 11.5–15.5)
WBC: 5.7 K/uL (ref 4.0–10.5)
nRBC: 0 % (ref 0.0–0.2)

## 2023-07-18 LAB — COMPREHENSIVE METABOLIC PANEL WITH GFR
ALT: 11 U/L (ref 0–44)
AST: 12 U/L — ABNORMAL LOW (ref 15–41)
Albumin: 3.4 g/dL — ABNORMAL LOW (ref 3.5–5.0)
Alkaline Phosphatase: 47 U/L (ref 38–126)
Anion gap: 8 (ref 5–15)
BUN: 33 mg/dL — ABNORMAL HIGH (ref 8–23)
CO2: 24 mmol/L (ref 22–32)
Calcium: 8.8 mg/dL — ABNORMAL LOW (ref 8.9–10.3)
Chloride: 103 mmol/L (ref 98–111)
Creatinine, Ser: 2.42 mg/dL — ABNORMAL HIGH (ref 0.61–1.24)
GFR, Estimated: 25 mL/min — ABNORMAL LOW (ref >60)
Glucose, Bld: 417 mg/dL — ABNORMAL HIGH (ref 70–99)
Potassium: 3.7 mmol/L (ref 3.5–5.1)
Sodium: 135 mmol/L (ref 135–145)
Total Bilirubin: 0.6 mg/dL (ref 0.0–1.2)
Total Protein: 6.9 g/dL (ref 6.5–8.1)

## 2023-07-18 LAB — URINALYSIS, W/ REFLEX TO CULTURE (INFECTION SUSPECTED)
Bilirubin Urine: NEGATIVE
Glucose, UA: >=500 mg/dL — AB
Ketones, ur: NEGATIVE mg/dL
Leukocytes,Ua: NEGATIVE
Nitrite: NEGATIVE
Protein, ur: 30 mg/dL — AB
Specific Gravity, Urine: 1.015 (ref 1.005–1.030)
pH: 5.5 (ref 5.0–8.0)

## 2023-07-18 LAB — GLUCOSE, CAPILLARY: Glucose-Capillary: 390 mg/dL — ABNORMAL HIGH (ref 70–99)

## 2023-07-18 NOTE — Discharge Instructions (Addendum)
 Please contact your PCP at the [facility] to see if they can see you in clinic today.  However, given your worsening renal function and your elevated blood sugar I am concerned that you need some insulin to bring your blood sugar down and you may need to be started on insulin.  I am therefore recommending that you go to the emergency department at the [facility] in [place].

## 2023-07-18 NOTE — ED Notes (Signed)
 Patient is being discharged from the Urgent Care and sent to the [facility] Emergency Department via private vehicle . Per Venetia Motto, NP, patient is in need of higher level of care due to Hyperglycemia and low Hemiglobin. Patient is aware and verbalizes understanding of plan of care.  Vitals:   07/18/23 1021  BP: (!) 156/77  Pulse: 66  Resp: 15  Temp: 98 F (36.7 C)  SpO2: 95%

## 2023-07-18 NOTE — ED Triage Notes (Signed)
 Patient is diabetic.  Patient states that his BG has been running around 200 for a month.  Patient states that his PCP discontinued Metformin a month abo.  Patient denies any pain.

## 2023-07-18 NOTE — ED Provider Notes (Signed)
 [facility]    CSN: [id number] Arrival date & time: 07/18/23  1010      History   Chief Complaint Chief Complaint  Patient presents with   Hyperglycemia    HPI Tommy Heath is a 89 y.o. male.   HPI  89 year old male with past medical history significant for type 2 diabetes with hyperglycemia, high blood pressure, sleep apnea, IDA who receives iron  infusions, GERD, BPH, and anemia secondary to stage III chronic kidney disease presents for evaluation of elevated blood sugar.  He reports that his blood sugar has been running high, around 200, for the last month since his PCP stopped metformin.  Past Medical History:  Diagnosis Date   Allergic rhinitis    Anemia    AVM (arteriovenous malformation) of colon    BPH (benign prostatic hyperplasia)    Cataract cortical, senile    Colon polyps 02/2005, 2012   TUBULAR ADENOMA   Diabetes mellitus type 2, uncomplicated (HCC)    Duodenal ulcer    w/ gastric ulcer   Glaucoma    H. pylori infection    H. pylori infection    History of blood product transfusion    History of blood transfusion 02/2005   hgb was 6   History of duodenal ulcer 02/2005   The lesion was 5 mm in largest dimension.   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   Angiodysplasia of colon without hemorrhage 07/04/2015   GERD without esophagitis 07/04/2015   Absolute anemia 01/17/2015   Benign fibroma of prostate 01/17/2015   Type 2 diabetes mellitus (HCC) 01/17/2015   HLD (hyperlipidemia) 01/17/2015   BP (high blood pressure) 01/17/2015   Apnea, sleep 01/17/2015   Iron  deficiency anemia due to chronic blood loss 01/17/2015   Enlarged prostate without lower urinary tract symptoms (luts) 01/17/2015    Past Surgical History:  Procedure Laterality Date   COLONOSCOPY     07/2010,  09/09/2002, 10/15/2002, 02/16/2005, Adenomatous polyps, repeat 5 years, Dr Viktoria   COLONOSCOPY WITH PROPOFOL  N/A 03/01/2015   Procedure: COLONOSCOPY WITH PROPOFOL ;  Surgeon: Lamar ONEIDA Viktoria, MD;  Location: [facility] ENDOSCOPY;  Service: Endoscopy;  Laterality: N/A;   ESOPHAGOGASTRODUODENOSCOPY     08/17/2010, 02/16/2005, no repeat per RTE   ESOPHAGOGASTRODUODENOSCOPY (EGD) WITH PROPOFOL  N/A 03/01/2015   Procedure: ESOPHAGOGASTRODUODENOSCOPY (EGD) WITH PROPOFOL ;  Surgeon: Lamar ONEIDA Viktoria, MD;  Location: [facility] ENDOSCOPY;  Service: Endoscopy;  Laterality: N/A;   ESOPHAGOGASTRODUODENOSCOPY (EGD) WITH PROPOFOL  N/A 05/15/2016   Procedure: ESOPHAGOGASTRODUODENOSCOPY (EGD) WITH PROPOFOL ;  Surgeon: Viktoria Lamar ONEIDA, MD;  Location: [facility] ENDOSCOPY;  Service: Endoscopy;  Laterality: N/A;       Home Medications    Prior to Admission medications   Medication Sig Start Date End Date Taking? Authorizing Provider  amLODipine (NORVASC) 10 MG tablet Take 10 mg by mouth daily.  07/27/14   [provider]  atenolol (TENORMIN) 50 MG tablet TAKE ONE TABLET BY MOUTH EVERY MORNING FOR BLOOD PRESSURE 05/01/20   [provider]  baclofen  (LIORESAL ) 10 MG tablet Take 1 tablet (10 mg total) by mouth 3 (three) times daily. 03/01/23   Bernardino Ditch, NP  Cholecalciferol 50 MCG (2000 UT) TABS Take by mouth. 03/22/22   [provider]  cyanocobalamin 1000 MCG tablet Take 1 tablet by mouth every  morning. 08/16/15   [provider]  ferrous sulfate 325 (65 FE) MG tablet Take 1 tablet by mouth 2 (two) times daily with a meal. 08/16/15   [provider]  finasteride  (PROSCAR ) 5 MG tablet Take 1 tablet (5 mg total) by mouth daily. 01/30/18   Penne Knee, MD  glimepiride (AMARYL) 2 MG tablet Take 1 tablet by mouth every morning. 05/01/20   [provider]  glipiZIDE (GLUCOTROL) 10 MG tablet Take 10 mg by mouth daily before breakfast.    [provider]  ibuprofen  (ADVIL ) 600 MG tablet  Take 1 tablet (600 mg total) by mouth every 6 (six) hours as needed. 03/01/23   Bernardino Ditch, NP  latanoprost (XALATAN) 0.005 % ophthalmic solution Place 1 drop into both eyes at bedtime. 04/17/20   [provider]  metFORMIN (GLUCOPHAGE) 1000 MG tablet Take 500 mg by mouth 2 (two) times daily with a meal.  12/27/14   [provider]  metFORMIN (GLUCOPHAGE) 500 MG tablet  05/01/20   [provider]  pioglitazone (ACTOS) 30 MG tablet Take 45 mg by mouth daily.     [provider]  ramipril (ALTACE) 10 MG capsule Take 10 mg by mouth daily.    [provider]  rosuvastatin (CRESTOR) 20 MG tablet TAKE ONE-HALF TABLET BY MOUTH AT BEDTIME FOR HIGH BLOOD CHOLESTEROL 01/18/21   [provider]  sitaGLIPtin (JANUVIA) 100 MG tablet Take 100 mg by mouth daily.  06/28/14   [provider]  tamsulosin  (FLOMAX ) 0.4 MG CAPS capsule Take 0.4 mg by mouth daily.    [provider]  trospium (SANCTURA) 20 MG tablet Take by mouth. 05/02/22   [provider]    Family History Family History  Problem Relation Age of Onset   Diabetes Mother    Breast cancer Sister    Prostate cancer Neg Hx    Bladder Cancer Neg Hx    Kidney cancer Neg Hx     Social History Social History   Tobacco Use   Smoking status: Former    Current packs/day: 0.00    Average packs/day: 1 pack/day for 40.0 years (40.0 ttl pk-yrs)    Types: Cigarettes    Start date: 01/07/1953    Quit date: 01/07/1993    Years since quitting: 30.5   Smokeless tobacco: Never  Vaping Use   Vaping status: Never Used  Substance Use Topics   Alcohol use: No    Alcohol/week: 0.0 standard drinks of alcohol   Drug use: No     Allergies   Patient has no known allergies.   Review of Systems Review of Systems  Constitutional:  Negative for fever.  Gastrointestinal:  Negative for nausea and vomiting.     Physical Exam Triage Vital Signs ED Triage Vitals  Encounter Vitals  Group     BP --      Girls Systolic BP Percentile --      Girls Diastolic BP Percentile --      Boys Systolic BP Percentile --      Boys Diastolic BP Percentile --      Pulse --      Resp --      Temp --      Temp src --      SpO2 --      Weight 07/18/23 1019 180 lb (81.6 kg)     Height 07/18/23 1019 5' 6 (1.676 m)     Head Circumference --  Peak Flow --      Pain Score 07/18/23 1018 0     Pain Loc --      Pain Education --      Exclude from Growth Chart --    No data found.  Updated Vital Signs BP (!) 156/77 (BP Location: Right Arm)   Pulse 66   Temp 98 F (36.7 C) (Oral)   Resp 15   Ht 5' 6 (1.676 m)   Wt 180 lb (81.6 kg)   SpO2 95%   BMI 29.05 kg/m   Visual Acuity Right Eye Distance:   Left Eye Distance:   Bilateral Distance:    Right Eye Near:   Left Eye Near:    Bilateral Near:     Physical Exam Vitals and nursing note reviewed.  Constitutional:      Appearance: Normal appearance. He is not ill-appearing.  HENT:     Head: Normocephalic and atraumatic.  Neurological:     Mental Status: He is alert.      UC Treatments / Results  Labs (all labs ordered are listed, but only abnormal results are displayed) Labs Reviewed  GLUCOSE, CAPILLARY - Abnormal; Notable for the following components:      Result Value   Glucose-Capillary 390 (*)    All other components within normal limits  CBC WITH DIFFERENTIAL/PLATELET - Abnormal; Notable for the following components:   RBC 3.35 (*)    Hemoglobin 9.1 (*)    HCT 28.3 (*)    All other components within normal limits  COMPREHENSIVE METABOLIC PANEL WITH GFR - Abnormal; Notable for the following components:   Glucose, Bld 417 (*)    BUN 33 (*)    Creatinine, Ser 2.42 (*)    Calcium 8.8 (*)    Albumin 3.4 (*)    AST 12 (*)    GFR, Estimated 25 (*)    All other components within normal limits  URINALYSIS, W/ REFLEX TO CULTURE (INFECTION SUSPECTED) - Abnormal; Notable for the following components:    Glucose, UA >=500 (*)    Hgb urine dipstick SMALL (*)    Protein, ur 30 (*)    Bacteria, UA RARE (*)    All other components within normal limits  CBG MONITORING, ED    EKG   Radiology No results found.  Procedures Procedures (including critical care time)  Medications Ordered in UC Medications - No data to display  Initial Impression / Assessment and Plan / UC Course  I have reviewed the triage vital signs and the nursing notes.  Pertinent labs & imaging results that were available during my care of the patient were reviewed by me and considered in my medical decision making (see chart for details).   Patient is a pleasant, nontoxic-appearing 89 year old male presenting for evaluation of hyperglycemia in the setting of his PCP stopping his metformin.  He is a patient at the [facility] and according to the [facility] notes he was taken off of regular metformin and put on metformin SA in late February.  Last month his PCP discontinued the metformin but it is difficult to determine why.  There is a notation in the pharmacy section that says renal some assuming is secondary to alterations in his renal function.  He denies any increased thirst or urination.  He also denies any weight loss.  He does endorse fatigue, but states that the about the same as normal given that he does suffer from iron  deficiency anemia.  He has been receiving iron   infusions but insurance stopped paying for them so he is attempting to get established at the [facility] to receive the iron  infusions.  Fingerstick blood sugar today is 390 the patient reports that he just ate 1 hour prior to arrival.  I will check a CBC to evaluate for worsening anemia, CMP to assess patient's renal function and serum glucose, and urinalysis to assess for any evidence of infection or glucosuria.  Urinalysis shows greater than 500 glucose with small hemoglobin and 30 protein.  Negative for leukocyte esterase, nitrites, or ketones.  Reflex microscopy shows 6-10  RBCs with rare bacteria.  CBC shows anemia with a low red count of 3.35 and low H&H of 9.1 and 28.3 respectively.  When compared to CBC from 02/11/2023 the patient has had a mild decrease in all levels as RBC count of the time was 3.37 and H&H was 9.6 and 30.4.  CMP shows hyperglycemia with a blood glucose of 417.  BUN and creatinine are both elevated at 33 and 2.42 respectively.  Mild hypocalcemia with a calcium of 8.8.  Albumin is also decreased at 3.4 and AST is down to 12.  GFR is estimated to be less than 25.  When compared to CMP dated 08/13/2022 there is a marked increase in worsening of his creatinine as at that time his creatinine was 2.19.  Hypocalcemia is stable.  Given patient's hyperglycemia I have recommended that he go to the emergency department at the [facility] in [place] as his sugars are 400 and he will need some insulin to bring it down.  He may also need to start on insulin to manage his blood sugar given that he has had to come off of the metformin for his worsening renal function.  His fatigue is most likely due to his worsening anemia.  It has been 3 to 4 months since he had an iron  infusion.   Final Clinical Impressions(s) / UC Diagnoses   Final diagnoses:  Hyperglycemia  AKI (acute kidney injury) (HCC)  Anemia due to chronic kidney disease, unspecified CKD stage     Discharge Instructions      Please contact your PCP at the [facility] to see if they can see you in clinic today.  However, given your worsening renal function and your elevated blood sugar I am concerned that you need some insulin to bring your blood sugar down and you may need to be started on insulin.  I am therefore recommending that you go to the emergency department at the [facility] in [facility].     ED Prescriptions   None    PDMP not reviewed this encounter.   Bernardino Ditch, NP 07/18/23 1113

## 2023-08-11 ENCOUNTER — Ambulatory Visit: Payer: Self-pay | Admitting: Oncology

## 2023-08-11 ENCOUNTER — Inpatient Hospital Stay: Payer: Medicare Other | Attending: Oncology

## 2023-08-11 ENCOUNTER — Inpatient Hospital Stay (HOSPITAL_BASED_OUTPATIENT_CLINIC_OR_DEPARTMENT_OTHER): Payer: Medicare Other | Admitting: Oncology

## 2023-08-11 ENCOUNTER — Encounter: Payer: Self-pay | Admitting: Oncology

## 2023-08-11 VITALS — BP 117/78 | HR 84 | Temp 97.6°F | Resp 18 | Ht 66.0 in | Wt 158.7 lb

## 2023-08-11 DIAGNOSIS — D631 Anemia in chronic kidney disease: Secondary | ICD-10-CM | POA: Insufficient documentation

## 2023-08-11 DIAGNOSIS — K254 Chronic or unspecified gastric ulcer with hemorrhage: Secondary | ICD-10-CM | POA: Insufficient documentation

## 2023-08-11 DIAGNOSIS — D500 Iron deficiency anemia secondary to blood loss (chronic): Secondary | ICD-10-CM | POA: Insufficient documentation

## 2023-08-11 DIAGNOSIS — N184 Chronic kidney disease, stage 4 (severe): Secondary | ICD-10-CM | POA: Diagnosis not present

## 2023-08-11 DIAGNOSIS — N183 Chronic kidney disease, stage 3 unspecified: Secondary | ICD-10-CM

## 2023-08-11 LAB — IRON AND TIBC
Iron: 42 ug/dL — ABNORMAL LOW (ref 45–182)
Saturation Ratios: 14 % — ABNORMAL LOW (ref 17.9–39.5)
TIBC: 305 ug/dL (ref 250–450)
UIBC: 263 ug/dL

## 2023-08-11 LAB — CBC (CANCER CENTER ONLY)
HCT: 28.8 % — ABNORMAL LOW (ref 39.0–52.0)
Hemoglobin: 9.3 g/dL — ABNORMAL LOW (ref 13.0–17.0)
MCH: 27.1 pg (ref 26.0–34.0)
MCHC: 32.3 g/dL (ref 30.0–36.0)
MCV: 84 fL (ref 80.0–100.0)
Platelet Count: 213 K/uL (ref 150–400)
RBC: 3.43 MIL/uL — ABNORMAL LOW (ref 4.22–5.81)
RDW: 15.9 % — ABNORMAL HIGH (ref 11.5–15.5)
WBC Count: 4.5 K/uL (ref 4.0–10.5)
nRBC: 0 % (ref 0.0–0.2)

## 2023-08-11 LAB — FERRITIN: Ferritin: 40 ng/mL (ref 24–336)

## 2023-08-11 NOTE — Progress Notes (Signed)
 Hematology/Oncology Consult note [facility]  Telephone:([phone] [phone]  Patient Care Team: Patient, No Pcp Per as PCP - General (General Practice) Moishe Suzen LABOR, NP (Inactive) as Nurse Practitioner (Nurse Practitioner) [place], Teodoro K, MD as Consulting Physician (Gastroenterology) Melanee Annah BROCKS, MD as Consulting Physician (Oncology)   Name of the patient: Tommy Heath  [id number]  [birth year]   Date of visit: 08/11/23  Diagnosis-anemia of chronic kidney disease along with a component of iron  deficiency  Chief complaint/ Reason for visit-routine follow-up of anemia  Heme/Onc history: Patient is a 89 year old male who has a history of 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  Also noted to have nonbleeding colonic angiectasia in 2017 treated with APC.  He has received IV iron  in the past.  He also has stage III/stage IV chronic kidney disease.  He has not required any EPO so far   Interval history-overall he is doing well and denies any blood loss in his stool or urine.  He has ongoing fatigue.    ECOG PS- 1 Pain scale- 0   Review of systems- Review of Systems  Constitutional:  Positive for malaise/fatigue. Negative for chills, fever and weight loss.  HENT:  Negative for congestion, ear discharge and nosebleeds.   Eyes:  Negative for blurred vision.  Respiratory:  Negative for cough, hemoptysis, sputum production, shortness of breath and wheezing.   Cardiovascular:  Negative for chest pain, palpitations, orthopnea and claudication.  Gastrointestinal:  Negative for abdominal pain, blood in stool, constipation, diarrhea, heartburn, melena, nausea and vomiting.  Genitourinary:  Negative for dysuria, flank pain, frequency, hematuria and urgency.  Musculoskeletal:  Negative for back pain, joint pain and myalgias.  Skin:  Negative for rash.  Neurological:  Negative for dizziness, tingling, focal  weakness, seizures, weakness and headaches.  Endo/Heme/Allergies:  Does not bruise/bleed easily.  Psychiatric/Behavioral:  Negative for depression and suicidal ideas. The patient does not have insomnia.       Not on File   Past Medical History:  Diagnosis Date   Allergic rhinitis    Anemia    AVM (arteriovenous malformation) of colon    BPH (benign prostatic hyperplasia)    Cataract cortical, senile    Colon polyps 02/2005, 2012   TUBULAR ADENOMA   Diabetes mellitus type 2, uncomplicated (HCC)    Duodenal ulcer    w/ gastric ulcer   Glaucoma    H. pylori infection    H. pylori infection    History of blood product transfusion    History of blood transfusion 02/2005   hgb was 6   History of duodenal ulcer 02/2005   The lesion was 5 mm in largest dimension.   Hyperlipidemia    Hypertension    Sleep apnea    BiPap     Past Surgical History:  Procedure Laterality Date   COLONOSCOPY     07/2010, 09/09/2002, 10/15/2002, 02/16/2005, Adenomatous polyps, repeat 5 years, Dr Viktoria   COLONOSCOPY WITH PROPOFOL  N/A 03/01/2015   Procedure: COLONOSCOPY WITH PROPOFOL ;  Surgeon: Lamar ONEIDA Viktoria, MD;  Location: [facility] ENDOSCOPY;  Service: Endoscopy;  Laterality: N/A;   ESOPHAGOGASTRODUODENOSCOPY     08/17/2010, 02/16/2005, no repeat per RTE   ESOPHAGOGASTRODUODENOSCOPY (EGD) WITH PROPOFOL  N/A 03/01/2015   Procedure: ESOPHAGOGASTRODUODENOSCOPY (EGD) WITH PROPOFOL ;  Surgeon: Lamar ONEIDA Viktoria, MD;  Location: [facility] ENDOSCOPY;  Service: Endoscopy;  Laterality: N/A;   ESOPHAGOGASTRODUODENOSCOPY (EGD) WITH PROPOFOL  N/A 05/15/2016   Procedure: ESOPHAGOGASTRODUODENOSCOPY (  EGD) WITH PROPOFOL ;  Surgeon: Viktoria Lamar DASEN, MD;  Location: [facility] ENDOSCOPY;  Service: Endoscopy;  Laterality: N/A;    Social History   Socioeconomic History   Marital status: Single    Spouse name: Not on file   Number of children: Not on file   Years of education: Not on file   Highest education level: Not on file  Occupational  History   Not on file  Tobacco Use   Smoking status: Former    Current packs/day: 0.00    Average packs/day: 1 pack/day for 40.0 years (40.0 ttl pk-yrs)    Types: Cigarettes    Start date: 01/07/1953    Quit date: 01/07/1993    Years since quitting: 30.6   Smokeless tobacco: Never  Vaping Use   Vaping status: Never Used  Substance and Sexual Activity   Alcohol use: No    Alcohol/week: 0.0 standard drinks of alcohol   Drug use: No   Sexual activity: Never  Other Topics Concern   Not on file  Social History Narrative   Not on file   Social Drivers of Health   Financial Resource Strain: Low Risk  (11/20/2016)   Received [facility]   Overall Financial Resource Strain (CARDIA)    Difficulty of Paying Living Expenses: Not very hard  Food Insecurity: No Food Insecurity (11/20/2016)   Received from [facility]   Hunger Vital Sign    Worried About Running Out of Food in the Last Year: Never true    Ran Out of Food in the Last Year: Never true  Transportation Needs: No Transportation Needs (11/20/2016)   Received from [facility]   PRAPARE - Transportation    Lack of Transportation (Medical): No    Lack of Transportation (Non-Medical): No  Physical Activity: Insufficiently Active (11/20/2016)   Received from [facility]   Exercise Vital Sign    Days of Exercise per Week: 1 day    Minutes of Exercise per Session: 30 min  Stress: No Stress Concern Present (11/20/2016)   Received from [facility] - Occupational Stress Questionnaire    Feeling of Stress : Not at all  Social Connections: Moderately Integrated (11/20/2016)   Received from [facility]   Social Connection and Isolation Panel    Frequency of Communication with Friends and Family: More than three times a week    Frequency of Social Gatherings with Friends and Family:  More than three times a week    Attends Religious Services: More than 4 times per year    Active Member of [employer] or Organizations: Yes    Attends Engineer, structural: More than 4 times per year    Marital Status: Divorced  Catering manager Violence: Not on file    Family History  Problem Relation Age of Onset   Diabetes Mother    Breast cancer Sister    Prostate cancer Neg Hx    Bladder Cancer Neg Hx    Kidney cancer Neg Hx      Current Outpatient Medications:    amLODipine (NORVASC) 10 MG tablet, Take 10 mg by mouth daily. , Disp: , Rfl:    atenolol (TENORMIN) 50 MG tablet, TAKE ONE TABLET BY MOUTH EVERY MORNING FOR BLOOD PRESSURE, Disp: , Rfl:    carvedilol (COREG) 3.125 MG tablet, Take 3.125 mg by mouth., Disp: , Rfl:    Cholecalciferol 50  MCG (2000 UT) TABS, Take by mouth., Disp: , Rfl:    cyanocobalamin 1000 MCG tablet, Take 1 tablet by mouth every morning., Disp: , Rfl:    Docusate Sodium (DSS) 100 MG CAPS, Take 100 mg by mouth., Disp: , Rfl:    ferrous sulfate 325 (65 FE) MG tablet, Take 1 tablet by mouth 2 (two) times daily with a meal., Disp: , Rfl:    finasteride  (PROSCAR ) 5 MG tablet, Take 1 tablet (5 mg total) by mouth daily., Disp: 90 tablet, Rfl: 3   glimepiride (AMARYL) 2 MG tablet, Take 1 tablet by mouth every morning., Disp: , Rfl:    glipiZIDE (GLUCOTROL) 10 MG tablet, Take 10 mg by mouth daily before breakfast., Disp: , Rfl:    ibuprofen  (ADVIL ) 600 MG tablet, Take 1 tablet (600 mg total) by mouth every 6 (six) hours as needed., Disp: 30 tablet, Rfl: 0   pioglitazone (ACTOS) 30 MG tablet, Take 45 mg by mouth daily. , Disp: , Rfl:    ramipril (ALTACE) 10 MG capsule, Take 10 mg by mouth daily., Disp: , Rfl:    rosuvastatin (CRESTOR) 20 MG tablet, TAKE ONE-HALF TABLET BY MOUTH AT BEDTIME FOR HIGH BLOOD CHOLESTEROL, Disp: , Rfl:    sitaGLIPtin (JANUVIA) 100 MG tablet, Take 100 mg by mouth daily. , Disp: , Rfl:    tamsulosin  (FLOMAX ) 0.4 MG CAPS capsule, Take  0.4 mg by mouth daily., Disp: , Rfl:    telmisartan (MICARDIS) 20 MG tablet, Take 20 mg by mouth daily. for blood pressure, Disp: , Rfl:    trospium (SANCTURA) 20 MG tablet, Take by mouth., Disp: , Rfl:    baclofen  (LIORESAL ) 10 MG tablet, Take 1 tablet (10 mg total) by mouth 3 (three) times daily. (Patient not taking: Reported on 08/11/2023), Disp: 30 each, Rfl: 0   latanoprost (XALATAN) 0.005 % ophthalmic solution, Place 1 drop into both eyes at bedtime. (Patient not taking: Reported on 08/11/2023), Disp: , Rfl:  No current facility-administered medications for this visit.  Facility-Administered Medications Ordered in Other Visits:    iron  sucrose (VENOFER ) injection 200 mg, 200 mg, Intravenous, Once, Brahmanday, Govinda R, MD  Physical exam:  Vitals:   08/11/23 1131  BP: 117/78  Pulse: 84  Resp: 18  Temp: 97.6 F (36.4 C)  TempSrc: Tympanic  SpO2: 97%  Weight: 158 lb 11.2 oz (72 kg)  Height: 5' 6 (1.676 m)   Physical Exam Cardiovascular:     Rate and Rhythm: Normal rate and regular rhythm.     Heart sounds: Normal heart sounds.  Pulmonary:     Effort: Pulmonary effort is normal.     Breath sounds: Normal breath sounds.  Skin:    General: Skin is warm and dry.  Neurological:     Mental Status: He is alert and oriented to person, place, and time.      I have personally reviewed labs listed below:    Latest Ref Rng & Units 07/18/2023   10:39 AM  CMP  Glucose 70 - 99 mg/dL 582   BUN 8 - 23 mg/dL 33   Creatinine 9.38 - 1.24 mg/dL 7.57   Sodium 864 - 854 mmol/L 135   Potassium 3.5 - 5.1 mmol/L 3.7   Chloride 98 - 111 mmol/L 103   CO2 22 - 32 mmol/L 24   Calcium 8.9 - 10.3 mg/dL 8.8   Total Protein 6.5 - 8.1 g/dL 6.9   Total Bilirubin 0.0 - 1.2 mg/dL 0.6   Alkaline Phos  38 - 126 U/L 47   AST 15 - 41 U/L 12   ALT 0 - 44 U/L 11       Latest Ref Rng & Units 08/11/2023   11:10 AM  CBC  WBC 4.0 - 10.5 K/uL 4.5   Hemoglobin 13.0 - 17.0 g/dL 9.3   Hematocrit 60.9 - 52.0 %  28.8   Platelets 150 - 400 K/uL 213       Assessment and plan- Patient is a 89 y.o. male here for routine f/u of anemia of chronic kidney disease  Patient's baseline hemoglobin runs Around 9.5 and has been stable in this range over the last 1 year.  Today his hemoglobin is 9.3.  Ferritin levels are low at 40 as compared to 129 6 months ago and iron  saturation at 14%.  He will therefore proceed with 2 doses of Feraheme  at this time.  Discussed risks and benefits of IV iron  including all but not limited to possible risk of infusion and anaphylactic reaction.  Patient understands and agrees to proceed as planned.  He has tolerated Feraheme  well in the past.  CBC ferritin and iron  studies in 4 and 8 months and I will see him back in 8 months   Visit Diagnosis 1. Iron  deficiency anemia due to chronic blood loss      Dr. Annah Skene, MD, MPH [facility] at [facility] [phone] 08/11/2023 4:25 PM

## 2023-08-11 NOTE — Progress Notes (Signed)
 Patient reports he's doing and feeling well today with no new or acute concerns.

## 2023-08-20 ENCOUNTER — Inpatient Hospital Stay

## 2023-08-20 VITALS — BP 143/55 | HR 61 | Temp 97.3°F | Resp 18

## 2023-08-20 DIAGNOSIS — D500 Iron deficiency anemia secondary to blood loss (chronic): Secondary | ICD-10-CM

## 2023-08-20 MED ORDER — SODIUM CHLORIDE 0.9 % IV SOLN
510.0000 mg | INTRAVENOUS | Status: DC
  Administered 2023-08-20 (×2): 510 mg via INTRAVENOUS
  Filled 2023-08-20: qty 510

## 2023-08-20 MED ORDER — SODIUM CHLORIDE 0.9 % IV SOLN
Freq: Once | INTRAVENOUS | Status: AC
  Filled 2023-08-20: qty 250

## 2023-08-27 ENCOUNTER — Other Ambulatory Visit: Payer: Self-pay | Admitting: Oncology

## 2023-08-27 ENCOUNTER — Inpatient Hospital Stay

## 2023-08-27 VITALS — BP 157/70 | HR 72 | Temp 97.2°F | Resp 18

## 2023-08-27 DIAGNOSIS — D500 Iron deficiency anemia secondary to blood loss (chronic): Secondary | ICD-10-CM | POA: Diagnosis not present

## 2023-08-27 MED ORDER — SODIUM CHLORIDE 0.9 % IV SOLN
510.0000 mg | INTRAVENOUS | Status: DC
  Administered 2023-08-27: 510 mg via INTRAVENOUS
  Filled 2023-08-27: qty 510

## 2023-08-27 MED ORDER — SODIUM CHLORIDE 0.9 % IV SOLN
INTRAVENOUS | Status: DC
  Filled 2023-08-27: qty 250

## 2023-08-27 NOTE — Patient Instructions (Signed)

## 2023-12-11 ENCOUNTER — Inpatient Hospital Stay

## 2024-04-12 ENCOUNTER — Ambulatory Visit: Admitting: Oncology

## 2024-04-12 ENCOUNTER — Other Ambulatory Visit
